# Patient Record
Sex: Male | Born: 1940 | Race: White | Hispanic: No | Smoking: Never smoker
Health system: Southern US, Community
[De-identification: ages and names within clinical notes are randomized; demographics above are authoritative.]

## PROBLEM LIST (undated history)

## (undated) DIAGNOSIS — C61 Malignant neoplasm of prostate: Secondary | ICD-10-CM

## (undated) DIAGNOSIS — J302 Other seasonal allergic rhinitis: Secondary | ICD-10-CM

## (undated) DIAGNOSIS — T7840XA Allergy, unspecified, initial encounter: Secondary | ICD-10-CM

## (undated) DIAGNOSIS — K219 Gastro-esophageal reflux disease without esophagitis: Secondary | ICD-10-CM

## (undated) DIAGNOSIS — I10 Essential (primary) hypertension: Secondary | ICD-10-CM

## (undated) DIAGNOSIS — F419 Anxiety disorder, unspecified: Secondary | ICD-10-CM

## (undated) DIAGNOSIS — K649 Unspecified hemorrhoids: Secondary | ICD-10-CM

## (undated) DIAGNOSIS — J329 Chronic sinusitis, unspecified: Secondary | ICD-10-CM

## (undated) DIAGNOSIS — R339 Retention of urine, unspecified: Secondary | ICD-10-CM

## (undated) DIAGNOSIS — E785 Hyperlipidemia, unspecified: Secondary | ICD-10-CM

## (undated) DIAGNOSIS — K5790 Diverticulosis of intestine, part unspecified, without perforation or abscess without bleeding: Secondary | ICD-10-CM

## (undated) HISTORY — DX: Gastro-esophageal reflux disease without esophagitis: K21.9

## (undated) HISTORY — DX: Unspecified hemorrhoids: K64.9

## (undated) HISTORY — DX: Essential (primary) hypertension: I10

## (undated) HISTORY — DX: Anxiety disorder, unspecified: F41.9

## (undated) HISTORY — DX: Hyperlipidemia, unspecified: E78.5

## (undated) HISTORY — DX: Malignant neoplasm of prostate: C61

## (undated) HISTORY — PX: PROSTATECTOMY: SHX69

## (undated) HISTORY — DX: Allergy, unspecified, initial encounter: T78.40XA

## (undated) HISTORY — DX: Chronic sinusitis, unspecified: J32.9

## (undated) HISTORY — DX: Retention of urine, unspecified: R33.9

## (undated) HISTORY — DX: Diverticulosis of intestine, part unspecified, without perforation or abscess without bleeding: K57.90

---

## 1997-11-06 ENCOUNTER — Ambulatory Visit: Admission: RE | Admit: 1997-11-06 | Discharge: 1997-11-06 | Payer: Self-pay | Admitting: *Deleted

## 1999-01-03 ENCOUNTER — Encounter: Payer: Self-pay | Admitting: *Deleted

## 1999-01-03 ENCOUNTER — Ambulatory Visit (HOSPITAL_COMMUNITY): Admission: RE | Admit: 1999-01-03 | Discharge: 1999-01-03 | Payer: Self-pay | Admitting: *Deleted

## 1999-05-13 ENCOUNTER — Ambulatory Visit: Admission: RE | Admit: 1999-05-13 | Discharge: 1999-05-13 | Payer: Self-pay | Admitting: *Deleted

## 2000-05-11 ENCOUNTER — Ambulatory Visit (HOSPITAL_COMMUNITY): Admission: RE | Admit: 2000-05-11 | Discharge: 2000-05-11 | Payer: Self-pay | Admitting: *Deleted

## 2001-05-10 ENCOUNTER — Ambulatory Visit: Admission: RE | Admit: 2001-05-10 | Discharge: 2001-08-08 | Payer: Self-pay | Admitting: *Deleted

## 2002-07-21 ENCOUNTER — Encounter (INDEPENDENT_AMBULATORY_CARE_PROVIDER_SITE_OTHER): Payer: Self-pay | Admitting: Specialist

## 2002-07-21 ENCOUNTER — Ambulatory Visit (HOSPITAL_COMMUNITY): Admission: RE | Admit: 2002-07-21 | Discharge: 2002-07-21 | Payer: Self-pay | Admitting: Gastroenterology

## 2003-04-16 ENCOUNTER — Ambulatory Visit (HOSPITAL_COMMUNITY): Admission: RE | Admit: 2003-04-16 | Discharge: 2003-04-16 | Payer: Self-pay | Admitting: Gastroenterology

## 2003-08-10 ENCOUNTER — Emergency Department (HOSPITAL_COMMUNITY): Admission: EM | Admit: 2003-08-10 | Discharge: 2003-08-10 | Payer: Self-pay | Admitting: Emergency Medicine

## 2004-02-08 ENCOUNTER — Emergency Department (HOSPITAL_COMMUNITY): Admission: EM | Admit: 2004-02-08 | Discharge: 2004-02-08 | Payer: Self-pay | Admitting: Emergency Medicine

## 2004-02-09 ENCOUNTER — Emergency Department (HOSPITAL_COMMUNITY): Admission: EM | Admit: 2004-02-09 | Discharge: 2004-02-09 | Payer: Self-pay | Admitting: *Deleted

## 2004-02-29 ENCOUNTER — Emergency Department (HOSPITAL_COMMUNITY): Admission: EM | Admit: 2004-02-29 | Discharge: 2004-02-29 | Payer: Self-pay | Admitting: Emergency Medicine

## 2004-03-28 ENCOUNTER — Ambulatory Visit (HOSPITAL_COMMUNITY): Admission: RE | Admit: 2004-03-28 | Discharge: 2004-03-28 | Payer: Self-pay | Admitting: Urology

## 2004-03-28 ENCOUNTER — Ambulatory Visit (HOSPITAL_BASED_OUTPATIENT_CLINIC_OR_DEPARTMENT_OTHER): Admission: RE | Admit: 2004-03-28 | Discharge: 2004-03-28 | Payer: Self-pay | Admitting: Urology

## 2004-04-15 ENCOUNTER — Emergency Department (HOSPITAL_COMMUNITY): Admission: EM | Admit: 2004-04-15 | Discharge: 2004-04-15 | Payer: Self-pay | Admitting: Emergency Medicine

## 2005-10-06 ENCOUNTER — Ambulatory Visit: Payer: Self-pay | Admitting: Family Medicine

## 2006-02-12 ENCOUNTER — Ambulatory Visit: Payer: Self-pay | Admitting: Family Medicine

## 2006-05-04 ENCOUNTER — Ambulatory Visit: Payer: Self-pay | Admitting: Family Medicine

## 2007-02-04 ENCOUNTER — Ambulatory Visit: Payer: Self-pay | Admitting: Family Medicine

## 2007-04-22 ENCOUNTER — Ambulatory Visit: Payer: Self-pay | Admitting: Family Medicine

## 2007-06-23 ENCOUNTER — Ambulatory Visit: Payer: Self-pay | Admitting: Family Medicine

## 2007-08-23 ENCOUNTER — Ambulatory Visit: Payer: Self-pay | Admitting: Family Medicine

## 2007-10-07 ENCOUNTER — Ambulatory Visit: Payer: Self-pay | Admitting: Family Medicine

## 2007-11-22 ENCOUNTER — Ambulatory Visit: Payer: Self-pay | Admitting: Family Medicine

## 2007-12-05 ENCOUNTER — Ambulatory Visit: Payer: Self-pay | Admitting: Family Medicine

## 2007-12-20 ENCOUNTER — Ambulatory Visit: Payer: Self-pay | Admitting: Family Medicine

## 2007-12-22 ENCOUNTER — Ambulatory Visit: Payer: Self-pay | Admitting: Family Medicine

## 2007-12-23 ENCOUNTER — Emergency Department (HOSPITAL_COMMUNITY): Admission: EM | Admit: 2007-12-23 | Discharge: 2007-12-23 | Payer: Self-pay | Admitting: Emergency Medicine

## 2008-01-02 ENCOUNTER — Ambulatory Visit: Payer: Self-pay | Admitting: Family Medicine

## 2008-01-17 ENCOUNTER — Ambulatory Visit: Payer: Self-pay | Admitting: Family Medicine

## 2008-01-31 ENCOUNTER — Ambulatory Visit: Payer: Self-pay | Admitting: Family Medicine

## 2008-02-05 ENCOUNTER — Emergency Department (HOSPITAL_COMMUNITY): Admission: EM | Admit: 2008-02-05 | Discharge: 2008-02-05 | Payer: Self-pay | Admitting: Emergency Medicine

## 2008-02-27 ENCOUNTER — Ambulatory Visit: Payer: Self-pay | Admitting: Family Medicine

## 2008-03-28 ENCOUNTER — Ambulatory Visit: Payer: Self-pay | Admitting: Family Medicine

## 2008-12-03 ENCOUNTER — Ambulatory Visit: Payer: Self-pay | Admitting: Family Medicine

## 2008-12-15 ENCOUNTER — Emergency Department (HOSPITAL_COMMUNITY): Admission: EM | Admit: 2008-12-15 | Discharge: 2008-12-15 | Payer: Self-pay | Admitting: Emergency Medicine

## 2008-12-19 ENCOUNTER — Ambulatory Visit: Payer: Self-pay | Admitting: Family Medicine

## 2009-02-07 ENCOUNTER — Ambulatory Visit: Payer: Self-pay | Admitting: Family Medicine

## 2010-01-21 ENCOUNTER — Ambulatory Visit: Payer: Self-pay | Admitting: Family Medicine

## 2010-05-13 ENCOUNTER — Ambulatory Visit
Admission: RE | Admit: 2010-05-13 | Discharge: 2010-05-13 | Payer: Self-pay | Source: Home / Self Care | Attending: Family Medicine | Admitting: Family Medicine

## 2010-07-26 LAB — CBC
Platelets: 233 10*3/uL (ref 150–400)
RBC: 4.71 MIL/uL (ref 4.22–5.81)
WBC: 10.7 10*3/uL — ABNORMAL HIGH (ref 4.0–10.5)

## 2010-07-26 LAB — BASIC METABOLIC PANEL
BUN: 17 mg/dL (ref 6–23)
CO2: 24 mEq/L (ref 19–32)
Calcium: 8.9 mg/dL (ref 8.4–10.5)
Chloride: 101 mEq/L (ref 96–112)
Creatinine, Ser: 0.78 mg/dL (ref 0.4–1.5)
GFR calc Af Amer: >60 mL/min (ref >60)
GFR calc non Af Amer: >60 mL/min (ref >60)
Glucose, Bld: 145 mg/dL — ABNORMAL HIGH (ref 70–99)
Potassium: 3.3 mEq/L — ABNORMAL LOW (ref 3.5–5.1)
Sodium: 135 mEq/L (ref 135–145)

## 2010-07-26 LAB — DIFFERENTIAL
Eosinophils Absolute: 0.1 10*3/uL (ref 0.0–0.7)
Lymphs Abs: 2.8 10*3/uL (ref 0.7–4.0)
Monocytes Relative: 7 % (ref 3–12)
Neutro Abs: 7 10*3/uL (ref 1.7–7.7)
Neutrophils Relative %: 66 % (ref 43–77)

## 2010-07-26 LAB — URINALYSIS, ROUTINE W REFLEX MICROSCOPIC
Bilirubin Urine: NEGATIVE
Ketones, ur: NEGATIVE mg/dL
Nitrite: NEGATIVE
Specific Gravity, Urine: 1.019 (ref 1.005–1.030)
Urobilinogen, UA: 0.2 mg/dL (ref 0.0–1.0)
pH: 5 (ref 5.0–8.0)

## 2010-07-26 LAB — PROTIME-INR
INR: 1 (ref 0.00–1.49)
Prothrombin Time: 13.1 seconds (ref 11.6–15.2)

## 2010-07-26 LAB — HEMOCCULT GUIAC POC 1CARD (OFFICE): Fecal Occult Bld: NEGATIVE

## 2010-07-30 IMAGING — CR DG CHEST 2V
2 series · 2 of 2 positions shown · non-contrast
Comparison: None

CLINICAL DATA: Weakness, chills, difficulty breathing

CHEST - 2 VIEW

[w chest pa]
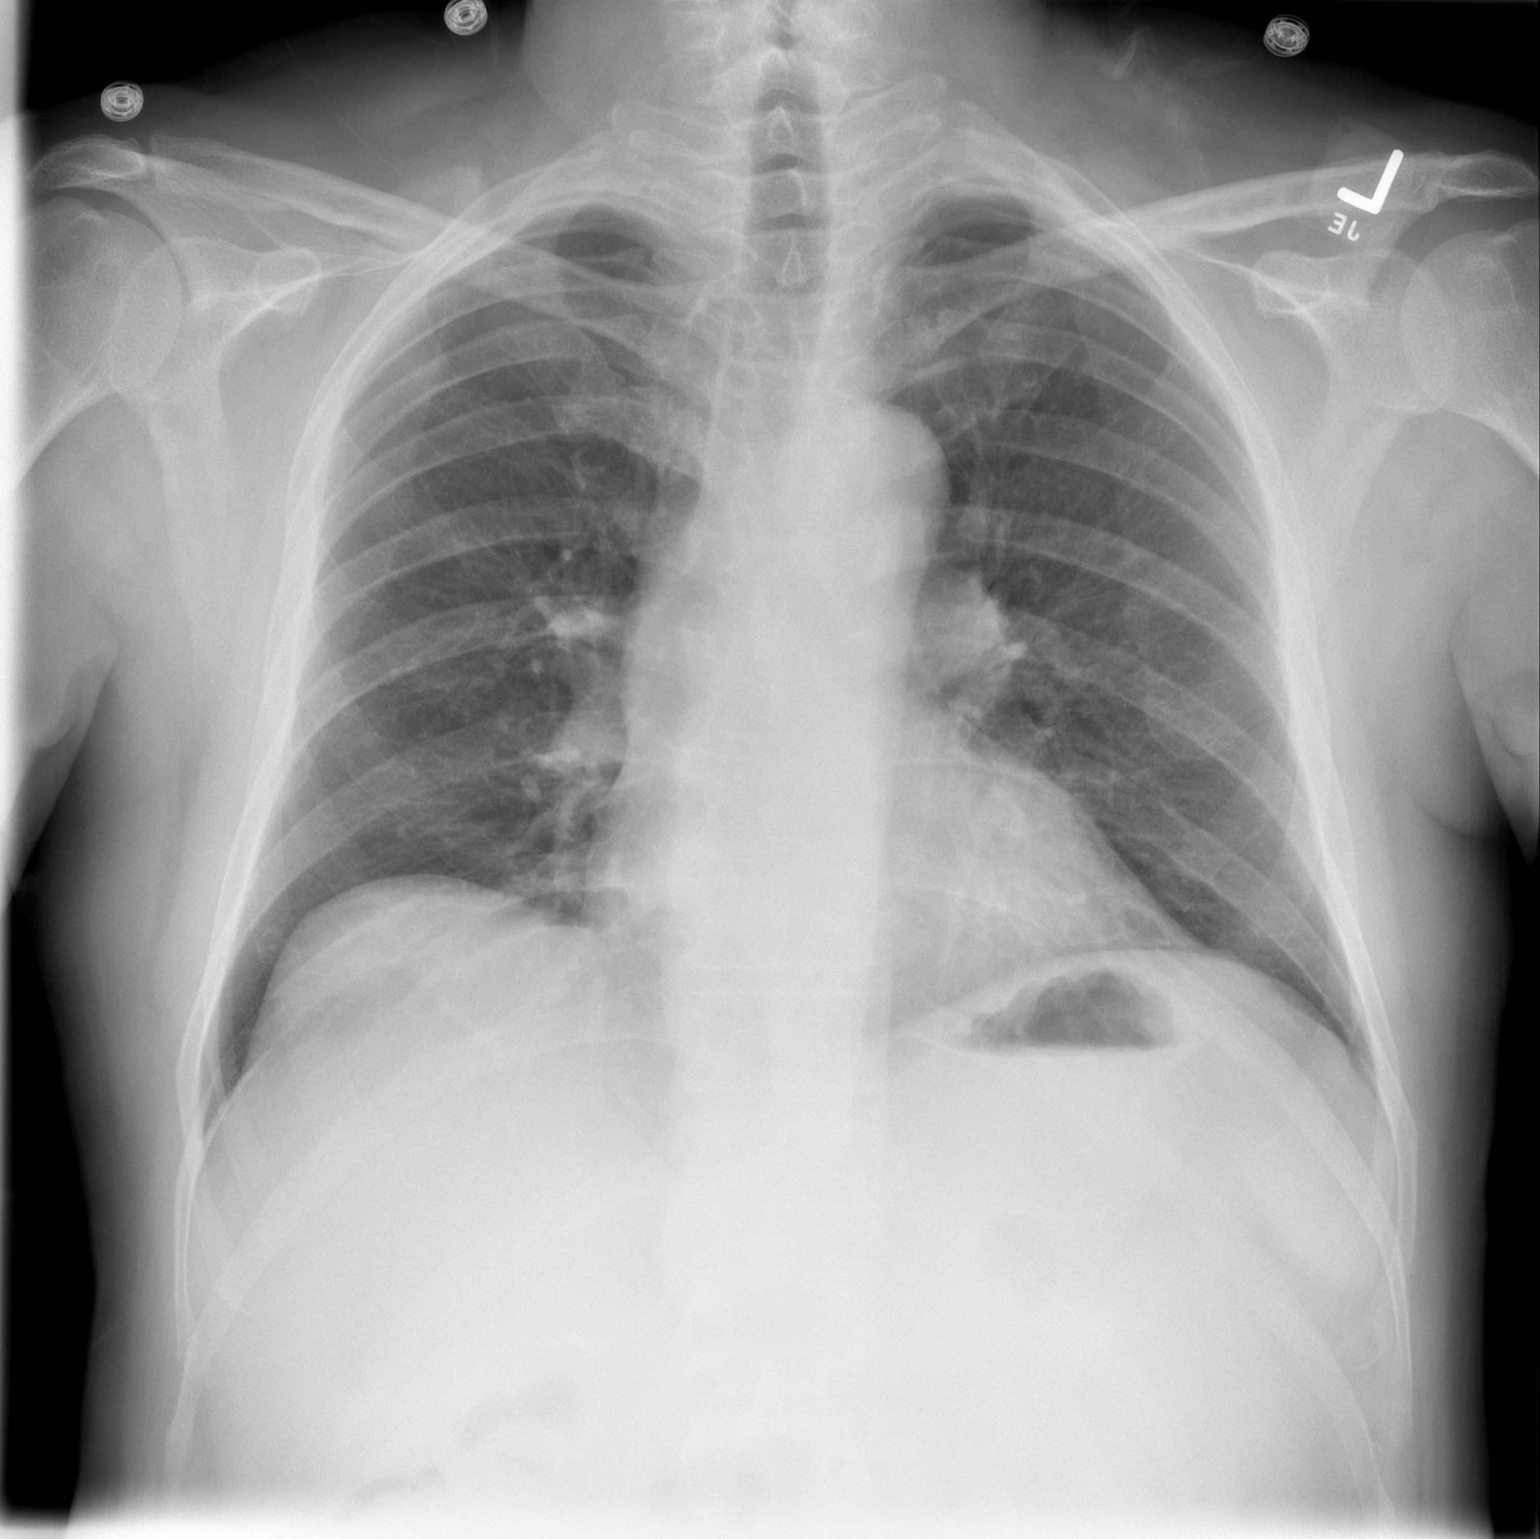

[w chest lat]
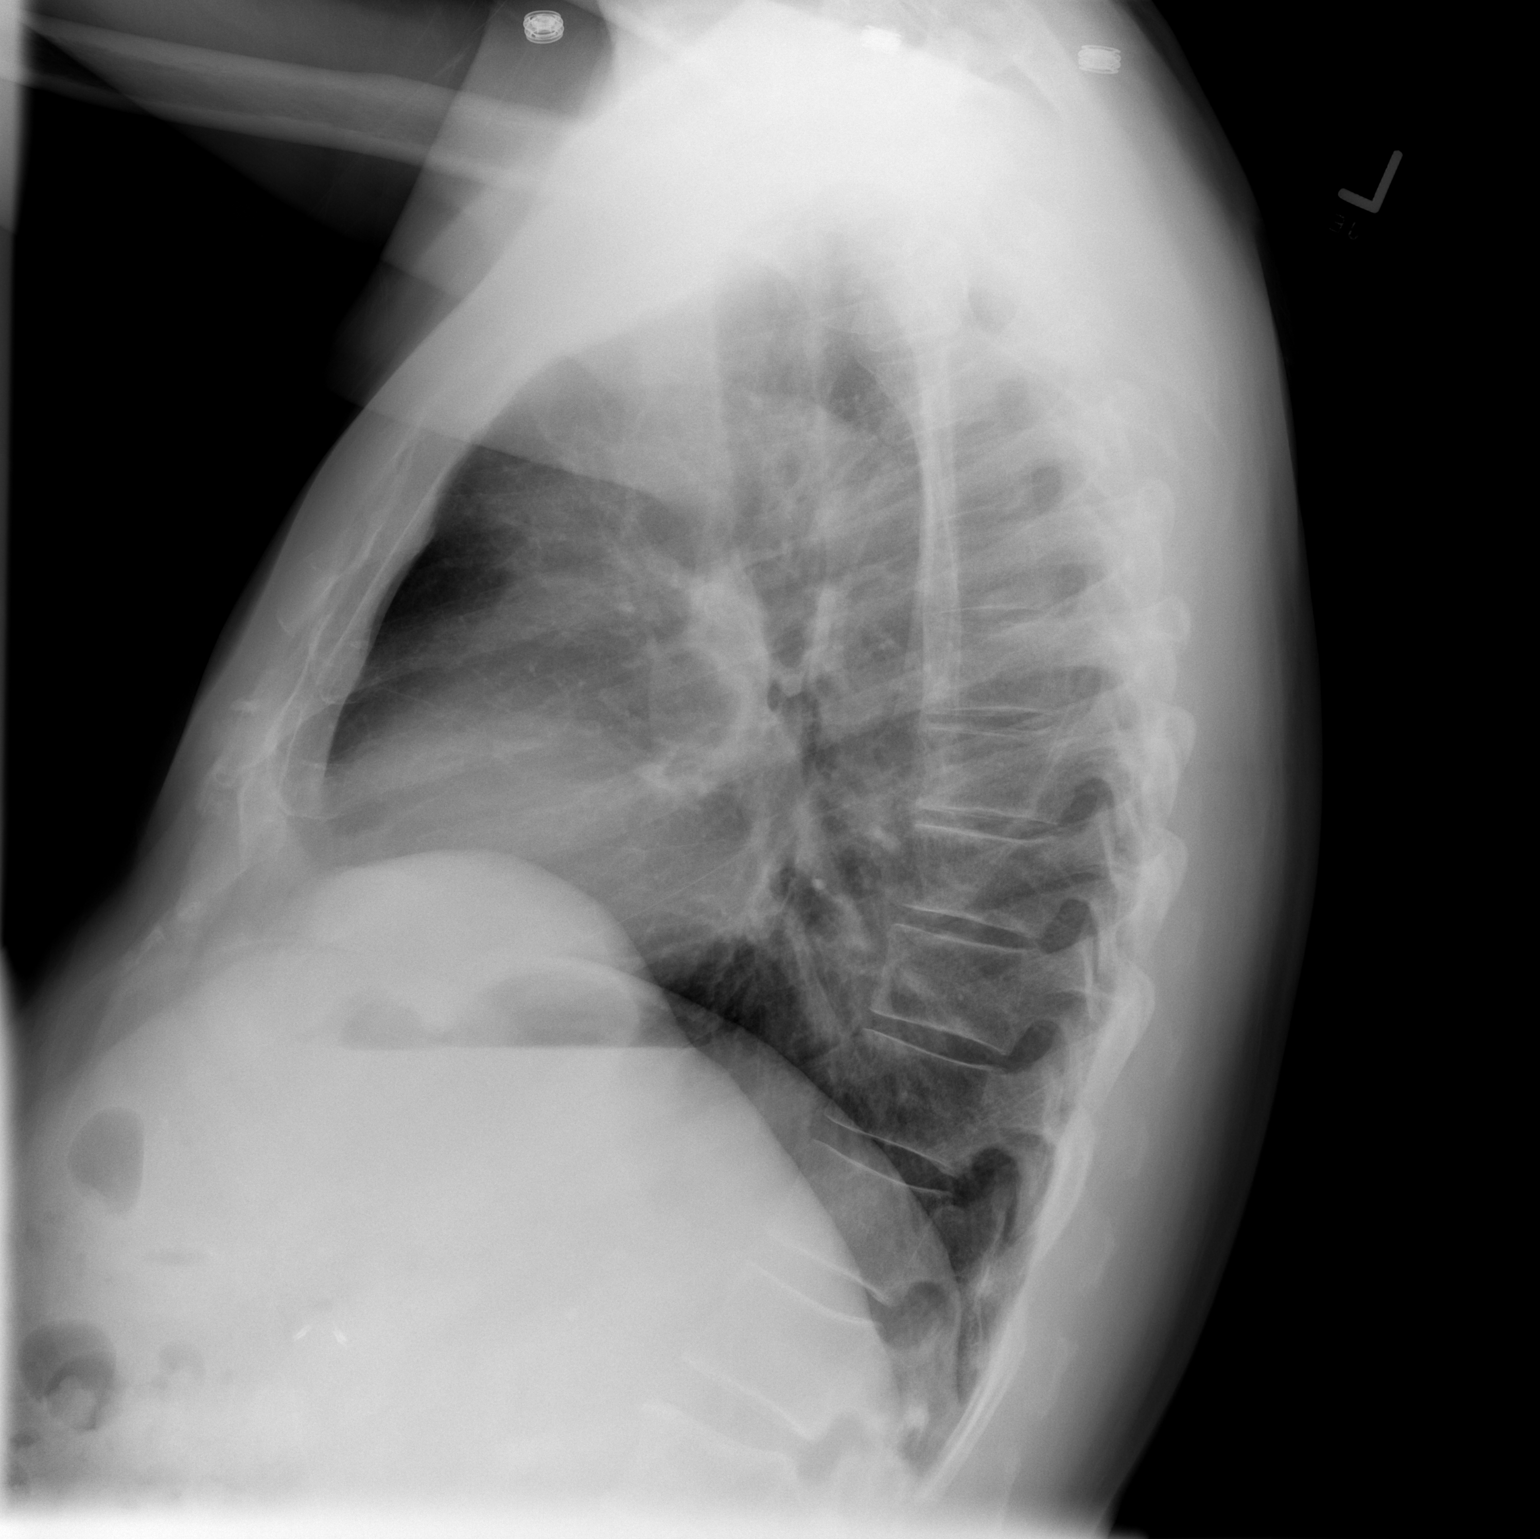

[2 of 2 positions shown; findings below may reference images not displayed]

FINDINGS: Two views of the chest show no active infiltrate or
effusion.  The heart is within normal limits in size.  No bony
abnormality is seen.
IMPRESSION: No active lung disease.

## 2010-09-05 NOTE — Op Note (Signed)
NAME:  Matthew Mckay, Matthew Mckay NO.:  [id number]   MEDICAL RECORD NO.:  [id number]          PATIENT TYPE:  AMB   LOCATION:  [facility]                         FACILITY:  [facility]   PHYSICIAN:  Lindaann Slough, M.D.  DATE OF BIRTH:  06-20-1940   DATE OF PROCEDURE:  03/28/2004  DATE OF DISCHARGE:                                 OPERATIVE REPORT   PREOPERATIVE. DIAGNOSES:  Urethral stricture and carcinoma of prostate.   POSTOPERATIVE DIAGNOSES:  Urethral stricture and carcinoma of prostate.   PROCEDURE:  Cystoscopy, urethral dilation.   SURGEON:  Lindaann Slough, M.D.   ANESTHESIA:  General.   INDICATIONS FOR PROCEDURE:  The patient is a 70 year old male who was  treated with radiation for adenocarcinoma of prostate in 78 in [place].  He was seen in the emergency room on February 09, 2004 in urinary retention.  He was then found to have a urethral stricture.  The stricture was then  dilated and the Foley catheter was left in place.  After removing the Foley  catheter, he started having difficulty voiding and catheter had to be  reinserted.  He is scheduled today for cystoscopy and visual urethrotomy.   Under general anesthesia, the patient was prepped and draped and placed in  the dorsal lithotomy position.  A #22 Wappler cystoscope was inserted in the  bladder.  The patient had a stricture in the bulbous urethra but the  cystoscope could be passed in the bladder. The patient had a previous TURP  and the bladder neck is open. The bladder is moderately trabeculated.  There  is no stone or tumor in the bladder.  The ureteral orifices are in normal  position and shape with clear efflux.  The cystoscope was then removed. The  urethra was dilated with Sissy Hoff sounds up to #28 [place] and then a #22  [place] Foley catheter was inserted without difficulty in the bladder.   The patient tolerated the procedure well and left the OR in satisfactory  condition to post anesthesia care  unit.      MN/MEDQ  D:  03/28/2004  T:  03/28/2004  Job:  [id number]   cc:   Sharlot Gowda, M.D.  [address]  [place], [place] [address]  Fax: [phone]

## 2010-09-05 NOTE — Consult Note (Signed)
NAME:  Matthew Mckay, Matthew Mckay NO.:  [id number]   MEDICAL RECORD NO.:  [id number]          PATIENT TYPE:  EMS   LOCATION:  [facility]                         FACILITY:  [facility]   PHYSICIAN:  Lindaann Slough, M.D.  DATE OF BIRTH:  1940/05/09   DATE OF CONSULTATION:  DATE OF DISCHARGE:  02/09/2004                                   CONSULTATION   DATE OF CONSULTATION:  February 09, 2004.   REASON FOR CONSULTATION:  Inability to insert a Foley catheter.   HISTORY OF PRESENT ILLNESS:  The patient is a 70 year old male, who had been  having difficulty voiding for the past few days.  He was seen in the ER last  night and sent home with an SMX/TMP and Pyridium for a urinary tract  infection.  However, he continued to have dribbling and the sensation of  incomplete emptying of the bladder, and he returned to the emergency room.  He was found on physical examination to have a distended bladder.  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  However, they were able to pass a #14 Foley toward the  meatus, but could not pass it in the bladder.  I was then asked to come in  and evaluate the patient.   PAST MEDICAL HISTORY:  Radical prostatectomy about 10 years ago.   I dilated the meatus with Sissy Hoff sounds up to #18 [place] and then I  passed a #16 Coude catheter in the urethra, but there was obstruction at the  bladder neck.  Then, I passed a flexible cystoscope through the meatus.  There was a stricture at the bladder neck, and I passed a guidewire toward  the cystoscope and toward the stricture into the bladder.  The cystoscope  was then removed.  I dilated the bladder neck with Hayman's dilators up to  #18 [place].  Then, a #16 Ainsworth catheter was passed over the guidewire.  700 mL of urine were then drained out of the bladder.  The catheter was left  indwelling.   IMPRESSION:  1.  Urinary retention.  2.  Bladder neck  contracture.  3.  Status post radicle prostatectomy for adenocarcinoma of prostate      followed by external beam radiotherapy.   PLAN:  Leave Foley catheter indwelling.  Continue SMX/TMP and follow up next  week for voiding trial.       MN/MEDQ  D:  02/09/2004  T:  02/09/2004  Job:  [id number]

## 2010-09-05 NOTE — Op Note (Signed)
NAME:  Matthew Mckay, Matthew Mckay NO.:  [id number]   MEDICAL RECORD NO.:  [id number]                   PATIENT TYPE:  AMB   LOCATION:  ENDO                                 FACILITY:  [facility]   PHYSICIAN:  Petra Kuba, M.D.                 DATE OF BIRTH:  1940/12/01   DATE OF PROCEDURE:  07/21/2002  DATE OF DISCHARGE:                                 OPERATIVE REPORT   PROCEDURE:  Colonoscopy with biopsy.   INDICATIONS FOR PROCEDURE:  Bright red blood per rectum. Patient due for  colonic screening.   Consent was signed after risks, benefits, methods, and options were  thoroughly discussed in the office.   MEDICINES USED:  Demerol 100, Versed 12.   DESCRIPTION OF PROCEDURE:  Rectal inspection was pertinent for small  external hemorrhoids. Digital exam was negative. The video colonoscope was  inserted, easily advanced around the colon to the cecum. This did require  some abdominal pressure but  no position changes. On insertion, some left  sided diverticula were seen but no other abnormalities. The cecum was  identified by the appendiceal orifice and the ileocecal valve. The prep was  fairly adequate, did require some washing and suctioning for adequate  visualization.  The scope was slowly withdrawn. The cecum, ascending and  transverse were all normal. The scope was withdrawn around the left side of  the colon, some left sided diverticula were seen. In the mid sigmoid, an  edematous fold was seen. We did not think it was a polyp but went ahead and  took two cold biopsies of it just to make sure. In the distal sigmoid, a  tiny polyp was seen and was cold biopsied x2. The specimens were put in a  separate container. Once back in the rectum, the scope was retroflexed  pertinent for some small internal hemorrhoids, some scarring from previous  hemorrhoidal surgery as well as some minimal radiation proctitis. Anal  rectal pull through straight visualization of the  rectum confirmed the  above. The scope was then straightened and readvanced a short ways up the  left side of the colon, air was suctioned, scope removed. The patient  tolerated the procedure well. There was no obvious or immediate  complications.   ENDOSCOPIC DIAGNOSIS:  1. Internal and external hemorrhoids.  2. Minimal radiation proctitis.  3. Left sided diverticula.  4. Tiny distal sigmoid polyp cold biopsied.  5. Sigmoid edema doubt polyp status post cold biopsied.  6. Otherwise within normal limits to the cecum.    PLAN:  Await pathology and would probably recheck colon screening in five  years. Happy to see back p.r.n. or in two months to recheck symptoms and  make sure no further workup plans are needed. If bleeding picks up and  suppositories are unhelpful might even consider a trial of using the ERBE  argon tissue plasma coagulator, but his radiation damage  does not seem bad  enough to warrant that at the current time.                                               Petra Kuba, M.D.    MEM/MEDQ  D:  07/21/2002  T:  07/22/2002  Job:  [id number]   cc:   Sharlot Gowda, M.D.  [address] [place], [place] [address]  Fax: [phone]

## 2010-09-17 ENCOUNTER — Other Ambulatory Visit: Payer: Self-pay | Admitting: Family Medicine

## 2011-01-20 LAB — DIFFERENTIAL
Basophils Absolute: 0.1
Basophils Relative: 1
Lymphocytes Relative: 24
Neutro Abs: 9.1 — ABNORMAL HIGH
Neutrophils Relative %: 71

## 2011-01-20 LAB — POCT I-STAT, CHEM 8
BUN: 12
Chloride: 103
HCT: 47
Potassium: 3.6

## 2011-01-20 LAB — CBC
Hemoglobin: 15.6
MCHC: 33.1
RBC: 5.32
RDW: 13

## 2011-01-21 LAB — URINALYSIS, ROUTINE W REFLEX MICROSCOPIC
Glucose, UA: NEGATIVE
Nitrite: NEGATIVE
Specific Gravity, Urine: 1.018
pH: 5.5

## 2011-01-21 LAB — CBC
HCT: 44.7
Hemoglobin: 15.3
MCHC: 34.2
MCV: 87.5
Platelets: 239
RBC: 5.11
RDW: 12.8
WBC: 13.2 — ABNORMAL HIGH

## 2011-01-21 LAB — POCT I-STAT, CHEM 8
BUN: 16
Calcium, Ion: 1.17
Chloride: 102
Creatinine, Ser: 0.9
Glucose, Bld: 96
HCT: 46
Hemoglobin: 15.6
Potassium: 3.7
Sodium: 136
TCO2: 27

## 2011-01-21 LAB — DIFFERENTIAL
Basophils Absolute: 0
Lymphocytes Relative: 29
Lymphs Abs: 3.8
Monocytes Absolute: 1
Neutro Abs: 8.4 — ABNORMAL HIGH

## 2011-01-21 LAB — OCCULT BLOOD X 1 CARD TO LAB, STOOL: Fecal Occult Bld: NEGATIVE

## 2011-03-27 ENCOUNTER — Encounter: Payer: Self-pay | Admitting: Medical

## 2011-03-27 ENCOUNTER — Other Ambulatory Visit: Payer: Self-pay

## 2011-03-27 ENCOUNTER — Ambulatory Visit (INDEPENDENT_AMBULATORY_CARE_PROVIDER_SITE_OTHER): Payer: Medicare Other | Admitting: Medical

## 2011-03-27 VITALS — BP 132/80 | HR 62 | Temp 98.2°F | Resp 16 | Wt 190.0 lb

## 2011-03-27 DIAGNOSIS — J329 Chronic sinusitis, unspecified: Secondary | ICD-10-CM | POA: Insufficient documentation

## 2011-03-27 DIAGNOSIS — Z23 Encounter for immunization: Secondary | ICD-10-CM | POA: Insufficient documentation

## 2011-03-27 DIAGNOSIS — M7730 Calcaneal spur, unspecified foot: Secondary | ICD-10-CM

## 2011-03-27 DIAGNOSIS — J309 Allergic rhinitis, unspecified: Secondary | ICD-10-CM | POA: Insufficient documentation

## 2011-03-27 DIAGNOSIS — H1010 Acute atopic conjunctivitis, unspecified eye: Secondary | ICD-10-CM

## 2011-03-27 DIAGNOSIS — H1045 Other chronic allergic conjunctivitis: Secondary | ICD-10-CM

## 2011-03-27 MED ORDER — AMOXICILLIN 875 MG PO TABS: 875.0000 mg | ORAL_TABLET | Freq: Two times a day (BID) | ORAL | Status: AC

## 2011-03-27 MED ORDER — OLOPATADINE HCL 0.2 % OP SOLN: 1.0000 [drp] | Freq: Every day | OPHTHALMIC | Status: DC

## 2011-03-27 NOTE — Progress Notes (Signed)
Subjective:   HPI  Matthew Mckay is a 70 y.o. male who presents with sinus symptoms.   He reports sick contacts with flu and bronchitis.  He notes left eye with redness, itching for weeks, runny nose for weeks, gets some vertigo due to allergy symptoms and ear fullness.  He feels post nasal drip.  Thinks he may have sinus infection, has had worsening mucous, sinus pressure and congestion compared to normal for over a week.  He notes hx/o sinus and allergy problems.  Currently using eye drops for itching.   Using nothing else for symptoms.   He notes that the nasal drainage and mucous in his throat is aggravating his reflux.   Nose is very runny.  No other aggravating or relieving factors.  He would also like repeat pneumococcal and flu vaccine.   Last pneumococcal vaccine 10 years ago.   He is not UTD on flu shot.  He reports pain in left heel with standing or pressure.  Has had heel spur in the past and this feels similar . He bought some heel cups recently a month ago which hasn't helped.  He notes that the pain worsening when he switched to waterproof shoe.  He works outside all day at the [employer].  No other c/o.  The following portions of the patient's history were reviewed and updated as appropriate: allergies, current medications, past family history, past medical history, past social history, past surgical history and problem list.  Encounter Diagnoses  Name Primary?  . Sinusitis Yes  . Allergic rhinitis   . Allergic conjunctivitis   . Need for influenza vaccination   . Need for pneumococcal vaccination   . Heel spur    Review of Systems Constitutional: +fever, -chills, -sweats, -unexpected -weight change,-fatigue ENT: +runny nose, -ear pain, +sore throat Cardiology:  -chest pain, -palpitations, -edema Respiratory: +cough, -shortness of breath, -wheezing Gastroenterology: -abdominal pain, -nausea, -vomiting, -diarrhea, -constipation Hematology: -bleeding or bruising  problems Musculoskeletal: -arthralgias, -myalgias, -joint swelling, -back pain Ophthalmology: -vision changes Urology: -dysuria, -difficulty urinating, -hematuria, -urinary frequency, -urgency Neurology: -headache, -weakness, -tingling, -numbness   Objective:   Filed Vitals:   03/27/11 0935  BP: 132/80  Pulse: 62  Temp: 98.2 F (36.8 C)  Resp: 16    General appearance: Alert, WD/WN, no distress                             Skin: warm, no rash                           Head: + mild frontal sinus tenderness,                            Eyes: left conjunctiva injected, but no purulent drainage, no eyelid swelling, no crusting, otherwise conjunctiva normal, corneas clear, PERRLA                            Ears: pearly TMs, external ear canals normal                          Nose: septum midline, turbinates swollen, with erythema and clear discharge             Mouth/throat: MMM, tongue normal, mild pharyngeal erythema  Neck: supple, no adenopathy, no thyromegaly, nontender                          Heart: RRR, normal S1, S2, no murmurs                         Lungs: CTA bilaterally, no wheezes, rales, or rhonchi MSK: mild inferior and posterior heel tenderness on the left      Assessment and Plan:   Encounter Diagnoses  Name Primary?  . Sinusitis Yes  . Allergic rhinitis   . Allergic conjunctivitis   . Need for influenza vaccination   . Need for pneumococcal vaccination   . Heel spur     Sinusitis - prescription given for Amoxicillin..  Can use OTC Mucinex DM for congestion.  Tylenol or Ibuprofen OTC for fever and malaise.  Discussed symptomatic relief, nasal saline, and call or return if worse or not improving in 4-5 days.     Allergic rhinitis - begin Zyrtec  Allergic conjunctivitis - begin Pataday drops  Heel spur - if worsening despite trying a different heel cup, we can consider referral to podiatry.  Vaccine counseling, VIS given, and flu and  pneumococcal vaccines given.

## 2011-03-27 NOTE — Patient Instructions (Signed)
Begin Zyrtec 10mg  daily at bedtime for allergies.  Consider OTC Mucinex DM for cough and congestion.    Begin Amoxicillin 875mg  twice daily for sinus infection.    Begin Pataday eye drops daily for allergies in the left eye.  Call if not improving or worse.  Sinusitis Sinuses are air pockets within the bones of your face. The growth of bacteria within a sinus leads to infection. Infection keeps the sinuses from draining. This infection is called sinusitis. SYMPTOMS There will be different areas of pain depending on which sinuses have become infected.  The maxillary sinuses often produce pain beneath the eyes.   Frontal sinusitis may cause pain in the middle of the forehead and above the eyes.  Other problems (symptoms) include:  Toothaches.   Colored, pus-like (purulent) drainage from the nose.   Any swelling, warmth, or tenderness over the sinus areas may be signs of infection.  TREATMENT Sinusitis is most often determined by an exam and you may have x-rays taken. If x-rays have been taken, make sure you obtain your results. Or find out how you are to obtain them. Your caregiver may give you medications (antibiotics). These are medications that will help kill the infection. You may also be given a medication (decongestant) that helps to reduce sinus swelling.  HOME CARE INSTRUCTIONS  Only take over-the-counter or prescription medicines for pain, discomfort, or fever as directed by your caregiver.   Drink extra fluids. Fluids help thin the mucus so your sinuses can drain more easily.   Applying either moist heat or ice packs to the sinus areas may help relieve discomfort.   Use saline nasal sprays to help moisten your sinuses. The sprays can be found at your local drugstore.  SEEK IMMEDIATE MEDICAL CARE IF YOU DEVELOP:  High fever that is still present after two days of antibiotic treatment.   Increasing pain, severe headaches, or toothache.   Nausea, vomiting, or drowsiness.     Unusual swelling around the face or trouble seeing.  MAKE SURE YOU:   Understand these instructions.   Will watch your condition.   Will get help right away if you are not doing well or get worse.  Document Released: 04/06/2005 Document Re-Released: 03/19/2008 [facility] Patient Information 2011 [place], [place].

## 2011-04-15 ENCOUNTER — Other Ambulatory Visit: Payer: Self-pay | Admitting: Family Medicine

## 2011-04-23 ENCOUNTER — Telehealth: Payer: Self-pay | Admitting: Internal Medicine

## 2011-04-23 NOTE — Telephone Encounter (Signed)
Faxed note back stating this

## 2011-04-23 NOTE — Telephone Encounter (Signed)
He probably got this from his ophthalmologist and needs to check with that doctor

## 2011-05-14 ENCOUNTER — Other Ambulatory Visit: Payer: Self-pay | Admitting: Family Medicine

## 2011-10-16 ENCOUNTER — Ambulatory Visit (INDEPENDENT_AMBULATORY_CARE_PROVIDER_SITE_OTHER): Payer: Medicare Other | Admitting: Medical

## 2011-10-16 ENCOUNTER — Encounter: Payer: Self-pay | Admitting: Medical

## 2011-10-16 VITALS — BP 130/80 | HR 80 | Temp 98.4°F | Resp 16 | Wt 186.0 lb

## 2011-10-16 DIAGNOSIS — J329 Chronic sinusitis, unspecified: Secondary | ICD-10-CM

## 2011-10-16 DIAGNOSIS — M542 Cervicalgia: Secondary | ICD-10-CM

## 2011-10-16 DIAGNOSIS — J029 Acute pharyngitis, unspecified: Secondary | ICD-10-CM

## 2011-10-16 MED ORDER — AMOXICILLIN 875 MG PO TABS: 875.0000 mg | ORAL_TABLET | Freq: Two times a day (BID) | ORAL | Status: AC

## 2011-10-16 MED ORDER — MOMETASONE FUROATE 50 MCG/ACT NA SUSP: 2.0000 | Freq: Every day | NASAL | Status: DC

## 2011-10-16 NOTE — Progress Notes (Signed)
Subjective:  Matthew Mckay is a 71 y.o. male who presents for 1 wk hx/o sinus pressure, right ear pressure, post nasal drip, low grade fever, and now has bad sore throat.  In the past when the sore throat starts he usually comes down with sinus infection.  Using Zyrtec generic from [facility].   He sees allergist, Dr. Otelia Sergeant.  Gets allergy shots.  Needs Nasonex refilled today.  He does c/o right side neck discomfort like a crick in the neck.   Sleeps on that side.  Denies sick contacts.  No other aggravating or relieving factors.  No other c/o.   Objective:   Filed Vitals:   10/16/11 1121  BP: 130/80  Pulse: 80  Temp: 98.4 F (36.9 C)  Resp: 16    General appearance: Alert, WD/WN, no distress                             Skin: warm, no rash                           Head: + mild right sinus tenderness,                            Eyes: conjunctiva normal, corneas clear, PERRLA                            Ears: pearly TMs, external ear canals normal                          Nose: septum midline, turbinates swollen, with erythema and clear discharge             Mouth/throat: MMM, tongue normal, mild pharyngeal erythema                           Neck: supple, no adenopathy, no thyromegaly, nontender, normal ROM                          Heart: RRR, normal S1, S2, no murmurs                         Lungs: CTA bilaterally, no wheezes, rales, or rhonchi      Assessment and Plan:   Encounter Diagnoses  Name Primary?  . Sinusitis Yes  . Pharyngitis   . Cervicalgia    Sinusitis and pharyngitis - Prescription given for Amoxicillin.  Refilled his Nasonex.  Can use OTC Mucinex for congestion.  Tylenol or Ibuprofen OTC for fever and malaise.  Discussed symptomatic relief, nasal saline, and call or return if worse or not improving in 2-3 days.     Cervicalgia - heat, massage, OTC Aleve and if not improving by next week, we can consider muscle relaxer prn use.

## 2011-11-11 ENCOUNTER — Other Ambulatory Visit: Payer: Self-pay | Admitting: Family Medicine

## 2012-01-20 ENCOUNTER — Encounter: Payer: Self-pay | Admitting: Medical

## 2012-01-20 ENCOUNTER — Ambulatory Visit (INDEPENDENT_AMBULATORY_CARE_PROVIDER_SITE_OTHER): Payer: Medicare Other | Admitting: Medical

## 2012-01-20 ENCOUNTER — Telehealth: Payer: Self-pay | Admitting: Medical

## 2012-01-20 VITALS — BP 120/70 | HR 92 | Temp 98.0°F | Resp 16 | Wt 183.0 lb

## 2012-01-20 DIAGNOSIS — M549 Dorsalgia, unspecified: Secondary | ICD-10-CM

## 2012-01-20 DIAGNOSIS — R109 Unspecified abdominal pain: Secondary | ICD-10-CM

## 2012-01-20 DIAGNOSIS — R21 Rash and other nonspecific skin eruption: Secondary | ICD-10-CM

## 2012-01-20 LAB — POCT URINALYSIS DIPSTICK
Glucose, UA: NEGATIVE
Nitrite, UA: NEGATIVE
Spec Grav, UA: 1.02
Urobilinogen, UA: NEGATIVE

## 2012-01-20 MED ORDER — CYCLOBENZAPRINE HCL 5 MG PO TABS: ORAL_TABLET | ORAL | Status: DC

## 2012-01-20 MED ORDER — DICLOFENAC SODIUM 75 MG PO TBEC: 75.0000 mg | DELAYED_RELEASE_TABLET | Freq: Two times a day (BID) | ORAL | Status: DC

## 2012-01-20 NOTE — Progress Notes (Signed)
Subjective: Here for c/o back pain.  Was pulling weeds the other day, bent over a lot, going up and down.  Then later that day has low back pain and spasm.  Started Vicks vapor rub, felt like he over done it physically.  Has seen chiropractor years ago for upper back spasms and pain.  This morning started getting some pain in right lower abdomen when he bent over to put shoes on.  Thinks this is a muscle spasm too.   Couldn't sleep last night due to the pain.  Used some 1 tylenol pain killer.  He does report on and off tenderness in right testicle at times.  Has remote hx/o prostate cancer and surgery to remove the prostate, was followed by urology for years.  No bowel or bladder changes.  No urine stream changes, no penile discharge, no blood in stool or urine.  He also reports bug bite on left thigh.  Just noticed this 2-3 days ago, but denies trauma.  Thinks a mosquito may have bit him through his pants.  Using nothing for the rash.  Past Medical History  Diagnosis Date  . Hypertension   . Prostate cancer   . Allergy   . GERD (gastroesophageal reflux disease)   . Anxiety   . Hemorrhoid    Review of Systems Constitutional: -fever, -chills, -sweats, -unexpected -weight change,-fatigue ENT: -runny nose, -ear pain, -sore throat Cardiology:  -chest pain, -palpitations, -edema Respiratory: -cough, -shortness of breath, -wheezing Gastroenterology: -abdominal pain, -nausea, -vomiting, -diarrhea, -constipation Hematology: -bleeding or bruising problems Musculoskeletal: -arthralgias, -myalgias, -joint swelling, -back pain Ophthalmology: -vision changes Urology: -dysuria, -difficulty urinating, -hematuria, -urinary frequency, -urgency Neurology: -headache, -weakness, -tingling, -numbness      Objective:   Physical Exam  Filed Vitals:   01/20/12 1153  BP: 120/70  Pulse: 92  Temp: 98 F (36.7 C)  Resp: 16    General appearance: alert, no distress, WD/WN  Skin: left lateral thigh with  amorphous patch of flat purplish/pink coloration approx 3cm x 2 cm, suggestive of ecchymosis Abdomen: +bs, soft, mild tenderness lower abdomen throughout, non distended, no masses, no hepatomegaly, no splenomegaly Back: mild lumbar midline tenderness, no paraspinal tenderness, normal ROM, but pain in right lower abdomen and low back with flexion of hips Pulses: 2+ symmetric MSK: legs nontender, normal ROM Neuro: normal LE strength and sensation and DTRs, -SLR GU: testicles nontender, no mass, no penile lesions, no hernia  Assessment and Plan :    Encounter Diagnoses  Name Primary?  . Back pain Yes  . Abdominal pain   . Rash    Reviewed urinalysis.     Will treat for muscle spasm/mild strain.  Scripts for Flexeril and Diclofenac, caution with sedation on Flexeril.  Advised no heavy lifting, gentle ROM and stretching.  If not better in 1wk, recheck.  Rash- advised if it didn't resolve in 1wk to recheck.  Advised routine labs and general f/u soon.  Last labs/physical > 1year.

## 2012-01-20 NOTE — Telephone Encounter (Signed)
LM

## 2012-01-29 ENCOUNTER — Encounter: Payer: Self-pay | Admitting: Medical

## 2012-01-29 ENCOUNTER — Ambulatory Visit (INDEPENDENT_AMBULATORY_CARE_PROVIDER_SITE_OTHER): Payer: Medicare Other | Admitting: Medical

## 2012-01-29 VITALS — BP 150/80 | HR 68 | Temp 97.5°F | Resp 16 | Wt 183.0 lb

## 2012-01-29 DIAGNOSIS — I10 Essential (primary) hypertension: Secondary | ICD-10-CM

## 2012-01-29 DIAGNOSIS — M549 Dorsalgia, unspecified: Secondary | ICD-10-CM

## 2012-01-29 DIAGNOSIS — R21 Rash and other nonspecific skin eruption: Secondary | ICD-10-CM

## 2012-01-29 DIAGNOSIS — Z23 Encounter for immunization: Secondary | ICD-10-CM

## 2012-01-29 DIAGNOSIS — Z8546 Personal history of malignant neoplasm of prostate: Secondary | ICD-10-CM

## 2012-01-29 DIAGNOSIS — R350 Frequency of micturition: Secondary | ICD-10-CM

## 2012-01-29 LAB — CBC WITH DIFFERENTIAL/PLATELET
Basophils Absolute: 0 K/uL (ref 0.0–0.1)
Basophils Relative: 0 % (ref 0–1)
Eosinophils Absolute: 0.3 K/uL (ref 0.0–0.7)
Eosinophils Relative: 3 % (ref 0–5)
HCT: 43 % (ref 39.0–52.0)
Hemoglobin: 14.8 g/dL (ref 13.0–17.0)
Lymphocytes Relative: 42 % (ref 12–46)
Lymphs Abs: 3.8 K/uL (ref 0.7–4.0)
MCH: 29.1 pg (ref 26.0–34.0)
MCHC: 34.4 g/dL (ref 30.0–36.0)
MCV: 84.6 fL (ref 78.0–100.0)
Monocytes Absolute: 0.7 K/uL (ref 0.1–1.0)
Monocytes Relative: 8 % (ref 3–12)
Neutro Abs: 4.2 K/uL (ref 1.7–7.7)
Neutrophils Relative %: 47 % (ref 43–77)
Platelets: 280 K/uL (ref 150–400)
RBC: 5.08 MIL/uL (ref 4.22–5.81)
RDW: 13 % (ref 11.5–15.5)
WBC: 8.9 K/uL (ref 4.0–10.5)

## 2012-01-29 MED ORDER — INFLUENZA VIRUS VACC SPLIT PF IM SUSP: 0.5000 mL | Freq: Once | INTRAMUSCULAR | Status: DC

## 2012-01-29 NOTE — Progress Notes (Signed)
Subjective:   HPI  Matthew Mckay is a 71 y.o. male who presents for 1 wk recheck.  He was also suppose to be here for physical, but was listed as recheck.  He is here for labs and "checkup."  He is compliant with medication for blood pressure.  He notes that the rash and back pain have both resolved since last visit.  No new problems.  He is non fasting today.  He has hx/o prostate cancer, use to see urology at least yearly, but no recent Urology f/u.   No other c/o.  The following portions of the patient's history were reviewed and updated as appropriate: allergies, current medications, past family history, past medical history, past social history, past surgical history and problem list.  Past Medical History  Diagnosis Date  . Hypertension   . Allergy   . GERD (gastroesophageal reflux disease)   . Anxiety   . Hemorrhoid   . Prostate cancer     history of radiation therapy    No Known Allergies   Review of Systems ROS reviewed and was negative other than noted in HPI or above.    Objective:   Physical Exam  General appearance: alert, no distress, WD/WN HEENT: normocephalic, sclerae anicteric, TMs pearly, nares patent, no discharge or erythema, pharynx normal Oral cavity: MMM, no lesions Neck: supple, no lymphadenopathy, no thyromegaly, no masses Heart: RRR, normal S1, S2, no murmurs Lungs: CTA bilaterally, no wheezes, rhonchi, or rales Abdomen: +bs, soft, non tender, non distended, no masses, no hepatomegaly, no splenomegaly Pulses: 2+ symmetric, upper and lower extremities, normal cap refill Rectal: anus with small external hemorrhoids, tone normal, prostate is palpable, but no obvious enlargement or nodule, smaller prostate tissue than expected   Assessment and Plan :     Encounter Diagnoses  Name Primary?  . Essential hypertension, benign Yes  . Urinary frequency   . Need for influenza vaccination   . Back pain   . Rash   . History of prostate cancer     HTN - c/t current medications.   Labs today.  Urinary frequency - urinalysis and labs today  Flu vaccine, VIS and counseling given  Back pain - resolved  Rash - resolved  Hx/o prostate cancer - prior treatment not clear, but likely combination of surgery such as TURP and radiation per pt.  PSA today.  F/u pending labs.

## 2012-01-30 LAB — LIPID PANEL
Cholesterol: 166 mg/dL (ref 0–200)
VLDL: 56 mg/dL — ABNORMAL HIGH (ref 0–40)

## 2012-01-30 LAB — HEMOGLOBIN A1C
Hgb A1c MFr Bld: 5.9 % — ABNORMAL HIGH (ref <5.7)
Mean Plasma Glucose: 123 mg/dL — ABNORMAL HIGH (ref <117)

## 2012-01-30 LAB — COMPREHENSIVE METABOLIC PANEL
AST: 18 U/L (ref 0–37)
Albumin: 4 g/dL (ref 3.5–5.2)
Alkaline Phosphatase: 38 U/L — ABNORMAL LOW (ref 39–117)
Potassium: 3.9 mEq/L (ref 3.5–5.3)
Sodium: 138 mEq/L (ref 135–145)
Total Protein: 6.6 g/dL (ref 6.0–8.3)

## 2012-01-30 LAB — PSA: PSA: 0.41 ng/mL (ref <=4.00)

## 2012-02-19 ENCOUNTER — Other Ambulatory Visit: Payer: Self-pay | Admitting: Gastroenterology

## 2012-02-19 HISTORY — PX: COLONOSCOPY: SHX174

## 2012-02-19 LAB — HM COLONOSCOPY

## 2012-02-25 ENCOUNTER — Encounter: Payer: Self-pay | Admitting: Internal Medicine

## 2012-06-07 ENCOUNTER — Other Ambulatory Visit: Payer: Self-pay | Admitting: Family Medicine

## 2012-08-06 ENCOUNTER — Other Ambulatory Visit: Payer: Self-pay | Admitting: Family Medicine

## 2012-11-05 ENCOUNTER — Other Ambulatory Visit: Payer: Self-pay | Admitting: Family Medicine

## 2013-01-31 ENCOUNTER — Other Ambulatory Visit (INDEPENDENT_AMBULATORY_CARE_PROVIDER_SITE_OTHER): Payer: Medicare Other

## 2013-01-31 DIAGNOSIS — Z23 Encounter for immunization: Secondary | ICD-10-CM

## 2013-03-10 ENCOUNTER — Encounter: Payer: Self-pay | Admitting: Medical

## 2013-03-10 ENCOUNTER — Ambulatory Visit (INDEPENDENT_AMBULATORY_CARE_PROVIDER_SITE_OTHER): Payer: Medicare Other | Admitting: Medical

## 2013-03-10 VITALS — BP 150/88 | HR 63 | Temp 97.9°F | Resp 16 | Wt 180.0 lb

## 2013-03-10 DIAGNOSIS — J309 Allergic rhinitis, unspecified: Secondary | ICD-10-CM

## 2013-03-10 DIAGNOSIS — H109 Unspecified conjunctivitis: Secondary | ICD-10-CM

## 2013-03-10 MED ORDER — POLYMYXIN B-TRIMETHOPRIM 10000-0.1 UNIT/ML-% OP SOLN: 2.0000 [drp] | OPHTHALMIC | Status: DC

## 2013-03-10 MED ORDER — FLUTICASONE PROPIONATE 50 MCG/ACT NA SUSP: 2.0000 | Freq: Every day | NASAL | Status: DC

## 2013-03-10 NOTE — Patient Instructions (Signed)
For the next week, use Polytrim eye drop antibiotic, 2 drops every 4 hours.  Wash your hands very often!!!    This is contagious, so wash hands frequently.  For allergies, begin back on Zyrtec 10mg  at bedtime.  Begin back on Flonase nasal spray for allergies, 1 spray per nostril once to twice daily.  After the eye infection clears up, begin OTC allergy eye drops.  Ask the pharmacist to show you a good OTC eye drop for allergies.

## 2013-03-10 NOTE — Progress Notes (Signed)
  Subjective:  Matthew Mckay is a 72 y.o. male who presents for eye complaint.  In general he has allergy problems with sneezing, watery nose, clear his throat often, postnasal drip that seems to be year round.  He has been out of his medication recently. He does work out in the elements every day at the [employer]. What is different about his current symptoms is that for the last several days he has had crusting of both eyes, redness of the right eye particularly, watery drainage from the right eye sometimes goupy stuff coming out of the right eye.   This is also the case with the left eye but worse in the right.    He has seen an allergist before, Dr. Sharyn Lull, has been on nasal spray and Zyrtec prior, he has had allergy testing in the past which shows he is allergic to tree pollen, dust, mold but no foods.  No other aggravating or relieving factors.    No other c/o.  The following portions of the patient's history were reviewed and updated as appropriate: allergies, current medications, past family history, past medical history, past social history, past surgical history and problem list.  ROS Otherwise as in subjective above  Objective: Physical Exam  BP 150/88  Pulse 63  Temp(Src) 97.9 F (36.6 C) (Oral)  Resp 16  Wt 180 lb (81.647 kg)   General appearance: alert, no distress, WD/WN Eyes: Both eyes with injected conjunctiva, right worse than left, both eyes are watery, the eyelids bilateral are somewhat inflamed and slightly swollen, with some dried crust on the eyelids;  he did not have his glasses today to do a vision exam, so results were not particularly valid HEENT: normocephalic, TMs pearly, nares patent, no discharge or erythema, pharynx normal Oral cavity: MMM, no lesions Neck: supple, no lymphadenopathy, no thyromegaly, no masses Lungs: CTA bilaterally, no wheezes, rhonchi, or  rales    Assessment: Encounter Diagnoses  Name Primary?  . Conjunctivitis Yes  . Allergic rhinitis      Plan: Patient Instructions  For the next week, use Polytrim eye drop antibiotic, 2 drops every 4 hours.  Wash your hands very often!!!    This is contagious, so wash hands frequently.  For allergies, begin back on Zyrtec 10mg  at bedtime.  Begin back on Flonase nasal spray for allergies, 1 spray per nostril once to twice daily.  After the eye infection clears up, begin OTC allergy eye drops.  Ask the pharmacist to show you a good OTC eye drop for allergies.    Follow up: prn

## 2013-05-03 ENCOUNTER — Other Ambulatory Visit: Payer: Self-pay | Admitting: Medical

## 2013-05-05 ENCOUNTER — Other Ambulatory Visit: Payer: Self-pay | Admitting: Medical

## 2013-05-08 NOTE — Telephone Encounter (Signed)
Left message asking patient to call back and schedule med check before I can fill rx.

## 2013-06-01 ENCOUNTER — Other Ambulatory Visit: Payer: Self-pay | Admitting: Medical

## 2013-06-02 ENCOUNTER — Other Ambulatory Visit: Payer: Self-pay | Admitting: Medical

## 2013-06-09 ENCOUNTER — Ambulatory Visit (INDEPENDENT_AMBULATORY_CARE_PROVIDER_SITE_OTHER): Payer: Medicare Other | Admitting: Medical

## 2013-06-09 ENCOUNTER — Encounter: Payer: Self-pay | Admitting: Medical

## 2013-06-09 ENCOUNTER — Other Ambulatory Visit: Payer: Self-pay | Admitting: Medical

## 2013-06-09 VITALS — BP 170/80 | HR 64 | Temp 98.0°F | Resp 14 | Wt 184.0 lb

## 2013-06-09 DIAGNOSIS — R7301 Impaired fasting glucose: Secondary | ICD-10-CM

## 2013-06-09 DIAGNOSIS — I10 Essential (primary) hypertension: Secondary | ICD-10-CM

## 2013-06-09 LAB — LIPID PANEL
CHOL/HDL RATIO: 3.8 ratio
CHOLESTEROL: 177 mg/dL (ref 0–200)
HDL: 46 mg/dL (ref >39)
LDL Cholesterol: 98 mg/dL (ref 0–99)
Triglycerides: 164 mg/dL — ABNORMAL HIGH (ref <150)
VLDL: 33 mg/dL (ref 0–40)

## 2013-06-09 LAB — CBC
HEMATOCRIT: 44 % (ref 39.0–52.0)
HEMOGLOBIN: 15.5 g/dL (ref 13.0–17.0)
MCH: 30 pg (ref 26.0–34.0)
MCHC: 35.2 g/dL (ref 30.0–36.0)
MCV: 85.1 fL (ref 78.0–100.0)
Platelets: 241 10*3/uL (ref 150–400)
RBC: 5.17 MIL/uL (ref 4.22–5.81)
RDW: 13.7 % (ref 11.5–15.5)
WBC: 10.8 10*3/uL — ABNORMAL HIGH (ref 4.0–10.5)

## 2013-06-09 LAB — COMPREHENSIVE METABOLIC PANEL
ALBUMIN: 4.2 g/dL (ref 3.5–5.2)
ALT: 16 U/L (ref 0–53)
AST: 17 U/L (ref 0–37)
Alkaline Phosphatase: 39 U/L (ref 39–117)
BUN: 20 mg/dL (ref 6–23)
CALCIUM: 9.4 mg/dL (ref 8.4–10.5)
CO2: 29 mEq/L (ref 19–32)
Chloride: 100 mEq/L (ref 96–112)
Creat: 0.75 mg/dL (ref 0.50–1.35)
Glucose, Bld: 88 mg/dL (ref 70–99)
Potassium: 3.9 mEq/L (ref 3.5–5.3)
Sodium: 137 mEq/L (ref 135–145)
Total Bilirubin: 0.4 mg/dL (ref 0.2–1.2)
Total Protein: 7.1 g/dL (ref 6.0–8.3)

## 2013-06-09 LAB — HEMOGLOBIN A1C
Hgb A1c MFr Bld: 5.7 % — ABNORMAL HIGH (ref <5.7)
Mean Plasma Glucose: 117 mg/dL — ABNORMAL HIGH (ref <117)

## 2013-06-09 LAB — TSH: TSH: 0.86 u[IU]/mL (ref 0.350–4.500)

## 2013-06-09 MED ORDER — LISINOPRIL-HYDROCHLOROTHIAZIDE 20-12.5 MG PO TABS: 1.0000 | ORAL_TABLET | Freq: Every day | ORAL | Status: DC

## 2013-06-09 MED ORDER — VERAPAMIL HCL ER 180 MG PO TBCR: 180.0000 mg | EXTENDED_RELEASE_TABLET | Freq: Every day | ORAL | Status: DC

## 2013-06-09 NOTE — Progress Notes (Signed)
   Subjective:   Matthew Mckay is a 73 y.o. male presenting on 06/09/2013 with medication check on blood pressure  He is in normal state of health.  Here for routine f/u on HTN.  He is from [place] originally, 50% irish, 50% scottish.  Essential hypertension, benign Compliant with both of his medications.  He is active on the go all the time. 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 Tries to eat a healthy diet.  No side effects of medication, he does say his pressure goes up when he comes in to the doctor's office.  Doesn't check his blood pressure at home.   He went to the eye doctor recently, was put on doxycycline for eye infection which cleared up his eyes.  Eye doctor said he had no glaucoma, no cataracts.  No other aggravating or relieving factors.  No other complaint.  Review of Systems ROS as in subjective      Objective:     Filed Vitals:   06/09/13 0937  BP: 170/80  Pulse: 64  Temp: 98 F (36.7 C)  Resp: 14    General appearance: alert, no distress, WD/WN HEENT: normocephalic, sclerae anicteric, TMs pearly, nares patent, no discharge or erythema, pharynx normal Oral cavity: MMM, no lesions Neck: supple, no lymphadenopathy, no thyromegaly, no masses, no bruits Heart: RRR, normal S1, S2, no murmurs Lungs: CTA bilaterally, no wheezes, rhonchi, or rales Abdomen: +bs, soft, non tender, non distended, no masses, no hepatomegaly, no splenomegaly Pulses: 2+ symmetric, upper and lower extremities, normal cap refill Ext: no edema    Adult ECG Report  Indication: elevated BP  Rate: 69 bpm  Rhythm: normal sinus rhythm  QRS Axis: 9 degrees  PR Interval: 135ms  QRS Duration: 32ms  QTc: 46ms  Conduction Disturbances: U waves present in several leads, but similar to prior EKG, III with strain but isolated and similar to prior EKG, some baseline interference due to patient moving around and nervous  Other Abnormalities: RSR in  V1  Patient's cardiac risk factors are: advanced age (older than 16 for men, 63 for women), hypertension and male gender.  EKG comparison: 2009, unchanged   Narrative Interpretation: U waves, early R wave progression, but no acute changes      Assessment: Encounter Diagnoses  Name Primary?  . Essential hypertension, benign Yes  . Impaired fasting blood sugar      Plan: Continue verapamil 180 daily, and we increased his Zestoretic to 20/12.5 mg daily  He continues to exercise regularly and eats healthy. Reviewed his EKG, discussed high blood pressure and medications, causes, treatment  Matthew Mckay was seen today for medication check on blood pressure.  Diagnoses and associated orders for this visit:  Essential hypertension, benign - Hemoglobin A1c - Comprehensive metabolic panel - Lipid panel - TSH - CBC - EKG 12-Lead - PR ELECTROCARDIOGRAM, COMPLETE  Impaired fasting blood sugar - Hemoglobin A1c - Comprehensive metabolic panel - Lipid panel - TSH - CBC - EKG 12-Lead - PR ELECTROCARDIOGRAM, COMPLETE  Other Orders - verapamil (CALAN-SR) 180 MG CR tablet; Take 1 tablet (180 mg total) by mouth daily. - lisinopril-hydrochlorothiazide (ZESTORETIC) 20-12.5 MG per tablet; Take 1 tablet by mouth daily.     Return pending labs.

## 2013-06-09 NOTE — Assessment & Plan Note (Signed)
Compliant with both of his medications.  He is active on the go all the time. 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 Tries to eat a healthy diet.  No side effects of medication, he does say his pressure goes up when he comes in to the doctor's office.  Doesn't check his blood pressure at home.

## 2013-06-12 NOTE — Progress Notes (Signed)
NO ANSWERS, NO VOICEMAIL

## 2014-01-26 ENCOUNTER — Other Ambulatory Visit (INDEPENDENT_AMBULATORY_CARE_PROVIDER_SITE_OTHER): Payer: Medicare Other

## 2014-01-26 DIAGNOSIS — Z23 Encounter for immunization: Secondary | ICD-10-CM

## 2014-02-26 ENCOUNTER — Other Ambulatory Visit: Payer: Self-pay | Admitting: Medical

## 2014-06-01 ENCOUNTER — Other Ambulatory Visit: Payer: Self-pay | Admitting: Medical

## 2014-07-17 DIAGNOSIS — L821 Other seborrheic keratosis: Secondary | ICD-10-CM | POA: Diagnosis not present

## 2014-07-17 DIAGNOSIS — X32XXXD Exposure to sunlight, subsequent encounter: Secondary | ICD-10-CM | POA: Diagnosis not present

## 2014-07-17 DIAGNOSIS — L570 Actinic keratosis: Secondary | ICD-10-CM | POA: Diagnosis not present

## 2014-08-28 ENCOUNTER — Encounter: Payer: Self-pay | Admitting: Medical

## 2014-08-28 ENCOUNTER — Ambulatory Visit (INDEPENDENT_AMBULATORY_CARE_PROVIDER_SITE_OTHER): Payer: Medicare Other | Admitting: Medical

## 2014-08-28 ENCOUNTER — Other Ambulatory Visit: Payer: Self-pay | Admitting: Medical

## 2014-08-28 VITALS — BP 140/80 | HR 72 | Temp 97.7°F | Resp 15 | Wt 183.0 lb

## 2014-08-28 DIAGNOSIS — H109 Unspecified conjunctivitis: Secondary | ICD-10-CM

## 2014-08-28 DIAGNOSIS — J309 Allergic rhinitis, unspecified: Principal | ICD-10-CM

## 2014-08-28 DIAGNOSIS — H6123 Impacted cerumen, bilateral: Secondary | ICD-10-CM | POA: Diagnosis not present

## 2014-08-28 DIAGNOSIS — H1013 Acute atopic conjunctivitis, bilateral: Secondary | ICD-10-CM | POA: Diagnosis not present

## 2014-08-28 MED ORDER — BECLOMETHASONE DIPROPIONATE 80 MCG/ACT NA AERS: 1.0000 | INHALATION_SPRAY | Freq: Two times a day (BID) | NASAL | Status: DC

## 2014-08-28 MED ORDER — AZELASTINE-FLUTICASONE 137-50 MCG/ACT NA SUSP: 1.0000 | Freq: Two times a day (BID) | NASAL | Status: DC

## 2014-08-28 MED ORDER — FEXOFENADINE HCL 180 MG PO TABS: 180.0000 mg | ORAL_TABLET | Freq: Every day | ORAL | Status: DC

## 2014-08-28 MED ORDER — POLYMYXIN B-TRIMETHOPRIM 10000-0.1 UNIT/ML-% OP SOLN: 2.0000 [drp] | OPHTHALMIC | Status: DC

## 2014-08-28 NOTE — Addendum Note (Signed)
Addended by: Carlena Hurl on: 08/28/2014 08:49 AM   Modules accepted: Orders, Medications

## 2014-08-28 NOTE — Progress Notes (Addendum)
Subjective: Here for bad sinus and allergy issues.   Been dealing with this for months.   Has some sinus pain, itchy nose, itchy eyes, ear pressure, cracking and popping.  Has sore throat, Takes spoons of honey, orange juice, water.  Gargles sometimes with salt water.  Has used zyrtec in the past.  Has seen allergy specialist in the past.   Cough to clear throat at times.  Has tickle in throat, runny nose all the time.  Sneezing all the time.  Has been monitoring blood pressures over the last year and has these readings today.  Most 120-130s SBP, 70-80s DBP.  No other aggravating or relieving factors. No other complaint.  ROS as in subjective  Objective: BP 140/80 mmHg  Pulse 72  Temp(Src) 97.7 F (36.5 C) (Oral)  Resp 15  Wt 183 lb (83.008 kg)  General appearence: alert, no distress, WD/WN Eyes: purulent discharge and erythema of bilat conjunctiva and blepharitis bilat otherwise normal eye exam HEENT: normocephalic,bilat ear canals with impacted cerumen, nares with turbinated edema, clear discharge , pharynx normal, tonsils unremarkable Oral cavity: MMM, no lesions Neck: supple, no lymphadenopathy, no thyromegaly, no masses Lungs: CTA bilaterally, no wheezes, rhonchi, or rales    Assessment: Encounter Diagnoses  Name Primary?  . Allergic conjunctivitis and rhinitis, bilateral Yes  . Bilateral conjunctivitis   . Impacted cerumen of both ears      Plan: Discussed symptoms, findingds, treatment, and time frame to see improvements.  Patient Instructions  Recommendations:  For 1 week use the Polytrim antibiotic eye drops, then after one week...  Begin OTC allergy eye drops.  Ask pharmacist to find a good eye drop OTC  Begin Fexofenadine/allegra tablet daily at bedtime for allergies  Begin Dymista nasal spray, 1 spray per nostril 1-2 times daily  Rinse nose out with salt water in the evenings, shower daily  Use a mask when mowing  If not seeing big improvements in 1-2  weeks,let me know  Discussed findings.  Discussed risk/benefits of procedure and patient agrees to procedure. Successfully used warm water lavage to remove impacted cerumen from bilat ear canals. Patient tolerated procedure well. Advised they avoid using any cotton swabs or other devices to clean the ear canals.  Use basic hygiene as discussed.  Follow up prn.

## 2014-08-28 NOTE — Patient Instructions (Signed)
Recommendations:  For 1 week use the Polytrim antibiotic eye drops, then after one week...  Begin OTC allergy eye drops.  Ask pharmacist to find a good eye drop OTC  Begin Fexofenadine/allegra tablet daily at bedtime for allergies  Begin Dymista nasal spray, 1 spray per nostril 1-2 times daily  Rinse nose out with salt water in the evenings, shower daily  Use a mask when mowing  If not seeing big improvements in 1-2 weeks,let me know

## 2014-08-28 NOTE — Addendum Note (Signed)
Addended by: Carlena Hurl on: 08/28/2014 08:53 AM   Modules accepted: Level of Service

## 2014-08-30 ENCOUNTER — Other Ambulatory Visit: Payer: Self-pay | Admitting: Medical

## 2014-08-31 ENCOUNTER — Other Ambulatory Visit: Payer: Self-pay | Admitting: Medical

## 2014-09-06 ENCOUNTER — Other Ambulatory Visit: Payer: Self-pay | Admitting: Medical

## 2014-09-06 ENCOUNTER — Telehealth: Payer: Self-pay | Admitting: Medical

## 2014-09-06 DIAGNOSIS — M9903 Segmental and somatic dysfunction of lumbar region: Secondary | ICD-10-CM | POA: Diagnosis not present

## 2014-09-06 DIAGNOSIS — M9905 Segmental and somatic dysfunction of pelvic region: Secondary | ICD-10-CM | POA: Diagnosis not present

## 2014-09-06 DIAGNOSIS — M9904 Segmental and somatic dysfunction of sacral region: Secondary | ICD-10-CM | POA: Diagnosis not present

## 2014-09-06 DIAGNOSIS — M5136 Other intervertebral disc degeneration, lumbar region: Secondary | ICD-10-CM | POA: Diagnosis not present

## 2014-09-06 MED ORDER — MOMETASONE FUROATE 50 MCG/ACT NA SUSP: 2.0000 | Freq: Every day | NASAL | Status: DC

## 2014-09-06 NOTE — Telephone Encounter (Signed)
Nasonex sent

## 2014-09-06 NOTE — Telephone Encounter (Signed)
Recv'd fax from [facility], ins will not cover QNASL, can you switch to covered alternative Flunisolide, Fluticasone, or Nasonex?

## 2014-09-07 DIAGNOSIS — M9905 Segmental and somatic dysfunction of pelvic region: Secondary | ICD-10-CM | POA: Diagnosis not present

## 2014-09-07 DIAGNOSIS — M9903 Segmental and somatic dysfunction of lumbar region: Secondary | ICD-10-CM | POA: Diagnosis not present

## 2014-09-07 DIAGNOSIS — M5136 Other intervertebral disc degeneration, lumbar region: Secondary | ICD-10-CM | POA: Diagnosis not present

## 2014-09-07 DIAGNOSIS — M9904 Segmental and somatic dysfunction of sacral region: Secondary | ICD-10-CM | POA: Diagnosis not present

## 2014-09-07 NOTE — Telephone Encounter (Signed)
flonase OTC is fine

## 2014-09-07 NOTE — Telephone Encounter (Signed)
Called pharmacy & Nasonex is $95 with insurance but pt can get OTC Flonase for $16, is this ok?

## 2014-09-10 DIAGNOSIS — M5136 Other intervertebral disc degeneration, lumbar region: Secondary | ICD-10-CM | POA: Diagnosis not present

## 2014-09-10 DIAGNOSIS — M9903 Segmental and somatic dysfunction of lumbar region: Secondary | ICD-10-CM | POA: Diagnosis not present

## 2014-09-10 DIAGNOSIS — M9905 Segmental and somatic dysfunction of pelvic region: Secondary | ICD-10-CM | POA: Diagnosis not present

## 2014-09-10 DIAGNOSIS — M9904 Segmental and somatic dysfunction of sacral region: Secondary | ICD-10-CM | POA: Diagnosis not present

## 2014-09-11 DIAGNOSIS — M5136 Other intervertebral disc degeneration, lumbar region: Secondary | ICD-10-CM | POA: Diagnosis not present

## 2014-09-11 DIAGNOSIS — M9903 Segmental and somatic dysfunction of lumbar region: Secondary | ICD-10-CM | POA: Diagnosis not present

## 2014-09-11 DIAGNOSIS — M9904 Segmental and somatic dysfunction of sacral region: Secondary | ICD-10-CM | POA: Diagnosis not present

## 2014-09-11 DIAGNOSIS — M9905 Segmental and somatic dysfunction of pelvic region: Secondary | ICD-10-CM | POA: Diagnosis not present

## 2014-09-11 MED ORDER — FLUTICASONE PROPIONATE 50 MCG/ACT NA SUSP: 2.0000 | Freq: Every day | NASAL | Status: DC

## 2014-09-11 NOTE — Telephone Encounter (Signed)
Pt informed

## 2014-11-08 DIAGNOSIS — M9903 Segmental and somatic dysfunction of lumbar region: Secondary | ICD-10-CM | POA: Diagnosis not present

## 2014-11-08 DIAGNOSIS — M5136 Other intervertebral disc degeneration, lumbar region: Secondary | ICD-10-CM | POA: Diagnosis not present

## 2014-11-08 DIAGNOSIS — M9905 Segmental and somatic dysfunction of pelvic region: Secondary | ICD-10-CM | POA: Diagnosis not present

## 2014-11-08 DIAGNOSIS — M9904 Segmental and somatic dysfunction of sacral region: Secondary | ICD-10-CM | POA: Diagnosis not present

## 2014-11-09 DIAGNOSIS — M9905 Segmental and somatic dysfunction of pelvic region: Secondary | ICD-10-CM | POA: Diagnosis not present

## 2014-11-09 DIAGNOSIS — M9903 Segmental and somatic dysfunction of lumbar region: Secondary | ICD-10-CM | POA: Diagnosis not present

## 2014-11-09 DIAGNOSIS — M9904 Segmental and somatic dysfunction of sacral region: Secondary | ICD-10-CM | POA: Diagnosis not present

## 2014-11-09 DIAGNOSIS — M5136 Other intervertebral disc degeneration, lumbar region: Secondary | ICD-10-CM | POA: Diagnosis not present

## 2014-11-19 DIAGNOSIS — H01019 Ulcerative blepharitis unspecified eye, unspecified eyelid: Secondary | ICD-10-CM | POA: Diagnosis not present

## 2014-11-19 DIAGNOSIS — H02132 Senile ectropion of right lower eyelid: Secondary | ICD-10-CM | POA: Diagnosis not present

## 2014-11-19 DIAGNOSIS — H1131 Conjunctival hemorrhage, right eye: Secondary | ICD-10-CM | POA: Diagnosis not present

## 2014-11-24 ENCOUNTER — Other Ambulatory Visit: Payer: Self-pay | Admitting: Medical

## 2014-11-27 DIAGNOSIS — M9903 Segmental and somatic dysfunction of lumbar region: Secondary | ICD-10-CM | POA: Diagnosis not present

## 2014-11-27 DIAGNOSIS — M5136 Other intervertebral disc degeneration, lumbar region: Secondary | ICD-10-CM | POA: Diagnosis not present

## 2014-11-27 DIAGNOSIS — M9905 Segmental and somatic dysfunction of pelvic region: Secondary | ICD-10-CM | POA: Diagnosis not present

## 2014-11-27 DIAGNOSIS — M9904 Segmental and somatic dysfunction of sacral region: Secondary | ICD-10-CM | POA: Diagnosis not present

## 2014-11-29 DIAGNOSIS — M9903 Segmental and somatic dysfunction of lumbar region: Secondary | ICD-10-CM | POA: Diagnosis not present

## 2014-11-29 DIAGNOSIS — M9905 Segmental and somatic dysfunction of pelvic region: Secondary | ICD-10-CM | POA: Diagnosis not present

## 2014-11-29 DIAGNOSIS — M5136 Other intervertebral disc degeneration, lumbar region: Secondary | ICD-10-CM | POA: Diagnosis not present

## 2014-11-29 DIAGNOSIS — M9904 Segmental and somatic dysfunction of sacral region: Secondary | ICD-10-CM | POA: Diagnosis not present

## 2014-11-30 DIAGNOSIS — M9904 Segmental and somatic dysfunction of sacral region: Secondary | ICD-10-CM | POA: Diagnosis not present

## 2014-11-30 DIAGNOSIS — M9903 Segmental and somatic dysfunction of lumbar region: Secondary | ICD-10-CM | POA: Diagnosis not present

## 2014-11-30 DIAGNOSIS — M5136 Other intervertebral disc degeneration, lumbar region: Secondary | ICD-10-CM | POA: Diagnosis not present

## 2014-11-30 DIAGNOSIS — M9905 Segmental and somatic dysfunction of pelvic region: Secondary | ICD-10-CM | POA: Diagnosis not present

## 2014-12-31 ENCOUNTER — Encounter: Payer: Self-pay | Admitting: Medical

## 2014-12-31 ENCOUNTER — Telehealth: Payer: Self-pay

## 2014-12-31 ENCOUNTER — Ambulatory Visit (INDEPENDENT_AMBULATORY_CARE_PROVIDER_SITE_OTHER): Payer: Medicare Other | Admitting: Medical

## 2014-12-31 VITALS — BP 168/90 | HR 60 | Temp 98.5°F | Wt 181.4 lb

## 2014-12-31 DIAGNOSIS — J0110 Acute frontal sinusitis, unspecified: Secondary | ICD-10-CM | POA: Diagnosis not present

## 2014-12-31 DIAGNOSIS — J309 Allergic rhinitis, unspecified: Secondary | ICD-10-CM | POA: Diagnosis not present

## 2014-12-31 DIAGNOSIS — I10 Essential (primary) hypertension: Secondary | ICD-10-CM | POA: Diagnosis not present

## 2014-12-31 MED ORDER — CETIRIZINE HCL 10 MG PO TABS: 10.0000 mg | ORAL_TABLET | Freq: Every day | ORAL | Status: DC

## 2014-12-31 MED ORDER — CEFUROXIME AXETIL 500 MG PO TABS: 500.0000 mg | ORAL_TABLET | Freq: Two times a day (BID) | ORAL | Status: DC

## 2014-12-31 MED ORDER — AZELASTINE-FLUTICASONE 137-50 MCG/ACT NA SUSP: 1.0000 | Freq: Two times a day (BID) | NASAL | Status: DC

## 2014-12-31 MED ORDER — LISINOPRIL-HYDROCHLOROTHIAZIDE 20-12.5 MG PO TABS: 1.0000 | ORAL_TABLET | Freq: Every day | ORAL | Status: DC

## 2014-12-31 NOTE — Telephone Encounter (Signed)
Pharmacy called saying his nasal spray isn't covered by his insurance. The pt wants to know if he can get something else.

## 2014-12-31 NOTE — Telephone Encounter (Signed)
pls do prior auth.  He has failed Flonase, Nasacort, nasal saline, Nasonex.   I tried before to Qnasal covered and it failed.  Lets try Dymista.

## 2014-12-31 NOTE — Progress Notes (Signed)
Subjective:  Matthew Mckay is a 74 y.o. male who presents for possible sinus infection.   Symptoms include ear congestion, eye irritation, facial pain, headache, nasal congestion, sneezing and sore throat. Denies fever, productive cough, tooth pain and wheezing. Using Flonase, zyrtec for symptoms. reports several sick contacts.  Past history is significant for chronic allergies and sinus issues.   Patient is not a smoker. Compliant with BP medication verapamil but not lisinopril HCT added last year, but numbers continue to be elevated on recent visits. No other aggravating or relieving factors.  No other c/o.  Past Medical History  Diagnosis Date  . Hypertension   . Allergy   . GERD (gastroesophageal reflux disease)   . Anxiety   . Hemorrhoid   . Prostate cancer     history of radiation therapy    ROS as in subjective   Objective: BP 168/90 mmHg  Pulse 60  Temp(Src) 98.5 F (36.9 C) (Oral)  Wt 181 lb 6.4 oz (82.283 kg)  General appearance: Alert, WD/WN, no distress                             Skin: warm, no rash                           Head: + frontal sinus tenderness,                            Eyes: conjunctiva normal, corneas clear, PERRLA                            Ears: pearly TMs, external ear canals normal                          Nose: septum midline, turbinates swollen, with erythema and clear discharge             Mouth/throat: MMM, tongue normal, mild pharyngeal erythema                           Neck: supple, no adenopathy, no thyromegaly, non tender                          Heart: RRR, normal S1, S2, no murmurs                         Lungs: CTA bilaterally, no wheezes, rales, or rhonchi      Assessment  Encounter Diagnoses  Name Primary?  . Acute frontal sinusitis, recurrence not specified Yes  . Allergic rhinitis, unspecified allergic rhinitis type   . Essential hypertension       Plan: Sinusitis - begin Ceftin Allergic rhinitis and  conjunctivitis - c/t OTC eye drops, change to Dymista nasal, c/t zyrtec HTN - c/t verapamil but add back Lisinopril HCT  Specific home care recommendations today include:  Only take over-the-counter (OTC) or prescription medicines for pain, discomfort, or fever as directed by your caregiver.    Decongestant: You may use OTC Guaifenesin (Mucinex plain) for congestion.  You may use Pseudoephedrine (Sudafed) only if you don't have blood pressure problems or a diagnosis of hypertension.  Cough suppression: If you have cough from drainage, you may use  over-the-counter Dextromethorphan (Delsym) as directed on the label  Pain/fever relief: You may use over-the-counter Tylenol for pain or fever  Drink extra fluids. Fluids help thin the mucus so your sinuses can drain more easily.   Applying either moist heat or ice packs to the sinus areas may help relieve discomfort.  Use saline nasal sprays to help moisten your sinuses. The sprays can be found at your local drugstore.   Prescription (s) given: Matthew Mckay was seen today for sore throat, drainage, congestion.  Diagnoses and all orders for this visit:  Acute frontal sinusitis, recurrence not specified  Allergic rhinitis, unspecified allergic rhinitis type  Essential hypertension  Other orders -     cefUROXime (CEFTIN) 500 MG tablet; Take 1 tablet (500 mg total) by mouth 2 (two) times daily with a meal. -     cetirizine (ZYRTEC) 10 MG tablet; Take 1 tablet (10 mg total) by mouth at bedtime. -     Azelastine-Fluticasone (DYMISTA) 137-50 MCG/ACT SUSP; Place 1 spray into the nose 2 (two) times daily. -     lisinopril-hydrochlorothiazide (PRINZIDE,ZESTORETIC) 20-12.5 MG per tablet; Take 1 tablet by mouth daily.    Patient was advised to call or return if worse or not improving in the next few days.    Patient voiced understanding of diagnosis, recommendations, and treatment plan.  After visit summary given.

## 2014-12-31 NOTE — Patient Instructions (Signed)
Recommendations For the acute sinus infection, begin Ceftin antibiotic twice daily for 10 days  For allergies Continue Zyrtec daily at bedtime, but begin trial of Dymista nasal spray, 1 spray each nostril twice daily  For blood pressure Continue Verapamil daily but lets add back Lisinopril HCT once daily as well  return in in 4 weeks for med check and fasting labs

## 2015-01-01 NOTE — Telephone Encounter (Signed)
[place] is covered but cost is $178.  [place] is Tier 4 and cost of Tier 4 drugs are $95, but pt has deductible also so total is $178.  Called [facility] And initiated Tier Exception t# [phone] ID# [id number]  Should have response within 72 hours

## 2015-01-02 NOTE — Telephone Encounter (Signed)
Recv'd response from [facility] and pt needs trial of Azelastine nasal spray before Tiering exception can be considered Do you want to switch?

## 2015-01-03 ENCOUNTER — Other Ambulatory Visit: Payer: Self-pay | Admitting: Medical

## 2015-01-03 MED ORDER — AZELASTINE & FLUTICASONE 137 & 50 MCG/ACT NA THPK: 1.0000 | PACK | Freq: Two times a day (BID) | NASAL | Status: DC

## 2015-01-03 NOTE — Telephone Encounter (Signed)
See electronic order.  [facility].

## 2015-01-08 DIAGNOSIS — M9904 Segmental and somatic dysfunction of sacral region: Secondary | ICD-10-CM | POA: Diagnosis not present

## 2015-01-08 DIAGNOSIS — M9903 Segmental and somatic dysfunction of lumbar region: Secondary | ICD-10-CM | POA: Diagnosis not present

## 2015-01-08 DIAGNOSIS — M5136 Other intervertebral disc degeneration, lumbar region: Secondary | ICD-10-CM | POA: Diagnosis not present

## 2015-01-08 DIAGNOSIS — M9905 Segmental and somatic dysfunction of pelvic region: Secondary | ICD-10-CM | POA: Diagnosis not present

## 2015-01-08 NOTE — Telephone Encounter (Signed)
Recv'd Tiering Exception approval for Dymista.  Called pharmacy & cost still [phone], so will just try the Azelastine cost $46, pt informed

## 2015-01-09 DIAGNOSIS — M5136 Other intervertebral disc degeneration, lumbar region: Secondary | ICD-10-CM | POA: Diagnosis not present

## 2015-01-09 DIAGNOSIS — M9903 Segmental and somatic dysfunction of lumbar region: Secondary | ICD-10-CM | POA: Diagnosis not present

## 2015-01-09 DIAGNOSIS — M9905 Segmental and somatic dysfunction of pelvic region: Secondary | ICD-10-CM | POA: Diagnosis not present

## 2015-01-09 DIAGNOSIS — M9904 Segmental and somatic dysfunction of sacral region: Secondary | ICD-10-CM | POA: Diagnosis not present

## 2015-01-10 DIAGNOSIS — M9903 Segmental and somatic dysfunction of lumbar region: Secondary | ICD-10-CM | POA: Diagnosis not present

## 2015-01-10 DIAGNOSIS — M9904 Segmental and somatic dysfunction of sacral region: Secondary | ICD-10-CM | POA: Diagnosis not present

## 2015-01-10 DIAGNOSIS — M5136 Other intervertebral disc degeneration, lumbar region: Secondary | ICD-10-CM | POA: Diagnosis not present

## 2015-01-10 DIAGNOSIS — M9905 Segmental and somatic dysfunction of pelvic region: Secondary | ICD-10-CM | POA: Diagnosis not present

## 2015-01-11 DIAGNOSIS — M9904 Segmental and somatic dysfunction of sacral region: Secondary | ICD-10-CM | POA: Diagnosis not present

## 2015-01-11 DIAGNOSIS — M9903 Segmental and somatic dysfunction of lumbar region: Secondary | ICD-10-CM | POA: Diagnosis not present

## 2015-01-11 DIAGNOSIS — M9905 Segmental and somatic dysfunction of pelvic region: Secondary | ICD-10-CM | POA: Diagnosis not present

## 2015-01-11 DIAGNOSIS — M5136 Other intervertebral disc degeneration, lumbar region: Secondary | ICD-10-CM | POA: Diagnosis not present

## 2015-01-14 ENCOUNTER — Telehealth: Payer: Self-pay | Admitting: Family Medicine

## 2015-01-14 DIAGNOSIS — M5136 Other intervertebral disc degeneration, lumbar region: Secondary | ICD-10-CM | POA: Diagnosis not present

## 2015-01-14 DIAGNOSIS — M9905 Segmental and somatic dysfunction of pelvic region: Secondary | ICD-10-CM | POA: Diagnosis not present

## 2015-01-14 DIAGNOSIS — M9903 Segmental and somatic dysfunction of lumbar region: Secondary | ICD-10-CM | POA: Diagnosis not present

## 2015-01-14 DIAGNOSIS — M9904 Segmental and somatic dysfunction of sacral region: Secondary | ICD-10-CM | POA: Diagnosis not present

## 2015-01-14 NOTE — Telephone Encounter (Signed)
Left message for pt to call. Needs a cpe.

## 2015-01-17 DIAGNOSIS — M9903 Segmental and somatic dysfunction of lumbar region: Secondary | ICD-10-CM | POA: Diagnosis not present

## 2015-01-17 DIAGNOSIS — M9904 Segmental and somatic dysfunction of sacral region: Secondary | ICD-10-CM | POA: Diagnosis not present

## 2015-01-17 DIAGNOSIS — M9905 Segmental and somatic dysfunction of pelvic region: Secondary | ICD-10-CM | POA: Diagnosis not present

## 2015-01-17 DIAGNOSIS — M5136 Other intervertebral disc degeneration, lumbar region: Secondary | ICD-10-CM | POA: Diagnosis not present

## 2015-01-21 ENCOUNTER — Other Ambulatory Visit (INDEPENDENT_AMBULATORY_CARE_PROVIDER_SITE_OTHER): Payer: Medicare Other

## 2015-01-21 DIAGNOSIS — Z23 Encounter for immunization: Secondary | ICD-10-CM

## 2015-01-21 DIAGNOSIS — M9903 Segmental and somatic dysfunction of lumbar region: Secondary | ICD-10-CM | POA: Diagnosis not present

## 2015-01-21 DIAGNOSIS — M9905 Segmental and somatic dysfunction of pelvic region: Secondary | ICD-10-CM | POA: Diagnosis not present

## 2015-01-21 DIAGNOSIS — M5136 Other intervertebral disc degeneration, lumbar region: Secondary | ICD-10-CM | POA: Diagnosis not present

## 2015-01-21 DIAGNOSIS — M9904 Segmental and somatic dysfunction of sacral region: Secondary | ICD-10-CM | POA: Diagnosis not present

## 2015-02-12 ENCOUNTER — Ambulatory Visit (INDEPENDENT_AMBULATORY_CARE_PROVIDER_SITE_OTHER): Payer: Medicare Other | Admitting: Medical

## 2015-02-12 ENCOUNTER — Encounter: Payer: Self-pay | Admitting: Medical

## 2015-02-12 VITALS — BP 140/60 | HR 67 | Temp 98.5°F | Ht 68.0 in | Wt 178.0 lb

## 2015-02-12 DIAGNOSIS — Z7185 Encounter for immunization safety counseling: Secondary | ICD-10-CM | POA: Insufficient documentation

## 2015-02-12 DIAGNOSIS — R319 Hematuria, unspecified: Secondary | ICD-10-CM | POA: Diagnosis not present

## 2015-02-12 DIAGNOSIS — Z23 Encounter for immunization: Secondary | ICD-10-CM | POA: Insufficient documentation

## 2015-02-12 DIAGNOSIS — Z0000 Encounter for general adult medical examination without abnormal findings: Secondary | ICD-10-CM | POA: Insufficient documentation

## 2015-02-12 DIAGNOSIS — Z7189 Other specified counseling: Secondary | ICD-10-CM | POA: Diagnosis not present

## 2015-02-12 DIAGNOSIS — J309 Allergic rhinitis, unspecified: Secondary | ICD-10-CM | POA: Diagnosis not present

## 2015-02-12 DIAGNOSIS — M25561 Pain in right knee: Secondary | ICD-10-CM | POA: Insufficient documentation

## 2015-02-12 DIAGNOSIS — R7301 Impaired fasting glucose: Secondary | ICD-10-CM | POA: Diagnosis not present

## 2015-02-12 DIAGNOSIS — J3089 Other allergic rhinitis: Secondary | ICD-10-CM | POA: Insufficient documentation

## 2015-02-12 DIAGNOSIS — J329 Chronic sinusitis, unspecified: Secondary | ICD-10-CM | POA: Diagnosis not present

## 2015-02-12 DIAGNOSIS — Z8546 Personal history of malignant neoplasm of prostate: Secondary | ICD-10-CM

## 2015-02-12 DIAGNOSIS — I10 Essential (primary) hypertension: Secondary | ICD-10-CM | POA: Insufficient documentation

## 2015-02-12 LAB — CBC
HEMATOCRIT: 44.9 % (ref 39.0–52.0)
Hemoglobin: 15.3 g/dL (ref 13.0–17.0)
MCH: 29.5 pg (ref 26.0–34.0)
MCHC: 34.1 g/dL (ref 30.0–36.0)
MCV: 86.7 fL (ref 78.0–100.0)
MPV: 9.4 fL (ref 8.6–12.4)
Platelets: 223 10*3/uL (ref 150–400)
RBC: 5.18 MIL/uL (ref 4.22–5.81)
RDW: 12.7 % (ref 11.5–15.5)
WBC: 11.3 10*3/uL — AB (ref 4.0–10.5)

## 2015-02-12 LAB — LIPID PANEL
Cholesterol: 134 mg/dL (ref 125–200)
HDL: 36 mg/dL — ABNORMAL LOW (ref >=40)
LDL CALC: 75 mg/dL (ref <130)
TRIGLYCERIDES: 117 mg/dL (ref <150)
Total CHOL/HDL Ratio: 3.7 Ratio (ref <=5.0)
VLDL: 23 mg/dL (ref <30)

## 2015-02-12 LAB — POCT URINALYSIS DIPSTICK
Bilirubin, UA: NEGATIVE
Glucose, UA: NEGATIVE
Ketones, UA: NEGATIVE
LEUKOCYTES UA: NEGATIVE
NITRITE UA: NEGATIVE
PH UA: 5.5
PROTEIN UA: NEGATIVE
RBC UA: 1
Spec Grav, UA: 1.025
Urobilinogen, UA: NEGATIVE

## 2015-02-12 LAB — POCT UA - MICROSCOPIC ONLY
Bacteria, U Microscopic: NEGATIVE
Casts, Ur, LPF, POC: NEGATIVE
Crystals, Ur, HPF, POC: NEGATIVE
Epithelial cells, urine per micros: NEGATIVE
Mucus, UA: NEGATIVE
RBC, urine, microscopic: POSITIVE
WBC, Ur, HPF, POC: NEGATIVE
Yeast, UA: NEGATIVE

## 2015-02-12 LAB — COMPREHENSIVE METABOLIC PANEL
ALT: 12 U/L (ref 9–46)
AST: 17 U/L (ref 10–35)
Albumin: 3.8 g/dL (ref 3.6–5.1)
Alkaline Phosphatase: 38 U/L — ABNORMAL LOW (ref 40–115)
BUN: 19 mg/dL (ref 7–25)
CHLORIDE: 99 mmol/L (ref 98–110)
CO2: 28 mmol/L (ref 20–31)
Calcium: 9.2 mg/dL (ref 8.6–10.3)
Creat: 0.71 mg/dL (ref 0.70–1.18)
Glucose, Bld: 96 mg/dL (ref 65–99)
POTASSIUM: 3.9 mmol/L (ref 3.5–5.3)
Sodium: 137 mmol/L (ref 135–146)
TOTAL PROTEIN: 7 g/dL (ref 6.1–8.1)
Total Bilirubin: 0.6 mg/dL (ref 0.2–1.2)

## 2015-02-12 MED ORDER — FAMOTIDINE 40 MG PO TABS: 40.0000 mg | ORAL_TABLET | Freq: Every day | ORAL | Status: DC

## 2015-02-12 MED ORDER — VERAPAMIL HCL ER 240 MG PO TBCR: 240.0000 mg | EXTENDED_RELEASE_TABLET | Freq: Every day | ORAL | Status: DC

## 2015-02-12 MED ORDER — LISINOPRIL-HYDROCHLOROTHIAZIDE 20-12.5 MG PO TABS: 1.0000 | ORAL_TABLET | Freq: Every day | ORAL | Status: DC

## 2015-02-12 NOTE — Progress Notes (Signed)
Subjective: Chief Complaint  Patient presents with  . Annual Exam    fasting labs. will try to give urine at the end of visit. 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   Medical Team: Sees chiropractor on [facility] Dr. Clarene Essex, GI Dr. Carmelina Peal and Johnanna Schneiders, allergist Dr. Allyn Kenner, dermatology Not seeing Urology currently, had seen Urologist in [place] in the past, treatment for prostate cancer 1995. Crisoforo Oxford, PA-C here with Dr. Redmond School for primary care  He reports some right knee pain, shoots up to the right hip.  Been going on for months.  Always on his feet.  Has some pain on the left knee.    Has chronic allergy problems, the zyrtec and nasal spray works well.  Hypertension - compliant with medication.  No reported chest pain, SOB, edema, palpitations.  No prior stress test.    Past Medical History  Diagnosis Date  . Hypertension   . Allergy   . GERD (gastroesophageal reflux disease)   . Anxiety   . Hemorrhoid   . Prostate cancer [facility])     history of radiation therapy  . Chronic sinusitis   . Diverticulosis     sigmoid colon and increased vascularization due to prior radiation, per 2013 colonoscopy    Past Surgical History  Procedure Laterality Date  . Colonoscopy  02/2012    sigmoid diverticulosis, polyps, Dr. Clarene Essex  . Prostatectomy      1990s    Social History   Social History  . Marital Status: Married    Spouse Name: N/A  . Number of Children: N/A  . Years of Education: N/A   Occupational History  . Not on file.   Social History Main Topics  . Smoking status: Never Smoker   . Smokeless tobacco: Not on file  . Alcohol Use: No  . Drug Use: No  . Sexual Activity: Not on file   Other Topics Concern  . Not on file   Social History Narrative   Lives alone. Widowed in 2009.   Handles  own ADLs.   [employer] engineer, contracting, always busy, yard work, garden work, works at [employer].    Goes to [place].   6 brothers and 3 sisters.  Originally from [place], brought up in [place].  Father is [place], mother from [place]. Has family all over the place, [place], [place], [place], [place]    Family History  Problem Relation Age of Onset  . Hypertension Mother   . Cancer Father     lung, smoked 2 ppd  . Cancer Paternal Uncle     cancer  . Cancer Paternal Uncle     stomach  . Cancer Brother     stomach  . Arthritis Brother   . Arthritis Brother   . Hypertension Brother   . Hypertension Brother      Current outpatient prescriptions:  .  Azelastine-Fluticasone (DYMISTA) 137-50 MCG/ACT SUSP, Place 1 spray into the nose 2 (two) times daily., Disp: 1 Bottle, Rfl: 2 .  cetirizine (ZYRTEC) 10 MG tablet, Take 1 tablet (10 mg total) by mouth at bedtime., Disp: 90 tablet, Rfl: 3 .  lisinopril-hydrochlorothiazide (PRINZIDE,ZESTORETIC) 20-12.5 MG tablet, Take 1 tablet by mouth daily., Disp: 90 tablet, Rfl: 3 .  trimethoprim-polymyxin b (POLYTRIM) ophthalmic solution, Place 2 drops  into both eyes every 4 (four) hours., Disp: 10 mL, Rfl: 0 .  famotidine (PEPCID) 40 MG tablet, Take 1 tablet (40 mg total) by mouth daily., Disp: 90 tablet, Rfl: 3 .  fluticasone (FLONASE) 50 MCG/ACT nasal spray, Place 2 sprays into both nostrils daily. (Patient not taking: Reported on 02/12/2015), Disp: 16 g, Rfl: 1 .  verapamil (CALAN-SR) 240 MG CR tablet, Take 1 tablet (240 mg total) by mouth at bedtime., Disp: 90 tablet, Rfl: 3  No Known Allergies   Objective: BP 140/60 mmHg  Pulse 67  Temp(Src) 98.5 F (36.9 C) (Oral)  Ht 5\' 8"  (1.727 m)  Wt 178 lb (80.74 kg)  BMI 27.07 kg/m2  SpO2 98%  BP Readings from Last 3 Encounters:  02/12/15 140/60  12/31/14 168/90  08/28/14 140/80   General appearance: alert, no distress, WD/WN, white male Skin: several rough yellow brown crusted lesions of bilat  forearms and forehead, no other specific worrisome lesions HEENT: normocephalic, conjunctiva/corneas normal, sclerae anicteric, PERRLA, EOMi, nares patent, no discharge or erythema, pharynx normal Oral cavity: MMM, tongue normal, teeth in good repair, missing lower rihgt incisor, missing a few upper molars Neck: supple, no lymphadenopathy, no thyromegaly, no masses, normal ROM, no bruits Chest: non tender, normal shape and expansion Heart: RRR, normal S1, S2, no murmurs Lungs: CTA bilaterally, no wheezes, rhonchi, or rales Abdomen: +bs, soft, non tender, non distended, no masses, no hepatomegaly, no splenomegaly, no bruits Back: non tender, normal ROM, no scoliosis Musculoskeletal: no specific tenderness or swelling of right knee, no laxity, otherwise upper extremities non tender, no obvious deformity, normal ROM throughout, lower extremities non tender, no obvious deformity, normal ROM throughout Extremities: mild LE varicosities, spider veins present of bilat knees, otherwise no edema, no cyanosis, no clubbing Pulses: 2+ symmetric, upper and lower extremities, normal cap refill Neurological: alert, oriented x 3, CN2-12 intact, strength normal upper extremities and lower extremities, sensation normal throughout, DTRs 2+ throughout, no cerebellar signs, gait normal Psychiatric: normal affect, behavior normal, pleasant  GU and DRE - declined   Assessment: Encounter Diagnoses  Name Primary?  . Essential hypertension Yes  . Encounter for health maintenance examination in adult   . Chronic sinusitis, unspecified location   . Allergic rhinitis, unspecified allergic rhinitis type   . History of prostate cancer   . Need for prophylactic vaccination against Streptococcus pneumoniae (pneumococcus)   . Vaccine counseling   . Right knee pain     Plan: Physical exam - discussed healthy lifestyle, diet, exercise, preventative care, vaccinations, and addressed their concerns.   Advised they see a  dentist yearly for routine dental care including hygiene visits twice yearly. Advised they see an eye doctor yearly for routine vision care. See dermatology at this time Of note, he declines EKG, GU or DRE exam today  Labs today: Routine screening labs today  Vaccinations: Discussed appropriate vaccines including Tdap, Influenza, pneumococcal Counseled on the pneumococcal vaccine.  Vaccine information sheet given.  Pneumococcal Prevnar 13 vaccine given after consent obtained. He will check insurance coverage for Tdap.  Other concerns today: Right knee pain - will send for xray Hypertension - c/t Lisinopril HCT 20/12.5mg  daily.  Increase Verapamil to 240mg  SR daily. GERD - begin trial of Famotidine daily, avoid GERD triggers. Discussed advanced directives, gave our generic form that he can use to complete.    I reviewed the recent home health eval he had done through [place].    Follow up pending labs

## 2015-02-12 NOTE — Addendum Note (Signed)
Addended by: Billie Lade on: 02/12/2015 11:20 AM   Modules accepted: Orders, SmartSet

## 2015-02-12 NOTE — Patient Instructions (Signed)
  Thank you for giving me the opportunity to serve you today.    Your diagnosis today includes: Encounter Diagnoses  Name Primary?  . Essential hypertension Yes  . Encounter for health maintenance examination in adult   . Chronic sinusitis, unspecified location   . Allergic rhinitis, unspecified allergic rhinitis type   . History of prostate cancer   . Need for prophylactic vaccination against Streptococcus pneumoniae (pneumococcus)   . Vaccine counseling   . Right knee pain     Recommendations: Right knee pain - go for xray  Hypertension   Continue Lisinopril HCT daily  Increase to Verapamil 240mg  SR daily.  This is increased from the 180mg  tablet you take now  Exercise daily  Eat a healthy low fat diet  Allergies and chronic sinus issues  Continue Zyrtec and nasal spray daily  GERD  Begin trial of Famotidine once daily for acid reflux  Please take the time with your daughter to review and complete the Advanced Directives (living will and healthy care power of attorney).  Notarize these and get [place] a copy  Check your insurance coverage for the Tdap (tetanus, diptheria, pertussis vaccine)  We updated your Prevnar 13 pneumococcal vaccine today  See your eye doctor yearly for routine vision care. See your dentist yearly for routine dental care including hygiene visits twice yearly. See [place] yearly for physical See Dr. [place] Crane your dermatologist soon

## 2015-02-13 ENCOUNTER — Other Ambulatory Visit: Payer: Self-pay | Admitting: Medical

## 2015-02-13 LAB — MICROALBUMIN / CREATININE URINE RATIO
Creatinine, Urine: 81 mg/dL (ref 20–370)
MICROALB/CREAT RATIO: 41 ug/mg{creat} — AB (ref <30)
Microalb, Ur: 3.3 mg/dL

## 2015-02-13 LAB — PSA: PSA: 0.37 ng/mL (ref <=4.00)

## 2015-02-13 LAB — HEMOGLOBIN A1C
Hgb A1c MFr Bld: 5.9 % — ABNORMAL HIGH (ref <5.7)
MEAN PLASMA GLUCOSE: 123 mg/dL — AB (ref <117)

## 2015-02-13 MED ORDER — ATORVASTATIN CALCIUM 10 MG PO TABS: 10.0000 mg | ORAL_TABLET | Freq: Every day | ORAL | Status: DC

## 2015-04-30 DIAGNOSIS — D0461 Carcinoma in situ of skin of right upper limb, including shoulder: Secondary | ICD-10-CM | POA: Diagnosis not present

## 2015-04-30 DIAGNOSIS — X32XXXD Exposure to sunlight, subsequent encounter: Secondary | ICD-10-CM | POA: Diagnosis not present

## 2015-04-30 DIAGNOSIS — L570 Actinic keratosis: Secondary | ICD-10-CM | POA: Diagnosis not present

## 2015-04-30 DIAGNOSIS — L718 Other rosacea: Secondary | ICD-10-CM | POA: Diagnosis not present

## 2015-06-14 DIAGNOSIS — H02112 Cicatricial ectropion of right lower eyelid: Secondary | ICD-10-CM | POA: Diagnosis not present

## 2015-06-14 DIAGNOSIS — H524 Presbyopia: Secondary | ICD-10-CM | POA: Diagnosis not present

## 2015-06-14 DIAGNOSIS — H01019 Ulcerative blepharitis unspecified eye, unspecified eyelid: Secondary | ICD-10-CM | POA: Diagnosis not present

## 2015-06-14 DIAGNOSIS — H2513 Age-related nuclear cataract, bilateral: Secondary | ICD-10-CM | POA: Diagnosis not present

## 2015-08-08 ENCOUNTER — Encounter: Payer: Self-pay | Admitting: Allergy and Immunology

## 2015-08-08 ENCOUNTER — Ambulatory Visit (INDEPENDENT_AMBULATORY_CARE_PROVIDER_SITE_OTHER): Payer: Medicare Other | Admitting: Allergy and Immunology

## 2015-08-08 VITALS — BP 130/76 | HR 88 | Resp 16

## 2015-08-08 DIAGNOSIS — K219 Gastro-esophageal reflux disease without esophagitis: Secondary | ICD-10-CM

## 2015-08-08 DIAGNOSIS — H1013 Acute atopic conjunctivitis, bilateral: Secondary | ICD-10-CM | POA: Diagnosis not present

## 2015-08-08 DIAGNOSIS — J3089 Other allergic rhinitis: Secondary | ICD-10-CM | POA: Diagnosis not present

## 2015-08-08 MED ORDER — AZELASTINE HCL 0.15 % NA SOLN
NASAL | Status: DC
Start: 2015-08-08 — End: 2016-07-30

## 2015-08-08 MED ORDER — LEVOCETIRIZINE DIHYDROCHLORIDE 5 MG PO TABS: 5.0000 mg | ORAL_TABLET | Freq: Every evening | ORAL | Status: DC

## 2015-08-08 MED ORDER — FLUTICASONE PROPIONATE 50 MCG/ACT NA SUSP: 1.0000 | Freq: Two times a day (BID) | NASAL | Status: DC

## 2015-08-08 MED ORDER — OLOPATADINE HCL 0.7 % OP SOLN: 1.0000 [drp] | OPHTHALMIC | Status: DC

## 2015-08-08 NOTE — Patient Instructions (Addendum)
Rhinitis, allergic  Aeroallergen avoidance measures have been discussed and provided in written form.  A prescription has been provided for levocetirizine, 5 mg daily as needed.  A prescription has been provided for Pazeo, one drop per eye daily as needed.  As the patient is unable to for Dymista nasal spray, a prescription has been provided for azelastine nasal spray, 1-2 sprays per nostril twice daily, and fluticasone nasal spray, one spray per nostril twice daily.  Nasal saline lavage (NeilMed) as needed has been recommended along with instructions for proper administration.  Allergic conjunctivitis  Treatment plan as outlined above.  A prescription has been provided for Pazeo, one drop per eye daily as needed.  GERD (gastroesophageal reflux disease)  For now, continue famotidine 40 mg daily.  Appropriate reflux lifestyle modifications have been provided in written form.    Return in about 6 months (around 02/07/2016), or if symptoms worsen or fail to improve.  Reducing Pollen Exposure  The American Academy of Allergy, Asthma and Immunology suggests the following steps to reduce your exposure to pollen during allergy seasons.    1. Do not hang sheets or clothing out to dry; pollen may collect on these items. 2. Do not mow lawns or spend time around freshly cut grass; mowing stirs up pollen. 3. Keep windows closed at night.  Keep car windows closed while driving. 4. Minimize morning activities outdoors, a time when pollen counts are usually at their highest. 5. Stay indoors as much as possible when pollen counts or humidity is high and on windy days when pollen tends to remain in the air longer. 6. Use air conditioning when possible.  Many air conditioners have filters that trap the pollen spores. 7. Use a HEPA room air filter to remove pollen form the indoor air you breathe.  Lifestyle Changes for Controlling GERD  When you have GERD, stomach acid feels as if it's backing  up toward your mouth. Whether or not you take medication to control your GERD, your symptoms can often be improved with lifestyle changes.   Raise Your Head  Reflux is more likely to strike when you're lying down flat, because stomach fluid can  flow backward more easily. Raising the head of your bed 4-6 inches can help. To do this:  Slide blocks or books under the legs at the head of your bed. Or, place a wedge under  the mattress. Many foam stores can make a suitable wedge for you. The wedge  should run from your waist to the top of your head.  Don't just prop your head on several pillows. This increases pressure on your  stomach. It can make GERD worse.  Watch Your Eating Habits Certain foods may increase the acid in your stomach or relax the lower esophageal sphincter, making GERD more likely. It's best to avoid the following:  Coffee, tea, and carbonated drinks (with and without caffeine)  Fatty, fried, or spicy food  Mint, chocolate, onions, and tomatoes  Any other foods that seem to irritate your stomach or cause you pain  Relieve the Pressure  Eat smaller meals, even if you have to eat more often.  Don't lie down right after you eat. Wait a few hours for your stomach to empty.  Avoid tight belts and tight-fitting clothes.  Lose excess weight.  Tobacco and Alcohol  Avoid smoking tobacco and drinking alcohol. They can make GERD symptoms worse.

## 2015-08-08 NOTE — Assessment & Plan Note (Signed)
   Treatment plan as outlined above.  A prescription has been provided for Pazeo, one drop per eye daily as needed.

## 2015-08-08 NOTE — Progress Notes (Signed)
Follow-up Note  RE: Matthew Mckay MRN: [id number] DOB: [birth year] Date of Office Visit: 08/08/2015  Primary care provider: Crisoforo Oxford, PA-C Referring provider: Carlena Hurl, PA-C  History of present illness: HPI Comments: Matthew Mckay is a 75 y.o. male with allergic rhinitis, perceptual reflux disease, and history of asthma presenting today for sick visit.  He was last seen in this clinic on 12/15/2013.  He reports that since the onset of spring pollen season he has experienced severe ocular pruritus, nasal congestion, rhinorrhea, sneezing, postnasal drainage, and throat clearing.  Dymista nasal spray had been prescribed for him but it is too expensive.  He is currently taking famotidine 40 mg daily to control reflux symptoms.   Assessment and plan: Rhinitis, allergic  Aeroallergen avoidance measures have been discussed and provided in written form.  A prescription has been provided for levocetirizine, 5 mg daily as needed.  A prescription has been provided for Pazeo, one drop per eye daily as needed.  As the patient is unable to for Dymista nasal spray, a prescription has been provided for azelastine nasal spray, 1-2 sprays per nostril twice daily, and fluticasone nasal spray, one spray per nostril twice daily.  Nasal saline lavage (NeilMed) as needed has been recommended along with instructions for proper administration.  Allergic conjunctivitis  Treatment plan as outlined above.  A prescription has been provided for Pazeo, one drop per eye daily as needed.    Meds ordered this encounter  Medications  . levocetirizine (XYZAL) 5 MG tablet    Sig: Take 1 tablet (5 mg total) by mouth every evening.    Dispense:  30 tablet    Refill:  5  . Olopatadine HCl (PAZEO) 0.7 % SOLN    Sig: Place 1 drop into both eyes 1 day or 1 dose.    Dispense:  1 Bottle    Refill:  5  . Azelastine HCl 0.15 % SOLN    Sig: 1-2 SPRAYS IN EACH NOSTRIL DAILY    Dispense:  30  mL    Refill:  5  . fluticasone (FLONASE) 50 MCG/ACT nasal spray    Sig: Place 1 spray into both nostrils 2 (two) times daily.    Dispense:  16 g    Refill:  5    Physical examination: Blood pressure 130/76, pulse 88, resp. rate 16.  General: Alert, interactive, in no acute distress. HEENT: TMs pearly gray, turbinates edematous with clear discharge, post-pharynx erythematous.  Conjunctival injection bilaterally. Neck: Supple without lymphadenopathy. Lungs: Clear to auscultation without wheezing, rhonchi or rales. CV: Normal S1, S2 without murmurs. Skin: Warm and dry, without lesions or rashes.  The following portions of the patient's history were reviewed and updated as appropriate: allergies, current medications, past family history, past medical history, past social history, past surgical history and problem list.    Medication List       This list is accurate as of: 08/08/15 11:06 AM.  Always use your most recent med list.               atorvastatin 10 MG tablet  Commonly known as:  LIPITOR  Take 1 tablet (10 mg total) by mouth daily.     Azelastine HCl 0.15 % Soln  1-2 SPRAYS IN EACH NOSTRIL DAILY     Azelastine-Fluticasone 137-50 MCG/ACT Susp  Commonly known as:  DYMISTA  Place 1 spray into the nose 2 (two) times daily.     cetirizine 10 MG tablet  Commonly known as:  ZYRTEC  Take 1 tablet (10 mg total) by mouth at bedtime.     famotidine 40 MG tablet  Commonly known as:  PEPCID  Take 1 tablet (40 mg total) by mouth daily.     fluticasone 50 MCG/ACT nasal spray  Commonly known as:  FLONASE  Place 2 sprays into both nostrils daily.     fluticasone 50 MCG/ACT nasal spray  Commonly known as:  FLONASE  Place 1 spray into both nostrils 2 (two) times daily.     levocetirizine 5 MG tablet  Commonly known as:  XYZAL  Take 1 tablet (5 mg total) by mouth every evening.     lisinopril-hydrochlorothiazide 20-12.5 MG tablet  Commonly known as:  PRINZIDE,ZESTORETIC   Take 1 tablet by mouth daily.     Olopatadine HCl 0.7 % Soln  Commonly known as:  PAZEO  Place 1 drop into both eyes 1 day or 1 dose.     trimethoprim-polymyxin b ophthalmic solution  Commonly known as:  POLYTRIM  Place 2 drops into both eyes every 4 (four) hours.     verapamil 240 MG CR tablet  Commonly known as:  CALAN-SR  Take 1 tablet (240 mg total) by mouth at bedtime.        No Known Allergies  Review of systems: Constitutional: Negative for fever, chills and weight loss.  HENT: Negative for nosebleeds.   Positive for nasal congestion, rhinorrhea, postnasal drainage. Eyes: Negative for blurred vision.  Positive for ocular pruritus. Respiratory: Negative for hemoptysis.   Negative for dyspnea, wheezing. Cardiovascular: Negative for chest pain.  Gastrointestinal: Negative for diarrhea and constipation.  Positive for acid reflux. Genitourinary: Negative for dysuria.  Musculoskeletal: Negative for myalgias and joint pain.  Neurological: Negative for dizziness.  Endo/Heme/Allergies: Does not bruise/bleed easily.   Past Medical History  Diagnosis Date  . Hypertension   . Allergy   . GERD (gastroesophageal reflux disease)   . Anxiety   . Hemorrhoid   . Prostate cancer [facility])     history of radiation therapy  . Chronic sinusitis   . Diverticulosis     sigmoid colon and increased vascularization due to prior radiation, per 2013 colonoscopy    Family History  Problem Relation Age of Onset  . Hypertension Mother   . Cancer Father     lung, smoked 2 ppd  . Cancer Paternal Uncle     cancer  . Cancer Paternal Uncle     stomach  . Cancer Brother     stomach  . Arthritis Brother   . Arthritis Brother   . Hypertension Brother   . Hypertension Brother     Social History   Social History  . Marital Status: Married    Spouse Name: N/A  . Number of Children: N/A  . Years of Education: N/A   Occupational History  . Not on file.   Social History Main Topics    . Smoking status: Never Smoker   . Smokeless tobacco: Not on file  . Alcohol Use: No  . Drug Use: No  . Sexual Activity: Not on file   Other Topics Concern  . Not on file   Social History Narrative   Lives alone. Widowed in 2009.   Handles own ADLs.   [employer] engineer, contracting, always busy, yard work, garden work, works at [employer].    Goes to [place].   6 brothers and 3 sisters.  Originally from [place], brought up in [place].  Father is [place], mother from [place]. Has family all over the place, [place], [place], [place], [place]    I appreciate the opportunity to take part in this Dorr's care. Please do not hesitate to contact me with questions.  Sincerely,   R. Edgar Frisk, MD

## 2015-08-08 NOTE — Assessment & Plan Note (Signed)
   Aeroallergen avoidance measures have been discussed and provided in written form.  A prescription has been provided for levocetirizine, 5 mg daily as needed.  A prescription has been provided for Pazeo, one drop per eye daily as needed.  As the patient is unable to for Dymista nasal spray, a prescription has been provided for azelastine nasal spray, 1-2 sprays per nostril twice daily, and fluticasone nasal spray, one spray per nostril twice daily.  Nasal saline lavage (NeilMed) as needed has been recommended along with instructions for proper administration.

## 2015-08-08 NOTE — Assessment & Plan Note (Signed)
   For now, continue famotidine 40 mg daily.  Appropriate reflux lifestyle modifications have been provided in written form.

## 2015-08-09 ENCOUNTER — Other Ambulatory Visit: Payer: Self-pay | Admitting: Medical

## 2015-08-13 DIAGNOSIS — B029 Zoster without complications: Secondary | ICD-10-CM | POA: Diagnosis not present

## 2015-09-02 ENCOUNTER — Encounter: Payer: Self-pay | Admitting: Medical

## 2015-09-02 ENCOUNTER — Ambulatory Visit (INDEPENDENT_AMBULATORY_CARE_PROVIDER_SITE_OTHER): Payer: Medicare Other | Admitting: Medical

## 2015-09-02 VITALS — BP 140/80 | HR 73 | Wt 180.0 lb

## 2015-09-02 DIAGNOSIS — B029 Zoster without complications: Secondary | ICD-10-CM

## 2015-09-02 DIAGNOSIS — B0229 Other postherpetic nervous system involvement: Secondary | ICD-10-CM | POA: Diagnosis not present

## 2015-09-02 MED ORDER — TRIAMCINOLONE ACETONIDE 0.1 % EX CREA: 1.0000 "application " | TOPICAL_CREAM | Freq: Two times a day (BID) | CUTANEOUS | Status: DC

## 2015-09-02 MED ORDER — GABAPENTIN 100 MG PO CAPS: ORAL_CAPSULE | ORAL | Status: DC

## 2015-09-02 NOTE — Patient Instructions (Signed)
Postherpetic Neuralgia   Postherpetic neuralgia (PHN) is nerve pain that occurs after a shingles infection. Shingles is a painful rash that appears on one side of the body, usually on your trunk or face. Shingles is caused by the varicella-zoster virus. This is the same virus that causes chickenpox. In people who have had chickenpox, the virus can resurface years later and cause shingles.   You may have PHN if you continue to have pain for 3 months after your shingles rash has gone away. PHN appears in the same area where you had the shingles rash. For most people, PHN goes away within 1 year.   Getting a vaccination for shingles can prevent PHN. This vaccine is recommended for people older than 50. It may prevent shingles and may also lower your risk of PHN if you do get shingles.   CAUSES   PHN is caused by damage to your nerves from the varicella-zoster virus. This damage makes your nerves overly sensitive.   RISK FACTORS   Aging is the biggest risk factor for developing PHN. Most people who get PHN are older than 60. Other risk factors include:   Having very bad pain before your shingles rash starts.   Having a very bad rash.   Having shingles in the nerve that supplies your face and eye (trigeminal nerve).  SIGNS AND SYMPTOMS   Pain is the main symptom of PHN. The pain is often very bad and may be described as stabbing, burning, or feeling like an electric shock. The pain may come and go or may be there all the time. Pain may be triggered by light touches on the skin or changes in temperature. You may have itching along with the pain.   DIAGNOSIS   Your health care provider may diagnose PHN based on your symptoms and your history of shingles. Lab studies and other diagnostic tests are usually not needed.   TREATMENT   There is no cure for PHN. Treatment for PHN will focus on pain relief. Over-the-counter pain relievers do not usually relieve PHN pain. You may need to work with a pain specialist. Treatment may  include:   Antidepressant medicines to help with pain and improve sleep.   Antiseizure medicines to relieve nerve pain.   Strong pain relievers (opioids).   A numbing patch worn on the skin (lidocaine patch).  HOME CARE INSTRUCTIONS   It may take a long time to recover from PHN. Work closely with your health care provider, and have a good support system at home.   Take all medicines as directed by your health care provider.   Wear loose, comfortable clothing.   Cover sensitive areas with a dressing to reduce friction from clothing rubbing on the area.   If cold does not make your pain worse, try applying a cool compress or cooling gel pack to the area.   Talk to your health care provider if you feel depressed or desperate. Living with long-term pain can be depressing.  SEEK MEDICAL CARE IF:   Your medicine is not helping.   You are struggling to manage your pain at home.  This information is not intended to replace advice given to you by your health care provider. Make sure you discuss any questions you have with your health care provider.   Document Released: 06/27/2002 Document Revised: 04/27/2014 Document Reviewed: 03/28/2013   Elsevier Interactive Patient Education 2016 Elsevier Inc[employer]

## 2015-09-02 NOTE — Progress Notes (Signed)
Subjective: Chief Complaint  Patient presents with  . possible shingles    seen dr hall, derm. was taking valcyclovir and said the rash is gone but the pain is still there.    Here for shingles f/u.   Saw dermatology recently for rash, was diagnosed with shingles along right lower abdomen, flank.   Was put on Valacyclovir, is on the second batch.    Was diagnosed 3 weeks ago.  Currently feels numb along right flank, but clothes touching the area and sleeping on the area is still quite painful.  Didn't use any cream other than Vicks initially.  No other aggravating or relieving factors. No other complaint.   Past Medical History  Diagnosis Date  . Hypertension   . Allergy   . GERD (gastroesophageal reflux disease)   . Anxiety   . Hemorrhoid   . Prostate cancer [facility])     history of radiation therapy  . Chronic sinusitis   . Diverticulosis     sigmoid colon and increased vascularization due to prior radiation, per 2013 colonoscopy   ROS as in subjective   Objective: BP 140/80 mmHg  Pulse 73  Wt 180 lb (81.647 kg)  BP Readings from Last 3 Encounters:  09/02/15 140/80  08/08/15 130/76  02/12/15 140/60   General appearance: alert, no distress, WD/WN,  Skin: right flank in the T12 dermatome, 2 patches of healing round erythematous base with healing vesicular lesions appearance  Assessment: Encounter Diagnoses  Name Primary?  . Post herpetic neuralgia Yes  . Shingles      Plan: discussed diagnosis, precautions, limiting spread to others, he has finished valacyclovir, begin Neurontin QHS and if not improving in 2 weeks, then go to BID . Can use topical cream x 1-2 weeks. F/u 37mo.     Matthew Mckay was seen today for possible shingles.  Diagnoses and all orders for this visit:  Post herpetic neuralgia  Shingles  Other orders -     gabapentin (NEURONTIN) 100 MG capsule; Begin QHS, but may go up to BID -     triamcinolone cream (KENALOG) 0.1 %; Apply 1 application topically 2  (two) times daily.

## 2015-10-03 ENCOUNTER — Encounter: Payer: Self-pay | Admitting: Medical

## 2015-10-03 ENCOUNTER — Ambulatory Visit (INDEPENDENT_AMBULATORY_CARE_PROVIDER_SITE_OTHER): Payer: Medicare Other | Admitting: Medical

## 2015-10-03 VITALS — BP 130/70 | HR 70 | Wt 182.0 lb

## 2015-10-03 DIAGNOSIS — L989 Disorder of the skin and subcutaneous tissue, unspecified: Secondary | ICD-10-CM

## 2015-10-03 DIAGNOSIS — B0229 Other postherpetic nervous system involvement: Secondary | ICD-10-CM

## 2015-10-03 MED ORDER — GABAPENTIN 100 MG PO CAPS: ORAL_CAPSULE | ORAL | Status: DC

## 2015-10-03 NOTE — Progress Notes (Signed)
Subjective: Chief Complaint  Patient presents with  . Follow-up    on shingles. states they are 95% better. no more pain from shingles but still having irritation from it.    Here for shingles f/u.  I saw him on 09/02/15 for post herpetic neuralgia after he had been seen by dermatology for shingles.    He had used a round of Valacyclovir x 2, and at that time given the abdominal flank pain, and numbness, we started topical steroid cream, and Neurontin 100mg  once daily.   Didn't want to take it BID.   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  No other aggravating or relieving factors. No other complaint.  In March had a new grandson Valarie Merino that was born in [place].  His grand daughter Larene Beach just graduated recently from [employer].  Has 8 grandchildren.  Has son Randall An in [place].   Past Medical History  Diagnosis Date  . Hypertension   . Allergy   . GERD (gastroesophageal reflux disease)   . Anxiety   . Hemorrhoid   . Prostate cancer [facility])     history of radiation therapy  . Chronic sinusitis   . Diverticulosis     sigmoid colon and increased vascularization due to prior radiation, per 2013 colonoscopy   ROS as in subjective   Objective: BP 130/70 mmHg  Pulse 70  Wt 182 lb (82.555 kg)  General appearance: alert, no distress, WD/WN Skin: right flank in the T12 dermatome, 2 patches of healing round erythematous base with healing vesicular lesions appearance   Assessment: Encounter Diagnoses  Name Primary?  . Post herpetic neuralgia Yes  . Skin lesion      Plan: Can change to shea butter for the shingles residual rash or OTC moisturizing lotion, c/t Gabapentin 1-2 more months depending upon symptoms.  Improving.     Skin lesion - advised SPF 50 Sunblock on arms and hands, stop Triamcinolone cream on hands and arms  Patient Instructions   Encounter Diagnoses  Name  Primary?  . Post herpetic neuralgia Yes  . Skin lesion     Recommendations:  I recommend you use sun block SPF 50 on your hands and arms daily  STOP the triamcinolone steroid cream for now.   This can be used for rash flares ups such as bug bites, but STOP using it now on the shingles rash and hands/arms  You can use Shea butter cream or other lotion OTC on the shingles rash until it resolves  I recommend you continue the Neurontin/Gabapentin 100mg  daily for another month then stop this.  If the pain comes back, then we may need to continue the Neurontin, but lets go 1 more month since you still have some irritation at the rash

## 2015-10-03 NOTE — Patient Instructions (Signed)
Encounter Diagnoses  Name Primary?  . Post herpetic neuralgia Yes  . Skin lesion     Recommendations:  I recommend you use sun block SPF 50 on your hands and arms daily  STOP the triamcinolone steroid cream for now.   This can be used for rash flares ups such as bug bites, but STOP using it now on the shingles rash and hands/arms  You can use Shea butter cream or other lotion OTC on the shingles rash until it resolves  I recommend you continue the Neurontin/Gabapentin 100mg  daily for another month then stop this.  If the pain comes back, then we may need to continue the Neurontin, but lets go 1 more month since you still have some irritation at the rash

## 2015-12-09 ENCOUNTER — Other Ambulatory Visit: Payer: Self-pay | Admitting: Medical

## 2016-01-21 ENCOUNTER — Encounter: Payer: Self-pay | Admitting: Medical

## 2016-01-21 ENCOUNTER — Ambulatory Visit (INDEPENDENT_AMBULATORY_CARE_PROVIDER_SITE_OTHER): Payer: Medicare Other | Admitting: Medical

## 2016-01-21 VITALS — BP 148/80 | HR 57 | Ht 68.0 in | Wt 180.0 lb

## 2016-01-21 DIAGNOSIS — L84 Corns and callosities: Secondary | ICD-10-CM | POA: Diagnosis not present

## 2016-01-21 DIAGNOSIS — Z23 Encounter for immunization: Secondary | ICD-10-CM | POA: Diagnosis not present

## 2016-01-21 DIAGNOSIS — M79673 Pain in unspecified foot: Secondary | ICD-10-CM

## 2016-01-21 NOTE — Patient Instructions (Signed)
Encounter Diagnoses  Name Primary?  . Corn of foot Yes  . Encounter for immunization   . Need for prophylactic vaccination and inoculation against influenza    Recommendations:  We used cryotherapy today for the corn of the foot  Use neosporin ointment OTC with a bandaid the next few days as long as you have a blister or raw area where we treated the corn  Use OTC donut pads such as Dr. Matthias Hughs corn pads for comfort  Keep the area clean with soap and water  Follow with Dr. Nevada Crane as planned and let him look at the foot  Call your insurance to see what your copay for tetanus vaccine will cost.

## 2016-01-21 NOTE — Progress Notes (Signed)
Subjective: Chief Complaint  Patient presents with  . Foot Pain    pt states that he has "corn" on his left foot, has tried medication and not working, pt states its very painful    Here for corn of foot, left lateral foot, painful, been using OTC corn remover, but this isn't helping.     Also here for flu shot.  Past Medical History:  Diagnosis Date  . Allergy   . Anxiety   . Chronic sinusitis   . Diverticulosis    sigmoid colon and increased vascularization due to prior radiation, per 2013 colonoscopy  . GERD (gastroesophageal reflux disease)   . Hemorrhoid   . Hypertension   . Prostate cancer [facility])    history of radiation therapy   Current Outpatient Prescriptions on File Prior to Visit  Medication Sig Dispense Refill  . atorvastatin (LIPITOR) 10 MG tablet TAKE 1 TABLET EVERY DAY 90 tablet 0  . Azelastine HCl 0.15 % SOLN 1-2 SPRAYS IN EACH NOSTRIL DAILY 30 mL 5  . cetirizine (ZYRTEC) 10 MG tablet Take 1 tablet (10 mg total) by mouth at bedtime. 90 tablet 3  . famotidine (PEPCID) 40 MG tablet Take 1 tablet (40 mg total) by mouth daily. 90 tablet 3  . gabapentin (NEURONTIN) 100 MG capsule 1 tablet po daily 30 capsule 1  . levocetirizine (XYZAL) 5 MG tablet Take 1 tablet (5 mg total) by mouth every evening. 30 tablet 5  . lisinopril-hydrochlorothiazide (PRINZIDE,ZESTORETIC) 20-12.5 MG tablet Take 1 tablet by mouth daily. 90 tablet 3  . Olopatadine HCl (PAZEO) 0.7 % SOLN Place 1 drop into both eyes 1 day or 1 dose. 1 Bottle 5  . triamcinolone cream (KENALOG) 0.1 % Apply 1 application topically 2 (two) times daily. 30 g 0  . trimethoprim-polymyxin b (POLYTRIM) ophthalmic solution Place 2 drops into both eyes every 4 (four) hours. 10 mL 0  . verapamil (CALAN-SR) 240 MG CR tablet Take 1 tablet (240 mg total) by mouth at bedtime. 90 tablet 3   No current facility-administered medications on file prior to visit.      ROS as in subjective   Objective: BP (!) 148/80   Pulse (!)  57   Ht 5\' 8"  (1.727 m)   Wt 180 lb (81.6 kg)   SpO2 98%   BMI 27.37 kg/m   Gen: wd, wn, nad Left lateral foot along 5th MTP with 7mm round thick tender corn lesion   Assessment: Encounter Diagnosis  Name Primary?  . Encounter for immunization      Plan: Discussed corn findings. Gave options for therapy.   He consents for cryotherapy.   Trimmed surface of the left foot corn with sterile razor, then used cryotherapy.  discussed wound care, use of donut pad for comfort.  Covered wound with bacitracin ointment and bandaid.  He sees dermatology in a few weeks for routine f/u and will have Dr. Nevada Crane review the corn at that time.    Counseled on the influenza virus vaccine.  Vaccine information sheet given.   High dose Influenza vaccine given after consent obtained.

## 2016-02-01 ENCOUNTER — Other Ambulatory Visit: Payer: Self-pay | Admitting: Medical

## 2016-02-05 ENCOUNTER — Other Ambulatory Visit: Payer: Self-pay | Admitting: Allergy and Immunology

## 2016-02-05 DIAGNOSIS — J3089 Other allergic rhinitis: Secondary | ICD-10-CM

## 2016-02-05 DIAGNOSIS — H1013 Acute atopic conjunctivitis, bilateral: Secondary | ICD-10-CM

## 2016-02-06 ENCOUNTER — Other Ambulatory Visit: Payer: Self-pay | Admitting: Medical

## 2016-03-10 ENCOUNTER — Other Ambulatory Visit: Payer: Self-pay | Admitting: Medical

## 2016-03-12 ENCOUNTER — Other Ambulatory Visit: Payer: Self-pay | Admitting: Medical

## 2016-03-19 ENCOUNTER — Other Ambulatory Visit: Payer: Self-pay | Admitting: Medical

## 2016-05-18 ENCOUNTER — Telehealth: Payer: Self-pay

## 2016-05-18 DIAGNOSIS — M9905 Segmental and somatic dysfunction of pelvic region: Secondary | ICD-10-CM | POA: Diagnosis not present

## 2016-05-18 DIAGNOSIS — M9903 Segmental and somatic dysfunction of lumbar region: Secondary | ICD-10-CM | POA: Diagnosis not present

## 2016-05-18 DIAGNOSIS — M9904 Segmental and somatic dysfunction of sacral region: Secondary | ICD-10-CM | POA: Diagnosis not present

## 2016-05-18 DIAGNOSIS — M5136 Other intervertebral disc degeneration, lumbar region: Secondary | ICD-10-CM | POA: Diagnosis not present

## 2016-05-18 NOTE — Telephone Encounter (Signed)
Pt fell this am and went to chiropractor was told everything is ok. No broken bones. He is a little bruised. Chiropractor told him to take medication for pain and daughter questions if he can have ibuprofen? They wanted to make sure no interactions with current meds. Daughter [name] [phone]- pt provided verbal permission for [place] to talk to his daughter [name].

## 2016-05-18 NOTE — Telephone Encounter (Signed)
Ok to take

## 2016-05-19 NOTE — Telephone Encounter (Signed)
Called number provided to inform Matthew Mckay daughter okay for him to take

## 2016-05-22 DIAGNOSIS — L570 Actinic keratosis: Secondary | ICD-10-CM | POA: Diagnosis not present

## 2016-05-22 DIAGNOSIS — X32XXXD Exposure to sunlight, subsequent encounter: Secondary | ICD-10-CM | POA: Diagnosis not present

## 2016-05-22 DIAGNOSIS — L84 Corns and callosities: Secondary | ICD-10-CM | POA: Diagnosis not present

## 2016-05-22 DIAGNOSIS — C44719 Basal cell carcinoma of skin of left lower limb, including hip: Secondary | ICD-10-CM | POA: Diagnosis not present

## 2016-06-08 ENCOUNTER — Other Ambulatory Visit: Payer: Self-pay | Admitting: Medical

## 2016-06-08 NOTE — Telephone Encounter (Signed)
Patient was seen multiple times last year for different things but no lab work done for lipids. Is it okay to refill this

## 2016-06-19 ENCOUNTER — Other Ambulatory Visit: Payer: Self-pay | Admitting: Medical

## 2016-07-27 ENCOUNTER — Other Ambulatory Visit: Payer: Self-pay | Admitting: Medical

## 2016-07-30 ENCOUNTER — Encounter: Payer: Self-pay | Admitting: Medical

## 2016-07-30 ENCOUNTER — Ambulatory Visit (INDEPENDENT_AMBULATORY_CARE_PROVIDER_SITE_OTHER): Payer: Medicare Other | Admitting: Medical

## 2016-07-30 VITALS — BP 132/80 | HR 62 | Wt 187.4 lb

## 2016-07-30 DIAGNOSIS — J3089 Other allergic rhinitis: Secondary | ICD-10-CM

## 2016-07-30 DIAGNOSIS — Z8546 Personal history of malignant neoplasm of prostate: Secondary | ICD-10-CM

## 2016-07-30 DIAGNOSIS — I10 Essential (primary) hypertension: Secondary | ICD-10-CM | POA: Diagnosis not present

## 2016-07-30 DIAGNOSIS — Z7185 Encounter for immunization safety counseling: Secondary | ICD-10-CM

## 2016-07-30 DIAGNOSIS — M170 Bilateral primary osteoarthritis of knee: Secondary | ICD-10-CM | POA: Insufficient documentation

## 2016-07-30 DIAGNOSIS — K219 Gastro-esophageal reflux disease without esophagitis: Secondary | ICD-10-CM | POA: Diagnosis not present

## 2016-07-30 DIAGNOSIS — Z7189 Other specified counseling: Secondary | ICD-10-CM | POA: Insufficient documentation

## 2016-07-30 DIAGNOSIS — H1013 Acute atopic conjunctivitis, bilateral: Secondary | ICD-10-CM | POA: Diagnosis not present

## 2016-07-30 DIAGNOSIS — L237 Allergic contact dermatitis due to plants, except food: Secondary | ICD-10-CM

## 2016-07-30 DIAGNOSIS — Z131 Encounter for screening for diabetes mellitus: Secondary | ICD-10-CM | POA: Insufficient documentation

## 2016-07-30 DIAGNOSIS — Z0000 Encounter for general adult medical examination without abnormal findings: Secondary | ICD-10-CM | POA: Diagnosis not present

## 2016-07-30 DIAGNOSIS — G250 Essential tremor: Secondary | ICD-10-CM

## 2016-07-30 DIAGNOSIS — Z136 Encounter for screening for cardiovascular disorders: Secondary | ICD-10-CM | POA: Insufficient documentation

## 2016-07-30 MED ORDER — AZELASTINE HCL 0.15 % NA SOLN: NASAL | 11 refills | Status: DC

## 2016-07-30 MED ORDER — CLOBETASOL PROPIONATE 0.05 % EX CREA: 1.0000 "application " | TOPICAL_CREAM | Freq: Two times a day (BID) | CUTANEOUS | 0 refills | Status: DC

## 2016-07-30 MED ORDER — LEVOCETIRIZINE DIHYDROCHLORIDE 5 MG PO TABS: 5.0000 mg | ORAL_TABLET | Freq: Every evening | ORAL | 11 refills | Status: DC

## 2016-07-30 MED ORDER — ACETAMINOPHEN-CODEINE 300-30 MG PO TABS: 1.0000 | ORAL_TABLET | Freq: Four times a day (QID) | ORAL | 0 refills | Status: DC | PRN

## 2016-07-30 MED ORDER — PREDNISONE 20 MG PO TABS: ORAL_TABLET | ORAL | 0 refills | Status: DC

## 2016-07-30 NOTE — Progress Notes (Signed)
Subjective:    Matthew Mckay is a 76 y.o. male who presents for Preventative Services visit and chronic medical problems/med check visit.    Primary Care Provider Crisoforo Oxford, PA-C here for primary care  Current Health Care Team: Medical Team: Sees chiropractor on [facility] Dr. Clarene Essex, GI Dr. Carmelina Peal and Johnanna Schneiders, allergist Dr. Allyn Kenner, dermatology Not seeing Urology currently, had seen Urologist in [place] in the past, treatment for prostate cancer 1995. Crisoforo Oxford, PA-C here with Dr. Redmond School for primary care  Medical Services you may have received from other than [facility] providers in the past year (date may be approximate) none  Exercise Current exercise habits: walking daily on the job   Nutrition/Diet Current diet: in general, a "healthy" diet    Depression Screen Depression screen [facility] 2/9 02/12/2015  Decreased Interest 0  Down, Depressed, Hopeless 0  PHQ - 2 Score 0    Activities of Daily Living Screen/Functional Status Survey    Can patient draw a clock face showing 3:15 oclock, yes  Fall Risk Screen Fall Risk  02/12/2015 12/31/2014  Falls in the past year? No No    Gait Assessment: Normal gait observed yes  Advanced directives Does patient have a [place]? No Does patient have a Living Will? No  Past Medical History:  Diagnosis Date  . Allergy   . Anxiety   . Chronic sinusitis   . Diverticulosis    sigmoid colon and increased vascularization due to prior radiation, per 2013 colonoscopy  . GERD (gastroesophageal reflux disease)   . Hemorrhoid   . Hypertension   . Prostate cancer [facility])    history of radiation therapy    Past Surgical History:  Procedure Laterality Date  . COLONOSCOPY  02/2012   sigmoid diverticulosis, polyps, Dr. Clarene Essex  . PROSTATECTOMY     1990s    Social History   Social History  . Marital status: Married    Spouse name: N/A  . Number of children: N/A  . Years of  education: N/A   Occupational History  . Not on file.   Social History Main Topics  . Smoking status: Never Smoker  . Smokeless tobacco: Never Used  . Alcohol use No  . Drug use: No  . Sexual activity: Not on file   Other Topics Concern  . Not on file   Social History Narrative   Lives alone. Widowed in 2009.   Handles own ADLs.   [employer] engineer, contracting, always busy, yard work, garden work, works at [employer].    Goes to [employer]   6 brothers and 3 sisters.  Originally from [place], brought up in [place].  Father is [place], mother from [place]. Has family all over the place, [place], [place], [place], [place]    Family History  Problem Relation Age of Onset  . Hypertension Mother   . Cancer Father     lung, smoked 2 ppd  . Cancer Paternal Uncle     cancer  . Cancer Paternal Uncle     stomach  . Cancer Brother     stomach  . Arthritis Brother   . Arthritis Brother   . Hypertension Brother   . Hypertension Brother      Current Outpatient Prescriptions:  .  atorvastatin (LIPITOR) 10 MG tablet, TAKE 1 TABLET EVERY DAY, Disp: 90 tablet, Rfl: 0 .  Azelastine HCl 0.15 % SOLN, 1-2 SPRAYS IN EACH NOSTRIL DAILY, Disp: 30 mL, Rfl:  11 .  famotidine (PEPCID) 40 MG tablet, TAKE 1 TABLET BY MOUTH DAILY, Disp: 90 tablet, Rfl: 3 .  levocetirizine (XYZAL) 5 MG tablet, Take 1 tablet (5 mg total) by mouth every evening., Disp: 30 tablet, Rfl: 11 .  lisinopril-hydrochlorothiazide (PRINZIDE,ZESTORETIC) 20-12.5 MG tablet, TAKE 1 TABLET BY MOUTH DAILY., Disp: 90 tablet, Rfl: 0 .  Olopatadine HCl (PAZEO) 0.7 % SOLN, Place 1 drop into both eyes 1 day or 1 dose., Disp: 1 Bottle, Rfl: 5 .  triamcinolone cream (KENALOG) 0.1 %, Apply 1 application topically 2 (two) times daily., Disp: 30 g, Rfl: 0 .  trimethoprim-polymyxin b (POLYTRIM) ophthalmic solution, Place 2 drops into both eyes every 4 (four) hours., Disp: 10 mL, Rfl: 0 .  verapamil (CALAN-SR) 240 MG CR tablet, TAKE 1 TABLET BY MOUTH AT  BEDTIME, Disp: 90 tablet, Rfl: 0 .  Acetaminophen-Codeine (TYLENOL/CODEINE #3) 300-30 MG tablet, Take 1 tablet by mouth every 6 (six) hours as needed for pain., Disp: 15 tablet, Rfl: 0 .  clobetasol cream (TEMOVATE) 0.07 %, Apply 1 application topically 2 (two) times daily., Disp: 30 g, Rfl: 0 .  predniSONE (DELTASONE) 20 MG tablet, 2 tablets daily x 3 days, Disp: 6 tablet, Rfl: 0  No Known Allergies  History reviewed: allergies, current medications, past family history, past medical history, past social history, past surgical history and problem list  Chronic issues discussed: Compliant with medications, needs refills  Having ongoing problems with his knee arthritis  Uses OTC lotion.  Sometimes pain is worse.    Had recent insurance eval at his house to review health history.  Tremor right arm for years, says he inherited this from family who had the same tremor. Its unchanged.  Acute issues discussed: Poison ivy right arm from cutting hedges.  Tried calamine lotion but rash wont resolve  Objective:      Biometrics BP 132/80   Pulse 62   Wt 187 lb 6.4 oz (85 kg)   SpO2 98%   BMI 28.49 kg/m   Cognitive Testing  Alert? Yes  Normal Appearance?Yes  Oriented to person? Yes  Place? Yes   Time? Yes  Recall of three objects?  Yes  Can perform simple calculations? Yes  Displays appropriate judgment?Yes  Can read the correct time from a watch face?Yes  General appearance: alert, no distress, WD/WN, white male  Nutritional Status: Inadequate calore intake? no Loss of muscle mass? no Loss of fat beneath skin? no Localized or general edema? no Diminished functional status? no  Other pertinent exam: HEENT: normocephalic, sclerae anicteric, conjunctiva injected bilat, TMs pearly, nares patent, no discharge or erythema, pharynx normal Skin: left forearm anteriorly with generalized erythema and puffy appearance with scattered linear whealed lesions, patchy crust lesions of scalp  and crusted lesion left tragus Oral cavity: MMM, no lesions, teeth in good repair Neck: supple, no lymphadenopathy, no thyromegaly, no masses,no bruits Heart: RRR, normal S1, S2, no murmurs Lungs: CTA bilaterally, no wheezes, rhonchi, or rales Abdomen: +bs, soft, non tender, non distended, no masses, no hepatomegaly, no splenomegaly Musculoskeletal: mild tenderness of bilat knees, no obvious deformity or swelling, otherwise MSK nontender, no swelling, no obvious deformity Extremities: no edema, no cyanosis, no clubbing Pulses: 2+ symmetric, upper and lower extremities, normal cap refill Neurological: alert, oriented x 3, CN2-12 intact, strength normal upper extremities and lower extremities, sensation normal throughout, DTRs 2+ throughout, no cerebellar signs, gait normal Psychiatric: normal affect, behavior normal, pleasant  GU/rectal declined/deferred   Assessment:   Encounter Diagnoses  Name Primary?  . Medicare annual wellness visit, subsequent Yes  . Essential hypertension   . Other allergic rhinitis   . Allergic conjunctivitis, bilateral   . Gastroesophageal reflux disease, esophagitis presence not specified   . Encounter for health maintenance examination in adult   . History of prostate cancer   . Vaccine counseling   . Osteoarthritis of both knees, unspecified osteoarthritis type   . Poison ivy dermatitis   . Tremor, hereditary, benign   . Screening for diabetes mellitus      Plan:   A preventative services visit was completed today.  During the course of the visit today, we discussed and counseled about appropriate screening and preventive services.  A health risk assessment was established today that included a review of current medications, allergies, social history, family history, medical and preventative health history, biometrics, and preventative screenings to identify potential safety concerns or impairments.  A personalized plan was printed today for your  records and use.   Personalized health advice and education was given today to reduce health risks and promote self management and wellness.  Information regarding end of life planning was discussed today.  Conditions/risks identified: No new conditions  Chronic problems discussed today: C/t same medications He will return in a few days fasting for labs  Acute problems discussed today: poison ivy dermatitis - begin short term prednisone and clobetasol cream  Recommendations:  I recommend a yearly ophthalmology/optometry visit for glaucoma screening and eye checkup  I recommended a yearly dental visit for hygiene and checkup  Advanced directives - discussed nature and purpose of Advanced Directives, encouraged them to complete them if they have not done so and/or encouraged them to get [place] a copy if they have done this already.  Referrals today: none  Immunizations: I recommended a yearly influenza vaccine, typically in September when the vaccine is usually available Is the Pneumococcal vaccine up to date: yes. Is the Shingles vaccine up to date: no.   Is the Td/Tdap vaccine up to date: unsure.  Advised he check insurance coverage for shingles and Tdap vaccines  Gwin was seen today for med check.  Diagnoses and all orders for this visit:  Medicare annual wellness visit, subsequent  Essential hypertension -     Comprehensive metabolic panel; Future -     CBC; Future -     Lipid panel; Future -     TSH; Future -     Hemoglobin A1c; Future -     Microalbumin / creatinine urine ratio; Future  Other allergic rhinitis -     levocetirizine (XYZAL) 5 MG tablet; Take 1 tablet (5 mg total) by mouth every evening. -     Azelastine HCl 0.15 % SOLN; 1-2 SPRAYS IN EACH NOSTRIL DAILY  Allergic conjunctivitis, bilateral -     levocetirizine (XYZAL) 5 MG tablet; Take 1 tablet (5 mg total) by mouth every evening.  Gastroesophageal reflux disease, esophagitis presence not  specified  Encounter for health maintenance examination in adult  History of prostate cancer  Vaccine counseling  Osteoarthritis of both knees, unspecified osteoarthritis type  Poison ivy dermatitis  Tremor, hereditary, benign  Screening for diabetes mellitus -     Hemoglobin A1c; Future  Other orders -     clobetasol cream (TEMOVATE) 0.05 %; Apply 1 application topically 2 (two) times daily. -     Acetaminophen-Codeine (TYLENOL/CODEINE #3) 300-30 MG tablet; Take 1 tablet by mouth every 6 (six) hours as needed for pain. -  predniSONE (DELTASONE) 20 MG tablet; 2 tablets daily x 3 days    Medicare Attestation A preventative services visit was completed today.  During the course of the visit the patient was educated and counseled about appropriate screening and preventive services.  A health risk assessment was established with the patient that included a review of current medications, allergies, social history, family history, medical and preventative health history, biometrics, and preventative screenings to identify potential safety concerns or impairments.  A personalized plan was printed today for the patient's records and use.   Personalized health advice and education was given today to reduce health risks and promote self management and wellness.  Information regarding end of life planning was discussed today.  Crisoforo Oxford, PA-C   07/30/2016

## 2016-07-30 NOTE — Patient Instructions (Addendum)
Recommendations:  Poison ivy  begin Clobetasol cream to the right arm twice daily for NO MORE than a week  Use Prednisone 20mg , 2 tablets daily for 3 days  If not much improved within a week let me know  Allergies and sinuses  Continue Xyzal allergic tablet daily  continue or restart Azelastine nasal spray daily  Continue your other medications regulalry  Return within 7 days for fasting labs

## 2016-07-31 ENCOUNTER — Other Ambulatory Visit: Payer: Medicare Other

## 2016-07-31 DIAGNOSIS — Z131 Encounter for screening for diabetes mellitus: Secondary | ICD-10-CM | POA: Diagnosis not present

## 2016-07-31 DIAGNOSIS — I10 Essential (primary) hypertension: Secondary | ICD-10-CM | POA: Diagnosis not present

## 2016-07-31 LAB — CBC
HCT: 43.1 % (ref 38.5–50.0)
HEMOGLOBIN: 14.5 g/dL (ref 13.2–17.1)
MCH: 29.1 pg (ref 27.0–33.0)
MCHC: 33.6 g/dL (ref 32.0–36.0)
MCV: 86.4 fL (ref 80.0–100.0)
MPV: 9.4 fL (ref 7.5–12.5)
Platelets: 232 10*3/uL (ref 140–400)
RBC: 4.99 MIL/uL (ref 4.20–5.80)
RDW: 13.7 % (ref 11.0–15.0)
WBC: 9.3 10*3/uL (ref 4.0–10.5)

## 2016-07-31 LAB — LIPID PANEL
Cholesterol: 133 mg/dL (ref <200)
HDL: 39 mg/dL — ABNORMAL LOW (ref >40)
LDL Cholesterol: 64 mg/dL (ref <100)
Total CHOL/HDL Ratio: 3.4 Ratio (ref <5.0)
Triglycerides: 148 mg/dL (ref <150)
VLDL: 30 mg/dL (ref <30)

## 2016-07-31 LAB — COMPREHENSIVE METABOLIC PANEL
ALK PHOS: 44 U/L (ref 40–115)
ALT: 19 U/L (ref 9–46)
AST: 20 U/L (ref 10–35)
Albumin: 3.8 g/dL (ref 3.6–5.1)
BUN: 16 mg/dL (ref 7–25)
CALCIUM: 9.4 mg/dL (ref 8.6–10.3)
CO2: 26 mmol/L (ref 20–31)
Chloride: 102 mmol/L (ref 98–110)
Creat: 0.79 mg/dL (ref 0.70–1.18)
GLUCOSE: 92 mg/dL (ref 65–99)
POTASSIUM: 4 mmol/L (ref 3.5–5.3)
Sodium: 138 mmol/L (ref 135–146)
TOTAL PROTEIN: 6.8 g/dL (ref 6.1–8.1)
Total Bilirubin: 0.7 mg/dL (ref 0.2–1.2)

## 2016-07-31 LAB — TSH: TSH: 1.53 mIU/L (ref 0.40–4.50)

## 2016-08-01 LAB — HEMOGLOBIN A1C
HEMOGLOBIN A1C: 5.7 % — AB (ref <5.7)
MEAN PLASMA GLUCOSE: 117 mg/dL

## 2016-08-01 LAB — MICROALBUMIN / CREATININE URINE RATIO

## 2016-08-03 ENCOUNTER — Other Ambulatory Visit: Payer: Self-pay | Admitting: Medical

## 2016-08-03 MED ORDER — ATORVASTATIN CALCIUM 10 MG PO TABS: 10.0000 mg | ORAL_TABLET | Freq: Every day | ORAL | 3 refills | Status: DC

## 2016-08-03 MED ORDER — LISINOPRIL-HYDROCHLOROTHIAZIDE 20-12.5 MG PO TABS: 1.0000 | ORAL_TABLET | Freq: Every day | ORAL | 3 refills | Status: DC

## 2016-08-03 MED ORDER — VERAPAMIL HCL ER 240 MG PO TBCR: 240.0000 mg | EXTENDED_RELEASE_TABLET | Freq: Every day | ORAL | 3 refills | Status: DC

## 2016-08-03 MED ORDER — FAMOTIDINE 40 MG PO TABS: 40.0000 mg | ORAL_TABLET | Freq: Every day | ORAL | 3 refills | Status: DC

## 2016-08-04 ENCOUNTER — Telehealth: Payer: Self-pay | Admitting: Medical

## 2016-08-04 NOTE — Telephone Encounter (Signed)
Called pt to setup up medicare well visit he stated that he would call us back to set up

## 2016-08-05 ENCOUNTER — Other Ambulatory Visit: Payer: Self-pay | Admitting: Allergy and Immunology

## 2016-08-05 DIAGNOSIS — J3089 Other allergic rhinitis: Secondary | ICD-10-CM

## 2016-08-05 DIAGNOSIS — H1013 Acute atopic conjunctivitis, bilateral: Secondary | ICD-10-CM

## 2016-08-05 NOTE — Telephone Encounter (Signed)
Gave 1 refill with no additional refills for Xyzal. Patient was last seen 08/08/15 was asked to follow up in 6 months. Patient needs office visit for further refills.

## 2016-09-01 ENCOUNTER — Telehealth: Payer: Self-pay | Admitting: Medical

## 2016-09-01 NOTE — Telephone Encounter (Signed)
Called and left message that pt needs to schedule a medicare well visit with shane tysinger

## 2016-09-03 ENCOUNTER — Telehealth: Payer: Self-pay | Admitting: Medical

## 2016-09-03 NOTE — Telephone Encounter (Signed)
Called 3 times to see if we could get Matthew Mckay to come in for a medicare well visit with shane left message with pt to give [place] a call back

## 2016-09-21 ENCOUNTER — Emergency Department (HOSPITAL_COMMUNITY): Payer: Medicare Other

## 2016-09-21 ENCOUNTER — Encounter (HOSPITAL_COMMUNITY): Payer: Self-pay | Admitting: Emergency Medicine

## 2016-09-21 ENCOUNTER — Emergency Department (HOSPITAL_COMMUNITY)
Admission: EM | Admit: 2016-09-21 | Discharge: 2016-09-21 | Disposition: A | Discharge status: Home / Self Care | Payer: Medicare Other | Attending: Emergency Medicine | Admitting: Emergency Medicine

## 2016-09-21 DIAGNOSIS — Y939 Activity, unspecified: Secondary | ICD-10-CM | POA: Diagnosis not present

## 2016-09-21 DIAGNOSIS — Z79899 Other long term (current) drug therapy: Secondary | ICD-10-CM | POA: Insufficient documentation

## 2016-09-21 DIAGNOSIS — I10 Essential (primary) hypertension: Secondary | ICD-10-CM | POA: Insufficient documentation

## 2016-09-21 DIAGNOSIS — Y999 Unspecified external cause status: Secondary | ICD-10-CM | POA: Insufficient documentation

## 2016-09-21 DIAGNOSIS — S199XXA Unspecified injury of neck, initial encounter: Secondary | ICD-10-CM | POA: Diagnosis not present

## 2016-09-21 DIAGNOSIS — S3993XA Unspecified injury of pelvis, initial encounter: Secondary | ICD-10-CM | POA: Diagnosis not present

## 2016-09-21 DIAGNOSIS — R0781 Pleurodynia: Secondary | ICD-10-CM | POA: Diagnosis not present

## 2016-09-21 DIAGNOSIS — Z8546 Personal history of malignant neoplasm of prostate: Secondary | ICD-10-CM | POA: Insufficient documentation

## 2016-09-21 DIAGNOSIS — S22088A Other fracture of T11-T12 vertebra, initial encounter for closed fracture: Secondary | ICD-10-CM | POA: Diagnosis not present

## 2016-09-21 DIAGNOSIS — W11XXXA Fall on and from ladder, initial encounter: Secondary | ICD-10-CM | POA: Insufficient documentation

## 2016-09-21 DIAGNOSIS — M542 Cervicalgia: Secondary | ICD-10-CM | POA: Diagnosis not present

## 2016-09-21 DIAGNOSIS — S22020A Wedge compression fracture of second thoracic vertebra, initial encounter for closed fracture: Secondary | ICD-10-CM | POA: Diagnosis not present

## 2016-09-21 DIAGNOSIS — Y929 Unspecified place or not applicable: Secondary | ICD-10-CM | POA: Diagnosis not present

## 2016-09-21 DIAGNOSIS — S299XXA Unspecified injury of thorax, initial encounter: Secondary | ICD-10-CM | POA: Diagnosis not present

## 2016-09-21 DIAGNOSIS — S22000A Wedge compression fracture of unspecified thoracic vertebra, initial encounter for closed fracture: Secondary | ICD-10-CM

## 2016-09-21 DIAGNOSIS — M546 Pain in thoracic spine: Secondary | ICD-10-CM | POA: Diagnosis not present

## 2016-09-21 DIAGNOSIS — R52 Pain, unspecified: Secondary | ICD-10-CM

## 2016-09-21 DIAGNOSIS — M545 Low back pain: Secondary | ICD-10-CM | POA: Diagnosis not present

## 2016-09-21 MED ORDER — DIAZEPAM 5 MG PO TABS: 2.5000 mg | ORAL_TABLET | Freq: Three times a day (TID) | ORAL | 0 refills | Status: DC | PRN

## 2016-09-21 MED ORDER — DOCUSATE SODIUM 100 MG PO CAPS
100.0000 mg | ORAL_CAPSULE | Freq: Two times a day (BID) | ORAL | 0 refills | Status: DC
Start: 2016-09-21 — End: 2016-09-24

## 2016-09-21 MED ORDER — OXYCODONE-ACETAMINOPHEN 5-325 MG PO TABS
1.0000 | ORAL_TABLET | Freq: Once | ORAL | Status: AC
  Administered 2016-09-21: 1 via ORAL
  Filled 2016-09-21: qty 1

## 2016-09-21 MED ORDER — LORAZEPAM 1 MG PO TABS
1.0000 mg | ORAL_TABLET | Freq: Once | ORAL | Status: AC
  Administered 2016-09-21: 1 mg via ORAL
  Filled 2016-09-21: qty 1

## 2016-09-21 MED ORDER — HYDROCODONE-ACETAMINOPHEN 5-325 MG PO TABS: 1.0000 | ORAL_TABLET | Freq: Four times a day (QID) | ORAL | 0 refills | Status: DC | PRN

## 2016-09-21 MED ORDER — IBUPROFEN 400 MG PO TABS
600.0000 mg | ORAL_TABLET | Freq: Once | ORAL | Status: AC
  Administered 2016-09-21: 15:00:00 600 mg via ORAL
  Filled 2016-09-21: qty 1

## 2016-09-21 NOTE — ED Notes (Signed)
Pt back from CT

## 2016-09-21 NOTE — ED Notes (Signed)
Pt going to CT

## 2016-09-21 NOTE — ED Provider Notes (Signed)
[facility] DEPT Provider Note   CSN: [id number] Arrival date & time: 09/21/16  1224  By signing my name below, I, Levester Fresh, attest that this documentation has been prepared under the direction and in the presence of Virgel Manifold, MD . Electronically Signed: Levester Fresh, Scribe. 09/21/2016. 4:38 PM.  History   Chief Complaint Chief Complaint  Patient presents with  . Fall   HPI Comments Matthew Mckay is a 76 y.o. male with a PMHx significant for HTN and GERD, who presents to the Emergency Department with complaints of lower back pain and neck pain s/p falling ~8 feet off of a ladder.  Left palm pain from impact with the ladder.  Pt was descending the ladder when it slid out from under him and he fell to the ground, striking his left side.  Denies head impact or LOC. No numbness, tingling or weakness.  He is ambulatory in the ED, but notes that pain is worse while doing so.  Denies use of blood thinners.  Pt denies experiencing any other acute sx, including chest pain, abdominal pain, nausea or vomiting.  The history is provided by the patient and medical records. No language interpreter was used.   Past Medical History:  Diagnosis Date  . Allergy   . Anxiety   . Chronic sinusitis   . Diverticulosis    sigmoid colon and increased vascularization due to prior radiation, per 2013 colonoscopy  . GERD (gastroesophageal reflux disease)   . Hemorrhoid   . Hypertension   . Prostate cancer [facility])    history of radiation therapy   Patient Active Problem List   Diagnosis Date Noted  . Medicare annual wellness visit, subsequent 07/30/2016  . Allergic conjunctivitis, bilateral 07/30/2016  . 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 Poison ivy dermatitis 07/30/2016  . 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 Screening for diabetes mellitus 07/30/2016  . GERD (gastroesophageal reflux disease) 08/08/2015  . Essential hypertension 02/12/2015  . Encounter for health  maintenance examination in adult 02/12/2015  . Other allergic rhinitis 02/12/2015  . History of prostate cancer 02/12/2015  . Need for prophylactic vaccination against Streptococcus pneumoniae (pneumococcus) 02/12/2015  . Vaccine counseling 02/12/2015   Past Surgical History:  Procedure Laterality Date  . COLONOSCOPY  02/2012   sigmoid diverticulosis, polyps, Dr. Clarene Essex  . PROSTATECTOMY     1990s    Home Medications    Prior to Admission medications   Medication Sig Start Date End Date Taking? Authorizing Provider  Acetaminophen-Codeine (TYLENOL/CODEINE #3) 300-30 MG tablet Take 1 tablet by mouth every 6 (six) hours as needed for pain. 07/30/16   Tysinger, Camelia Eng, PA-C  atorvastatin (LIPITOR) 10 MG tablet Take 1 tablet (10 mg total) by mouth daily. 08/03/16   Tysinger, Camelia Eng, PA-C  Azelastine HCl 0.15 % SOLN 1-2 SPRAYS IN [facility] NOSTRIL DAILY 07/30/16   Tysinger, Camelia Eng, PA-C  clobetasol cream (TEMOVATE) 9.56 % Apply 1 application topically 2 (two) times daily. 07/30/16   Tysinger, Camelia Eng, PA-C  famotidine (PEPCID) 40 MG tablet Take 1 tablet (40 mg total) by mouth daily. 08/03/16   Tysinger, Camelia Eng, PA-C  levocetirizine (XYZAL) 5 MG tablet Take 1 tablet (5 mg total) by mouth every evening. 07/30/16   Tysinger, Camelia Eng, PA-C  levocetirizine (XYZAL) 5 MG tablet TAKE 1 TABLET (5 MG TOTAL) BY MOUTH EVERY EVENING. 08/05/16   Bobbitt, Sedalia Muta, MD  lisinopril-hydrochlorothiazide (PRINZIDE,ZESTORETIC) 20-12.5 MG tablet Take 1 tablet by mouth daily. 08/03/16  Tysinger, Camelia Eng, PA-C  Olopatadine HCl (PAZEO) 0.7 % SOLN Place 1 drop into both eyes 1 day or 1 dose. 08/08/15   Bobbitt, Sedalia Muta, MD  predniSONE (DELTASONE) 20 MG tablet 2 tablets daily x 3 days 07/30/16   Tysinger, Camelia Eng, PA-C  triamcinolone cream (KENALOG) 0.1 % Apply 1 application topically 2 (two) times daily. 09/02/15   Tysinger, Camelia Eng, PA-C  trimethoprim-polymyxin b (POLYTRIM) ophthalmic solution Place 2 drops into both  eyes every 4 (four) hours. 08/28/14   Tysinger, Camelia Eng, PA-C  verapamil (CALAN-SR) 240 MG CR tablet Take 1 tablet (240 mg total) by mouth at bedtime. 08/03/16   Tysinger, Camelia Eng, PA-C   Family History Family History  Problem Relation Age of Onset  . Cancer Brother        stomach  . Arthritis Brother   . Arthritis Brother   . Hypertension Brother   . Hypertension Brother   . Hypertension Mother   . Cancer Father        lung, smoked 2 ppd  . Cancer Paternal Uncle        cancer  . Cancer Paternal Uncle        stomach   Social History Social History  Substance Use Topics  . Smoking status: Never Smoker  . Smokeless tobacco: Never Used  . Alcohol use No   Allergies   Patient has no known allergies.  Review of Systems Review of Systems  Constitutional: Negative for chills and fever.  Respiratory: Negative for shortness of breath.   Cardiovascular: Negative for chest pain.  Gastrointestinal: Negative for abdominal pain, nausea and vomiting.  Musculoskeletal: Positive for back pain and neck pain.  Neurological: Negative for weakness and numbness.  All other systems reviewed and are negative.  Physical Exam Updated Vital Signs BP (!) 147/85   Pulse 77   Temp 98.2 F (36.8 C) (Oral)   Resp 18   SpO2 100%   Physical Exam  Constitutional: He is oriented to person, place, and time. He appears well-developed and well-nourished.  HENT:  Head: Normocephalic and atraumatic.  Eyes: EOM are normal.  Neck: Normal range of motion.  Cardiovascular: Normal rate, regular rhythm, normal heart sounds and intact distal pulses.   Pulmonary/Chest: Effort normal and breath sounds normal. No respiratory distress.  Abdominal: Soft. There is no tenderness.  Musculoskeletal: Normal range of motion.  Tender midline upper thoracic back. Some abrasio to the right thoracic back.  No tenderness to the midline in the lumbar region. Able to sit up in bed unassisted.  Neurological: He is alert and  oriented to person, place, and time.  Strength normal to upper and lower extremities.  Skin: Skin is warm and dry.  Psychiatric: He has a normal mood and affect. Judgment normal.  Nursing note and vitals reviewed.  ED Treatments / Results  DIAGNOSTIC STUDIES: Oxygen Saturation is 100% on room air, normal by my interpretation.    COORDINATION OF CARE: 2:15 PM Discussed treatment plan with pt at bedside and pt agreed to plan.  Labs (all labs ordered are listed, but only abnormal results are displayed) Labs Reviewed - No data to display  EKG  EKG Interpretation None       Radiology Dg Ribs Unilateral W/chest Left  Result Date: 09/21/2016 CLINICAL DATA:  Patient fell off a ladder, landed on LEFT side of chest. Complains of LEFT posterior rib pain. EXAM: LEFT RIBS AND CHEST - 3+ VIEW COMPARISON:  Chest radiograph 02/05/2008. FINDINGS: A  small BB was placed in the area of clinical concern. No fractures are seen involving the ribs. There is no evidence of pneumothorax or pleural effusion. Both lungs are clear. Heart size and mediastinal contours are within normal limits. IMPRESSION: Negative.  Similar appearance of the chest compared to priors. Electronically Signed   By: Staci Righter M.D.   On: 09/21/2016 16:06   Dg Thoracic Spine 2 View  Result Date: 09/21/2016 CLINICAL DATA:  Fall from ladder landing on left side. Left upper back pain. Initial encounter. EXAM: THORACIC SPINE 2 VIEWS COMPARISON:  None. FINDINGS: T12 superior endplate concavity and anterior cortex lucency. T9 also shows mild wedging that is new from 2009 chest x-ray. No fracture lucency seen at T9. No subluxation. Lower thoracic spondylosis. IMPRESSION: 1. Acute appearing T12 compression fracture with mild height loss. 2. Mild T9 vertebral body wedging that is new from 2009, age-indeterminate. Electronically Signed   By: Monte Fantasia M.D.   On: 09/21/2016 16:08   Dg Lumbar Spine Complete  Result Date:  09/21/2016 CLINICAL DATA:  Fall from ladder.  Back pain. Initial encounter. EXAM: LUMBAR SPINE - COMPLETE 4+ VIEW COMPARISON:  None. FINDINGS: Note that the full view lateral image actually has 2 stacked images that are available with scrolling. There is a transitional S1 vertebra based on the lowest ribs. Acute superior endplate fractures of R44 and L1 with less than 25% height loss. No indication of retropulsion or subluxation. Usual degenerative changes for age.  Atherosclerotic calcification. IMPRESSION: Acute appearing T12 and L1 superior endplate fractures with mild height loss. Electronically Signed   By: Monte Fantasia M.D.   On: 09/21/2016 16:27   Dg Pelvis 1-2 Views  Result Date: 09/21/2016 CLINICAL DATA:  Fall from ladder. EXAM: PELVIS - 1-2 VIEW COMPARISON:  12/07/2008 abdomen radiographs. FINDINGS: Surgical clips overlie the deep pelvis bilaterally. No pelvic fracture or diastasis. No suspicious focal osseous lesions. No evidence of hip dislocation on this single frontal view. Mild enthesophytosis in the pelvic girdle bilaterally. IMPRESSION: No fracture. Electronically Signed   By: Ilona Sorrel M.D.   On: 09/21/2016 16:04   Dg Sacrum/coccyx  Result Date: 09/21/2016 CLINICAL DATA:  Fall from ladder.  Sacrococcygeal pain. EXAM: SACRUM AND COCCYX - 2+ VIEW COMPARISON:  None. FINDINGS: Surgical clips overlie the deep pelvis bilaterally. No fracture or suspicious focal osseous lesion. Normal variant anterior angulation of the distal coccyx. No evidence of pelvic diastasis or hip dislocation. IMPRESSION: No fracture. Electronically Signed   By: Ilona Sorrel M.D.   On: 09/21/2016 16:05   Ct Cervical Spine Wo Contrast  Result Date: 09/21/2016 CLINICAL DATA:  Fall from a ladder onto his left side today. Clinical concern for cervical spine fracture. EXAM: CT CERVICAL SPINE WITHOUT CONTRAST TECHNIQUE: Multidetector CT imaging of the cervical spine was performed without intravenous contrast. Multiplanar  CT image reconstructions were also generated. COMPARISON:  None. FINDINGS: Alignment: Straightening of the normal cervical lordosis. Minimal retrolisthesis at the C3-4 level. Skull base and vertebrae: No acute fracture. No primary bone lesion or focal pathologic process. Soft tissues and spinal canal: No prevertebral fluid or swelling. No visible canal hematoma. Disc levels: Moderate broad-based central disc protrusion of the C3-4 level with moderate canal stenosis. Mild to moderate diffuse disc extrusion at the C4-5 level with mild to moderate canal stenosis. Moderate diffuse disc protrusion and bilateral uncinate spur formation, producing moderate canal stenosis, moderate marked foraminal stenosis on the left and moderate foraminal stenosis on the right at the C5-6  level. Mild to moderate diffuse disc protrusion and mild bilateral uncinate spur formation producing mild to moderate canal stenosis and mild moderate bilateral foraminal stenosis at the C6-7 level. Upper chest: Clear lung apices. Other: Mild right and minimal left carotid artery calcification. IMPRESSION: 1. Straightening of the normal cervical lordosis with no fracture or traumatic subluxation. 2. Multilevel degenerative changes, as described above. 3. Mild right and minimal left carotid artery atheromatous calcification. Electronically Signed   By: Claudie Revering M.D.   On: 09/21/2016 16:31    Procedures Procedures (including critical care time)  Medications Ordered in ED Medications  LORazepam (ATIVAN) tablet 1 mg (1 mg Oral Given 09/21/16 1452)  oxyCODONE-acetaminophen (PERCOCET/ROXICET) 5-325 MG per tablet 1 tablet (1 tablet Oral Given 09/21/16 1452)  ibuprofen (ADVIL,MOTRIN) tablet 600 mg (600 mg Oral Given 09/21/16 1451)     Initial Impression / Assessment and Plan / ED Course  I have reviewed the triage vital signs and the nursing notes.  Pertinent labs & imaging results that were available during my care of the patient were reviewed  by me and considered in my medical decision making (see chart for details).    75yM with back pain after fall. Imaging with acute appearing t12 fx and age indeterminant t9 fx. No neuro complaints. Nonfocal neuro exam. Pain reasonably controlled. Plan continued PRN pain meds. NS FU.  It has been determined that no acute conditions requiring further emergency intervention are present at this time. The patient has been advised of the diagnosis and plan. I reviewed any labs and imaging including any potential incidental findings. We have discussed signs and symptoms that warrant return to the ED and they are listed in the discharge instructions.   I personally preformed the services scribed in my presence. The recorded information has been reviewed is accurate. Virgel Manifold, MD.   Final Clinical Impressions(s) / ED Diagnoses   Final diagnoses:  Compression fracture of body of thoracic vertebra [facility])    New Prescriptions New Prescriptions   No medications on file     Virgel Manifold, MD 09/21/16 1659

## 2016-09-21 NOTE — ED Triage Notes (Addendum)
Pt reports he was working on a 17 foot aluminum ladder, as he was descending the ladder at the halfway point the ladder slid out from under him and he fell to the ground onto his left side, pt denies loc, c/o lower back pain. Pt ambulatory in triage. Denies numbness or tingling. Pt a/ox4, resp e/u, denies blood thinner use.

## 2016-09-21 NOTE — ED Notes (Signed)
Pts requesting something for pain in waiting room. Advised family of the delay.

## 2016-09-22 ENCOUNTER — Telehealth: Payer: Self-pay | Admitting: Medical

## 2016-09-22 NOTE — Telephone Encounter (Signed)
Pt's daughter called to let Audelia Acton know that pt fell off of a ladder yesterday and was seen in ER for broken vertebrae. Pt is home in in a lot of pain and throwing up from time to time. Hospital advise daughter that pt is likely throwing up due to the meds he was given. Daughter is home caring for her dad. She wanted to give Audelia Acton an update on pt and let him know that her job will likely send FMLA paperwork for her to be home to care for pt.

## 2016-09-23 NOTE — Telephone Encounter (Signed)
Please let him know that we are thinking about him.   I hate that he is in pain and had the injury.  Glad to hear his daughter is helping watch after him.   I recommend a follow up appt particularly if he is having a lot of difficulty at this time with the nausea still or if he isn't getting pain relief.

## 2016-09-24 ENCOUNTER — Ambulatory Visit (INDEPENDENT_AMBULATORY_CARE_PROVIDER_SITE_OTHER): Payer: Medicare Other | Admitting: Medical

## 2016-09-24 ENCOUNTER — Encounter: Payer: Self-pay | Admitting: Medical

## 2016-09-24 VITALS — BP 134/68 | HR 65 | Wt 186.4 lb

## 2016-09-24 DIAGNOSIS — S300XXD Contusion of lower back and pelvis, subsequent encounter: Secondary | ICD-10-CM | POA: Diagnosis not present

## 2016-09-24 DIAGNOSIS — Z79899 Other long term (current) drug therapy: Secondary | ICD-10-CM | POA: Diagnosis not present

## 2016-09-24 DIAGNOSIS — S22080A Wedge compression fracture of T11-T12 vertebra, initial encounter for closed fracture: Secondary | ICD-10-CM | POA: Diagnosis not present

## 2016-09-24 DIAGNOSIS — S300XXA Contusion of lower back and pelvis, initial encounter: Secondary | ICD-10-CM | POA: Insufficient documentation

## 2016-09-24 DIAGNOSIS — I6523 Occlusion and stenosis of bilateral carotid arteries: Secondary | ICD-10-CM | POA: Diagnosis not present

## 2016-09-24 DIAGNOSIS — W11XXXD Fall on and from ladder, subsequent encounter: Secondary | ICD-10-CM | POA: Diagnosis not present

## 2016-09-24 DIAGNOSIS — W11XXXA Fall on and from ladder, initial encounter: Secondary | ICD-10-CM | POA: Insufficient documentation

## 2016-09-24 MED ORDER — HYDROCODONE-ACETAMINOPHEN 5-325 MG PO TABS: 1.0000 | ORAL_TABLET | Freq: Four times a day (QID) | ORAL | 0 refills | Status: DC | PRN

## 2016-09-24 MED ORDER — DOCUSATE SODIUM 100 MG PO CAPS: 100.0000 mg | ORAL_CAPSULE | Freq: Two times a day (BID) | ORAL | 0 refills | Status: DC

## 2016-09-24 MED ORDER — DIAZEPAM 5 MG PO TABS: 2.5000 mg | ORAL_TABLET | Freq: Three times a day (TID) | ORAL | 0 refills | Status: DC | PRN

## 2016-09-24 NOTE — Progress Notes (Signed)
Subjective: Chief Complaint  Patient presents with  . Hospitalization Follow-up    fell off the roof, hurt  arm and back.    Here for ED f/u.  accompanied by daughter Matthew Mckay who is helping take care of him for the next month She needs FMLA for her employer since she is helping take care of him  He recently was seen in the emergency dept after falling 8 feet off a ladder.  Went in complaining of neck and back pain.  Ladder slid out from under him.   Fell on his left side.   Denied LOC or head impact.  In ED, was found to have T12/L1 compression fracture.   Was discharged on Valium and Hydrocodone.  Helping some with pain but making him sleepy.  Currently pain ranges from 5-7/10.    Has pain in buttocks and left low back.  Not so much pain in mid back.     Past Medical History:  Diagnosis Date  . Allergy   . Anxiety   . Chronic sinusitis   . Diverticulosis    sigmoid colon and increased vascularization due to prior radiation, per 2013 colonoscopy  . GERD (gastroesophageal reflux disease)   . Hemorrhoid   . Hypertension   . Prostate cancer [facility])    history of radiation therapy   Current Outpatient Prescriptions on File Prior to Visit  Medication Sig Dispense Refill  . atorvastatin (LIPITOR) 10 MG tablet Take 1 tablet (10 mg total) by mouth daily. 90 tablet 3  . Azelastine HCl 0.15 % SOLN 1-2 SPRAYS IN EACH NOSTRIL DAILY 30 mL 11  . clobetasol cream (TEMOVATE) 9.44 % Apply 1 application topically 2 (two) times daily. 30 g 0  . diazepam (VALIUM) 5 MG tablet Take 0.5-1 tablets (2.5-5 mg total) by mouth every 8 (eight) hours as needed for muscle spasms. 10 tablet 0  . docusate sodium (COLACE) 100 MG capsule Take 1 capsule (100 mg total) by mouth 2 (two) times daily. While taking pain medication 20 capsule 0  . famotidine (PEPCID) 40 MG tablet Take 1 tablet (40 mg total) by mouth daily. 90 tablet 3  . HYDROcodone-acetaminophen (NORCO/VICODIN) 5-325 MG tablet Take 1-2 tablets by mouth  every 6 (six) hours as needed. 20 tablet 0  . levocetirizine (XYZAL) 5 MG tablet Take 1 tablet (5 mg total) by mouth every evening. 30 tablet 11  . lisinopril-hydrochlorothiazide (PRINZIDE,ZESTORETIC) 20-12.5 MG tablet Take 1 tablet by mouth daily. 90 tablet 3  . Olopatadine HCl (PAZEO) 0.7 % SOLN Place 1 drop into both eyes 1 day or 1 dose. 1 Bottle 5  . triamcinolone cream (KENALOG) 0.1 % Apply 1 application topically 2 (two) times daily. 30 g 0  . verapamil (CALAN-SR) 240 MG CR tablet Take 1 tablet (240 mg total) by mouth at bedtime. 90 tablet 3   No current facility-administered medications on file prior to visit.    ROS as in subjective   Objective: BP 134/68   Pulse 65   Wt 186 lb 6.4 oz (84.6 kg)   SpO2 96%   BMI 28.34 kg/m   Gen: wd, wn, nad Dark purplish bruising along gluteal clef bilat and across upper sacral region, tender in same area. Tender left lower back paraspinal.  Pain in mid to lower back transferring form seated position to standing.  ROM about 50% of normal due to pain.  Not so much pain noted walking.   bilat hip internal ROM mildly reduced but nontender, no leg tenderness  or swelling No ext edema Legs neurovascularly intact Neck nontender, normal ROM.    Assessment: Encounter Diagnoses  Name Primary?  . Compression fracture of T12 vertebra (HCC) Yes  . Fall from ladder, subsequent encounter   . Contusion of buttock, subsequent encounter   . High risk medication use     Plan: Reviewed ED report, all imaging including xrays and CT neck.    Plan to refer to neurosurgery ASAP for possible kyphoplasty  Refer for carotid ultrasound given calcifications seen on CT neck  recommendations as below  Patient Instructions  Recommendations  Use the Hydrocodone pain medication as you are doing, 3 times daily at 7am, 1pm, and  7pm  Use the Valium/Diazepam 1-2 times daily as needed for spasm or stiffness of muscle  Preferably use the Diazepam space out  3-4 hours from the Hydrocodone  Continue the stool softener Docusate twice daily while on the pain medication  don't start the Tylenol with CCodeinethat I had pprescribedfor knee pain while on the Hydrocodone.   Use gentle stretching daily  Avoid falls!!  We will refer you to neurosurgery ASAP for possible procedure to stabilize the fracture  Let me know if you knee a cane or walker  Regarding the calcifications in the neck seen on xray, we can consider ultrasounds of the carotids to look for blockages.    Isidor was seen today for hospitalization follow-up.  Diagnoses and all orders for this visit:  Compression fracture of T12 vertebra [facility]) -     Ambulatory referral to Neurosurgery  Fall from ladder, subsequent encounter -     Ambulatory referral to Neurosurgery  Contusion of buttock, subsequent encounter  High risk medication use

## 2016-09-24 NOTE — Patient Instructions (Addendum)
Recommendations  Use the Hydrocodone pain medication as you are doing, 3 times daily at 7am, 1pm, and  7pm  Use the Valium/Diazepam 1-2 times daily as needed for spasm or stiffness of muscle  Preferably use the Diazepam space out 3-4 hours from the Hydrocodone  Continue the stool softener Docusate twice daily while on the pain medication  don't start the Tylenol with CCodeinethat I had pprescribedfor knee pain while on the Hydrocodone.   Use gentle stretching daily  Avoid falls!!  We will refer you to neurosurgery ASAP for possible procedure to stabilize the fracture  Let me know if you knee a cane or walker  Regarding the calcifications in the neck seen on xray, we can consider ultrasounds of the carotids to look for blockages.

## 2016-09-28 ENCOUNTER — Telehealth: Payer: Self-pay | Admitting: Medical

## 2016-09-28 NOTE — Telephone Encounter (Signed)
What is the appt date /time for neurosurgery?  Call out #1 back brace for support

## 2016-09-28 NOTE — Telephone Encounter (Signed)
Pt's daughter, [name], called requesting a script for a back brace. Pt is still in a lot of pain. He is exercising as instructed by Audelia Acton. He has difficulty getting comfortable in bed to sleep, to sit, or after moving around. They have been using pillows to try to give him comfort but a back brace would be helpful at times.

## 2016-09-29 NOTE — Telephone Encounter (Signed)
I already took care of this yesterday.

## 2016-09-29 NOTE — Telephone Encounter (Signed)
He had an appt on last Friday at [place] [facility]  But  dr.eliser was called into surgery  ,he had to r/s until Wednesday.

## 2016-09-30 DIAGNOSIS — S22089D Unspecified fracture of T11-T12 vertebra, subsequent encounter for fracture with routine healing: Secondary | ICD-10-CM | POA: Diagnosis not present

## 2016-09-30 DIAGNOSIS — S32019D Unspecified fracture of first lumbar vertebra, subsequent encounter for fracture with routine healing: Secondary | ICD-10-CM | POA: Diagnosis not present

## 2016-09-30 DIAGNOSIS — M549 Dorsalgia, unspecified: Secondary | ICD-10-CM | POA: Diagnosis not present

## 2016-10-15 ENCOUNTER — Encounter: Payer: Self-pay | Admitting: Medical

## 2016-10-15 ENCOUNTER — Ambulatory Visit (INDEPENDENT_AMBULATORY_CARE_PROVIDER_SITE_OTHER): Payer: Medicare Other | Admitting: Medical

## 2016-10-15 VITALS — BP 116/80 | HR 65 | Wt 182.0 lb

## 2016-10-15 DIAGNOSIS — S300XXD Contusion of lower back and pelvis, subsequent encounter: Secondary | ICD-10-CM | POA: Diagnosis not present

## 2016-10-15 DIAGNOSIS — S22080A Wedge compression fracture of T11-T12 vertebra, initial encounter for closed fracture: Secondary | ICD-10-CM | POA: Diagnosis not present

## 2016-10-15 DIAGNOSIS — M545 Low back pain, unspecified: Secondary | ICD-10-CM | POA: Insufficient documentation

## 2016-10-15 DIAGNOSIS — M25522 Pain in left elbow: Secondary | ICD-10-CM

## 2016-10-15 DIAGNOSIS — W11XXXD Fall on and from ladder, subsequent encounter: Secondary | ICD-10-CM | POA: Diagnosis not present

## 2016-10-15 MED ORDER — HYDROCODONE-ACETAMINOPHEN 5-325 MG PO TABS: ORAL_TABLET | ORAL | 0 refills | Status: DC

## 2016-10-15 MED ORDER — IBUPROFEN 600 MG PO TABS: ORAL_TABLET | ORAL | 0 refills | Status: DC

## 2016-10-15 NOTE — Patient Instructions (Signed)
  Recommendations  Use the Hydrocodone pain medication at bedtime and around lunch time if needed  Begin Ibuprofen 600mg , once in the morning, and once in afternoon around 5-6 pm if needed  Continue the stool softener Docusate twice daily as needed while on the pain medication  STOP Valium/Diazepam   Use gentle stretching daily  Avoid falls!!

## 2016-10-15 NOTE — Progress Notes (Signed)
Subjective: Chief Complaint  Patient presents with  . Follow-up    follow up falls, still having pain at night   Here for f/u on fall.  I saw him 09/24/16 for original hospital f/u.    He fell on 09/21/16 off ladder.   Since last visit he completed the prescriptions for hydrocodone and diazepam, colace, as instructed.   He is much improved but still having some right low back pain and left elbow pain.    He saw [facility], Dr. Kathyrn Sheriff, but was advised not to have surgery for compression fracture/hypoplasty per his recollection.   We don't have the neurosurgery notes.  Pain worse 3-4 am and when washing dishes during the day, but not having pain when walking.  All the bruising is gone now.  Vicks vapor rub at night really helps. He is left handed.   From last visit notes,  He recently was seen in the emergency dept after falling 8 feet off a ladder.  Went in complaining of neck and back pain.  Ladder slid out from under him.   Fell on his left side.   Denied LOC or head impact.  In ED, was found to have T12/L1 compression fracture.   Was discharged on Valium and Hydrocodone.  Helping some with pain but making him sleepy.  Currently pain ranges from 5-7/10.    Has pain in buttocks and left low back.  Not so much pain in mid back.     Past Medical History:  Diagnosis Date  . Allergy   . Anxiety   . Chronic sinusitis   . Diverticulosis    sigmoid colon and increased vascularization due to prior radiation, per 2013 colonoscopy  . GERD (gastroesophageal reflux disease)   . Hemorrhoid   . Hypertension   . Prostate cancer [facility])    history of radiation therapy   Current Outpatient Prescriptions on File Prior to Visit  Medication Sig Dispense Refill  . atorvastatin (LIPITOR) 10 MG tablet Take 1 tablet (10 mg total) by mouth daily. 90 tablet 3  . Azelastine HCl 0.15 % SOLN 1-2 SPRAYS IN EACH NOSTRIL DAILY 30 mL 11  . clobetasol cream (TEMOVATE) 2.99 % Apply 1 application topically 2 (two)  times daily. 30 g 0  . diazepam (VALIUM) 5 MG tablet Take 0.5-1 tablets (2.5-5 mg total) by mouth every 8 (eight) hours as needed for muscle spasms. 10 tablet 0  . docusate sodium (COLACE) 100 MG capsule Take 1 capsule (100 mg total) by mouth 2 (two) times daily. While taking pain medication 30 capsule 0  . famotidine (PEPCID) 40 MG tablet Take 1 tablet (40 mg total) by mouth daily. 90 tablet 3  . levocetirizine (XYZAL) 5 MG tablet Take 1 tablet (5 mg total) by mouth every evening. 30 tablet 11  . lisinopril-hydrochlorothiazide (PRINZIDE,ZESTORETIC) 20-12.5 MG tablet Take 1 tablet by mouth daily. 90 tablet 3  . Olopatadine HCl (PAZEO) 0.7 % SOLN Place 1 drop into both eyes 1 day or 1 dose. 1 Bottle 5  . triamcinolone cream (KENALOG) 0.1 % Apply 1 application topically 2 (two) times daily. 30 g 0  . verapamil (CALAN-SR) 240 MG CR tablet Take 1 tablet (240 mg total) by mouth at bedtime. 90 tablet 3   No current facility-administered medications on file prior to visit.    ROS as in subjective   Objective: BP 116/80   Pulse 65   Wt 182 lb (82.6 kg)   SpO2 97%   BMI 27.67  kg/m   Gen: wd, wn, nad All of the dark purplish bruising along gluteal clef bilat and across upper sacral region from last visit has resolved Is still a little tender left lower back paraspinal.  Pain in mid to lower back transferring form seated position to standing.  ROM of back full today.  Tender over left lateral epicondylitis of elbow today, but no deformity. Mild pain with left elbow ROM.  Not so much pain noted walking.   bilat hip internal ROM mildly reduced but non tender, no leg tenderness or swelling No ext edema Legs neurovascularly intact Neck non tender, normal ROM.    Assessment: Encounter Diagnoses  Name Primary?  . Compression fracture of T12 vertebra (HCC) Yes  . Fall from ladder, subsequent encounter   . Contusion of buttock, subsequent encounter   . Left elbow pain   . Acute right-sided low  back pain without sciatica     Plan:  Reviewed ED report, all imaging including xrays and CT neck.    We will request neurosurgery notes.  Fortunately he is improving.  He declines PT referral.  Discussed recommendations as below today, changes in medication as below today to wean down some off hydrocodone.  Patient Instructions   Recommendations  Use the Hydrocodone pain medication at bedtime and around lunch time if needed  Begin Ibuprofen 600mg , once in the morning, and once in afternoon around 5-6 pm if needed  Continue the stool softener Docusate twice daily as needed while on the pain medication  STOP Valium/Diazepam   Use gentle stretching daily  Avoid falls!!    Matthew Mckay was seen today for follow-up.  Diagnoses and all orders for this visit:  Compression fracture of T12 vertebra ([place])  Fall from ladder, subsequent encounter  Contusion of buttock, subsequent encounter  Left elbow pain  Acute right-sided low back pain without sciatica  Other orders -     HYDROcodone-acetaminophen (NORCO/VICODIN) 5-325 MG tablet; 1 tablet po at lunch time if needed and at bedtime daily -     ibuprofen (ADVIL,MOTRIN) 600 MG tablet; 1 tablet po BID in morning and late afternoon

## 2016-11-02 DIAGNOSIS — I10 Essential (primary) hypertension: Secondary | ICD-10-CM | POA: Diagnosis not present

## 2016-11-02 DIAGNOSIS — S32019D Unspecified fracture of first lumbar vertebra, subsequent encounter for fracture with routine healing: Secondary | ICD-10-CM | POA: Diagnosis not present

## 2017-01-25 DIAGNOSIS — M9901 Segmental and somatic dysfunction of cervical region: Secondary | ICD-10-CM | POA: Diagnosis not present

## 2017-01-25 DIAGNOSIS — M5033 Other cervical disc degeneration, cervicothoracic region: Secondary | ICD-10-CM | POA: Diagnosis not present

## 2017-01-25 DIAGNOSIS — M9902 Segmental and somatic dysfunction of thoracic region: Secondary | ICD-10-CM | POA: Diagnosis not present

## 2017-01-25 DIAGNOSIS — M50322 Other cervical disc degeneration at C5-C6 level: Secondary | ICD-10-CM | POA: Diagnosis not present

## 2017-01-26 DIAGNOSIS — M50322 Other cervical disc degeneration at C5-C6 level: Secondary | ICD-10-CM | POA: Diagnosis not present

## 2017-01-26 DIAGNOSIS — M5033 Other cervical disc degeneration, cervicothoracic region: Secondary | ICD-10-CM | POA: Diagnosis not present

## 2017-01-26 DIAGNOSIS — M9902 Segmental and somatic dysfunction of thoracic region: Secondary | ICD-10-CM | POA: Diagnosis not present

## 2017-01-26 DIAGNOSIS — M9901 Segmental and somatic dysfunction of cervical region: Secondary | ICD-10-CM | POA: Diagnosis not present

## 2017-02-04 ENCOUNTER — Other Ambulatory Visit (INDEPENDENT_AMBULATORY_CARE_PROVIDER_SITE_OTHER): Payer: Medicare Other

## 2017-02-04 DIAGNOSIS — Z23 Encounter for immunization: Secondary | ICD-10-CM | POA: Diagnosis not present

## 2017-02-18 ENCOUNTER — Encounter: Payer: Self-pay | Admitting: Medical

## 2017-02-18 ENCOUNTER — Ambulatory Visit (INDEPENDENT_AMBULATORY_CARE_PROVIDER_SITE_OTHER): Payer: Medicare Other | Admitting: Medical

## 2017-02-18 VITALS — BP 124/84 | HR 65 | Ht 68.0 in | Wt 181.8 lb

## 2017-02-18 DIAGNOSIS — I10 Essential (primary) hypertension: Secondary | ICD-10-CM

## 2017-02-18 DIAGNOSIS — K219 Gastro-esophageal reflux disease without esophagitis: Secondary | ICD-10-CM

## 2017-02-18 DIAGNOSIS — Z1211 Encounter for screening for malignant neoplasm of colon: Secondary | ICD-10-CM

## 2017-02-18 DIAGNOSIS — H6123 Impacted cerumen, bilateral: Secondary | ICD-10-CM | POA: Diagnosis not present

## 2017-02-18 DIAGNOSIS — E785 Hyperlipidemia, unspecified: Secondary | ICD-10-CM

## 2017-02-18 DIAGNOSIS — J3089 Other allergic rhinitis: Secondary | ICD-10-CM

## 2017-02-18 DIAGNOSIS — Z7189 Other specified counseling: Secondary | ICD-10-CM

## 2017-02-18 DIAGNOSIS — L989 Disorder of the skin and subcutaneous tissue, unspecified: Secondary | ICD-10-CM

## 2017-02-18 DIAGNOSIS — Z7185 Encounter for immunization safety counseling: Secondary | ICD-10-CM

## 2017-02-18 DIAGNOSIS — H1013 Acute atopic conjunctivitis, bilateral: Secondary | ICD-10-CM | POA: Diagnosis not present

## 2017-02-18 DIAGNOSIS — R0982 Postnasal drip: Secondary | ICD-10-CM | POA: Diagnosis not present

## 2017-02-18 DIAGNOSIS — M25522 Pain in left elbow: Secondary | ICD-10-CM | POA: Diagnosis not present

## 2017-02-18 LAB — LIPID PANEL
CHOL/HDL RATIO: 3.3 (calc) (ref <5.0)
Cholesterol: 127 mg/dL (ref <200)
HDL: 38 mg/dL — AB (ref >40)
LDL CHOLESTEROL (CALC): 65 mg/dL
NON-HDL CHOLESTEROL (CALC): 89 mg/dL (ref <130)
Triglycerides: 165 mg/dL — ABNORMAL HIGH (ref <150)

## 2017-02-18 NOTE — Progress Notes (Signed)
Subjective:    Matthew Mckay is a 76 y.o. male who presents for med check.  Primary Care Provider Tysinger, Camelia Eng, PA-C here for primary care  Current Health Care Team: Medical Team: Sees chiropractor on [facility] Dr. Clarene Essex, GI Dr. Carmelina Peal and Johnanna Schneiders, allergist Dr. Allyn Kenner, dermatology Not seeing Urology currently, had seen Urologist in [place] in the past, treatment for prostate cancer 1995. Crisoforo Oxford, PA-C here with Dr. Redmond School for primary care  HTN - compliant with medications without c/o.  Hyperlipidemia - compliant with statin but doesn't want to keep taking it.  Wants lab today  He had the fall off ladder and vertebral fracture a few months ago in June.   Still having left elbow and arm pain.  Wants xray.  Still having left upper back and neck pain.  Sees chiropractor. He is left handed  Active, exercising, eating healthy.    Has chronic post nasal drainage, clearing throat, congestion, runny nose.    Past Medical History:  Diagnosis Date  . Allergy   . Anxiety   . Chronic sinusitis   . Diverticulosis    sigmoid colon and increased vascularization due to prior radiation, per 2013 colonoscopy  . GERD (gastroesophageal reflux disease)   . Hemorrhoid   . Hypertension   . Prostate cancer [facility])    history of radiation therapy    Past Surgical History:  Procedure Laterality Date  . COLONOSCOPY  02/2012   sigmoid diverticulosis, polyps, Dr. Clarene Essex  . PROSTATECTOMY     1990s    Social History   Social History  . Marital status: Married    Spouse name: N/A  . Number of children: N/A  . Years of education: N/A   Occupational History  . Not on file.   Social History Main Topics  . Smoking status: Never Smoker  . Smokeless tobacco: Never Used  . Alcohol use No  . Drug use: No  . Sexual activity: Not on file   Other Topics Concern  . Not on file   Social History Narrative   Lives alone. Widowed in 2009.   Handles own ADLs.    [employer] engineer, contracting, always busy, yard work, garden work, works at [employer].    Goes to [employer].   6 brothers and 3 sisters.  Originally from [place], brought up in [place].  Father is [place], mother from [place]. Has family all over the place, [place], [place], [place], [place]    Family History  Problem Relation Age of Onset  . Cancer Brother        stomach  . Arthritis Brother   . Arthritis Brother   . Hypertension Brother   . Hypertension Brother   . Hypertension Mother   . Cancer Father        lung, smoked 2 ppd  . Cancer Paternal Uncle        cancer  . Cancer Paternal Uncle        stomach     Current Outpatient Prescriptions:  .  atorvastatin (LIPITOR) 10 MG tablet, Take 1 tablet (10 mg total) by mouth daily., Disp: 90 tablet, Rfl: 3 .  diazepam (VALIUM) 5 MG tablet, Take 0.5-1 tablets (2.5-5 mg total) by mouth every 8 (eight) hours as needed for muscle spasms., Disp: 10 tablet, Rfl: 0 .  famotidine (PEPCID) 40 MG tablet, Take 1 tablet (40 mg total) by mouth daily., Disp: 90 tablet, Rfl: 3 .  levocetirizine (XYZAL) 5 MG  tablet, Take 1 tablet (5 mg total) by mouth every evening., Disp: 30 tablet, Rfl: 11 .  lisinopril-hydrochlorothiazide (PRINZIDE,ZESTORETIC) 20-12.5 MG tablet, Take 1 tablet by mouth daily., Disp: 90 tablet, Rfl: 3 .  Olopatadine HCl (PAZEO) 0.7 % SOLN, Place 1 drop into both eyes 1 day or 1 dose., Disp: 1 Bottle, Rfl: 5 .  verapamil (CALAN-SR) 240 MG CR tablet, Take 1 tablet (240 mg total) by mouth at bedtime., Disp: 90 tablet, Rfl: 3 .  Azelastine HCl 0.15 % SOLN, 1-2 SPRAYS IN EACH NOSTRIL DAILY, Disp: 30 mL, Rfl: 11 .  clobetasol cream (TEMOVATE) 7.61 %, Apply 1 application topically 2 (two) times daily., Disp: 30 g, Rfl: 0 .  triamcinolone cream (KENALOG) 0.1 %, Apply 1 application topically 2 (two) times daily. (Patient not taking: Reported on 02/18/2017), Disp: 30 g, Rfl: 0  No Known Allergies  History reviewed: allergies, current medications, past  family history, past medical history, past social history, past surgical history and problem list   Objective:   BP 124/84   Pulse 65   Ht 5\' 8"  (1.727 m)   Wt 181 lb 12.8 oz (82.5 kg)   SpO2 95%   BMI 27.64 kg/m   Wt Readings from Last 3 Encounters:  02/18/17 181 lb 12.8 oz (82.5 kg)  10/15/16 182 lb (82.6 kg)  09/24/16 186 lb 6.4 oz (84.6 kg)   General appearance: alert, no distress, WD/WN, white male Tender over left lateral elbow epicondyle, tender over olecranon, otherwise nontender, no deformity of arm, arm ROM full, but he notes pain in elbow region with crossover test.    Moderate cerumen bilat ear canals, +post nasal drainage, mucoid discharge in both nares with turbinated edema, chronic watery eyes with some chronic redness of conjunctiva Skin: crusted roundish yellow brown lesions of right cheek, right dorsal hand, left lower lateral leg, possible AKs, Neck: supple, nontender, normal ROM, no lymphadenopathy, no thyromegaly, no masses,no bruits Heart: RRR, normal S1, S2, no murmurs Lungs: CTA bilaterally, no wheezes, rhonchi, or rales Abdomen: +bs, soft, non tender, non distended, no masses, no hepatomegaly, no splenomegaly Extremities: no edema, no cyanosis, no clubbing Pulses: 2+ symmetric, upper and lower extremities, normal cap refill Arms neurovascularly intact   Adult ECG Report  Indication: HTN  Rate: 66 bpm  Rhythm: normal sinus rhythm  QRS Axis: 5 degrees  PR Interval: 174ms  QRS Duration: 118ms  QTc: 44ms  Conduction Disturbances: incomplete RBBB  Other Abnormalities: none  Patient's cardiac risk factors are: advanced age (older than 14 for men, 29 for women), dyslipidemia, hypertension and male gender  EKG comparison: 2015   Narrative Interpretation: no acute changes  Assessment:   Encounter Diagnoses  Name Primary?  . Essential hypertension Yes  . Other allergic rhinitis   . Gastroesophageal reflux disease, esophagitis presence not specified     . Allergic conjunctivitis, bilateral   . Vaccine counseling   . Left elbow pain   . Post-nasal drainage   . Bilateral impacted cerumen   . Skin lesion   . Hyperlipidemia, unspecified hyperlipidemia type   . Screen for colon cancer      Plan:   HTN - EKG reviewed, c/t same plan  hyperlipidemia - labs today, c/t statin.  Discussed goals/benefits and risks of medication  Allergic rhinitis, GERD - c/t same medication, refer to ENT for chronic throat clearing and post nasal drainage  counseled on Td and shingles vaccine.  He will check insurance coverage  Skin lesions - advised he  f/u with Dr. Nevada Crane  Discussed findings.  Discussed risk/benefits of procedure and patient agrees to procedure. used warm water lavage to remove impacted cerumen from left ear canal but he got dizzy so we stopped the lavage.  Use basic hygiene as discussed.  Refer to ENT.   Refer to Cologard as he has 10 year or more life expectancy.    He is relatively healthy and family hx/o lots of people living into late 39-90s.   Cline was seen today for medicare physical.  Diagnoses and all orders for this visit:  Essential hypertension -     EKG 12-Lead -     Lipid panel  Other allergic rhinitis -     Ambulatory referral to ENT  Gastroesophageal reflux disease, esophagitis presence not specified -     Ambulatory referral to ENT  Allergic conjunctivitis, bilateral -     Ambulatory referral to ENT  Vaccine counseling  Left elbow pain  Post-nasal drainage -     Ambulatory referral to ENT  Bilateral impacted cerumen  Skin lesion  Hyperlipidemia, unspecified hyperlipidemia type  Screen for colon cancer

## 2017-02-18 NOTE — Patient Instructions (Addendum)
Recommendations:  I recommend you make a follow up appointment with Dr. Nevada Crane for dermatology follow up  I recommend we refer you to ear nose and throat specialist for chronic post nasal drainage and allergy symptoms.   Continue your current medications  I will refer you for Cologard test for colon cancer screen.  Except a kit in the mail.   Call your insurer to make sure they cover the Cologard test.  Call your insurance about coverage for tetanus diptheria vaccine  I recommend you have a shingles vaccine to help prevent shingles or herpes zoster outbreak.   Please call your insurer to inquire about coverage for the Shingrix vaccine given in 2 doses.   Some insurers cover this vaccine after age 22, some cover this after age 51.  If your insurer covers this, then call to schedule appointment to have this vaccine here.  Go for xray of the left elbow

## 2017-02-24 ENCOUNTER — Ambulatory Visit
Admission: RE | Admit: 2017-02-24 | Discharge: 2017-02-24 | Disposition: A | Discharge status: Home / Self Care | Payer: Medicare Other | Source: Ambulatory Visit | Attending: Medical | Admitting: Medical

## 2017-02-24 DIAGNOSIS — M25522 Pain in left elbow: Secondary | ICD-10-CM | POA: Diagnosis not present

## 2017-03-22 DIAGNOSIS — H02122 Mechanical ectropion of right lower eyelid: Secondary | ICD-10-CM | POA: Diagnosis not present

## 2017-03-22 DIAGNOSIS — H01012 Ulcerative blepharitis right lower eyelid: Secondary | ICD-10-CM | POA: Diagnosis not present

## 2017-04-16 DIAGNOSIS — H524 Presbyopia: Secondary | ICD-10-CM | POA: Diagnosis not present

## 2017-04-16 DIAGNOSIS — H01012 Ulcerative blepharitis right lower eyelid: Secondary | ICD-10-CM | POA: Diagnosis not present

## 2017-04-16 DIAGNOSIS — H2513 Age-related nuclear cataract, bilateral: Secondary | ICD-10-CM | POA: Diagnosis not present

## 2017-04-16 DIAGNOSIS — H01015 Ulcerative blepharitis left lower eyelid: Secondary | ICD-10-CM | POA: Diagnosis not present

## 2017-04-16 DIAGNOSIS — H02132 Senile ectropion of right lower eyelid: Secondary | ICD-10-CM | POA: Diagnosis not present

## 2017-04-20 DIAGNOSIS — R339 Retention of urine, unspecified: Secondary | ICD-10-CM

## 2017-04-20 HISTORY — DX: Retention of urine, unspecified: R33.9

## 2017-06-07 ENCOUNTER — Encounter: Payer: Self-pay | Admitting: Medical

## 2017-06-07 ENCOUNTER — Ambulatory Visit (INDEPENDENT_AMBULATORY_CARE_PROVIDER_SITE_OTHER): Payer: Medicare Other | Admitting: Medical

## 2017-06-07 VITALS — BP 118/74 | HR 74 | Temp 98.5°F | Wt 183.2 lb

## 2017-06-07 DIAGNOSIS — H9202 Otalgia, left ear: Secondary | ICD-10-CM

## 2017-06-07 DIAGNOSIS — J0100 Acute maxillary sinusitis, unspecified: Secondary | ICD-10-CM | POA: Diagnosis not present

## 2017-06-07 DIAGNOSIS — R0982 Postnasal drip: Secondary | ICD-10-CM | POA: Diagnosis not present

## 2017-06-07 MED ORDER — AMOXICILLIN 875 MG PO TABS: 875.0000 mg | ORAL_TABLET | Freq: Two times a day (BID) | ORAL | 0 refills | Status: DC

## 2017-06-07 NOTE — Progress Notes (Signed)
Subjective:  Matthew Mckay is a 77 y.o. male who presents for possible sinus infection.  Has bad head cold.  Has lots of mucous drainage down throat, sore throat, having sneezing.  couldn't sleep last few nights well due to sore throat and drainage.   Has left ear pressure and down left neck.  Has had some chills.  No fever.  No body aches.  No vomiting.   Patient is not a smoker.  No other aggravating or relieving factors.  No other c/o.  Past Medical History:  Diagnosis Date  . Allergy   . Anxiety   . Chronic sinusitis   . Diverticulosis    sigmoid colon and increased vascularization due to prior radiation, per 2013 colonoscopy  . GERD (gastroesophageal reflux disease)   . Hemorrhoid   . Hypertension   . Prostate cancer [facility])    history of radiation therapy   Current Outpatient Medications on File Prior to Visit  Medication Sig Dispense Refill  . atorvastatin (LIPITOR) 10 MG tablet Take 1 tablet (10 mg total) by mouth daily. 90 tablet 3  . Azelastine HCl 0.15 % SOLN 1-2 SPRAYS IN EACH NOSTRIL DAILY 30 mL 11  . clobetasol cream (TEMOVATE) 8.41 % Apply 1 application topically 2 (two) times daily. 30 g 0  . famotidine (PEPCID) 40 MG tablet Take 1 tablet (40 mg total) by mouth daily. 90 tablet 3  . levocetirizine (XYZAL) 5 MG tablet Take 1 tablet (5 mg total) by mouth every evening. 30 tablet 11  . lisinopril-hydrochlorothiazide (PRINZIDE,ZESTORETIC) 20-12.5 MG tablet Take 1 tablet by mouth daily. 90 tablet 3  . Olopatadine HCl (PAZEO) 0.7 % SOLN Place 1 drop into both eyes 1 day or 1 dose. 1 Bottle 5  . verapamil (CALAN-SR) 240 MG CR tablet Take 1 tablet (240 mg total) by mouth at bedtime. 90 tablet 3  . triamcinolone cream (KENALOG) 0.1 % Apply 1 application topically 2 (two) times daily. (Patient not taking: Reported on 02/18/2017) 30 g 0   No current facility-administered medications on file prior to visit.     ROS as in subjective   Objective: BP 118/74   Pulse 74   Temp  98.5 F (36.9 C)   Wt 183 lb 3.2 oz (83.1 kg)   SpO2 96%   BMI 27.86 kg/m   General appearance: Alert, WD/WN, no distress                             Skin: warm, no rash                           Head: no sinus tenderness,                            Eyes: conjunctiva normal, corneas clear, PERRLA                            Ears: pearly TMs, external ear canals normal                          Nose: septum midline, turbinates swollen, with erythema and clear discharge             Mouth/throat: MMM, tongue normal, mild pharyngeal erythema and post nasal drainage  Neck: supple, no adenopathy, no thyromegaly, non tender                      Lungs: CTA bilaterally, no wheezes, rales, or rhonchi       Assessment  Encounter Diagnoses  Name Primary?  . Acute non-recurrent maxillary sinusitis Yes  . Left ear pain   . Post-nasal drainage       Plan: Patient Instructions  Recommendations:  Drink plenty of water throughout the day  Use salt water gargles and warm fluids to clear mucous from throat  Begin Amoxicillin antibiotic for a week  You can use Mucinex DM for cough and congestion  You can continue honey/hot tea/lemon mixture  If not much improved by Friday, then let us know   Matthew Mckay was seen today for cold ,sore throat.  Diagnoses and all orders for this visit:  Acute non-recurrent maxillary sinusitis  Left ear pain  Post-nasal drainage  Other orders -     amoxicillin (AMOXIL) 875 MG tablet; Take 1 tablet (875 mg total) by mouth 2 (two) times daily.   Patient was advised to call or return if worse or not improving in the next few days.    Patient voiced understanding of diagnosis, recommendations, and treatment plan.  After visit summary given.

## 2017-06-07 NOTE — Patient Instructions (Signed)
Recommendations:  Drink plenty of water throughout the day  Use salt water gargles and warm fluids to clear mucous from throat  Begin Amoxicillin antibiotic for a week  You can use Mucinex DM for cough and congestion  You can continue honey/hot tea/lemon mixture  If not much improved by Friday, then let us know

## 2017-08-02 ENCOUNTER — Other Ambulatory Visit: Payer: Self-pay | Admitting: Medical

## 2017-08-02 DIAGNOSIS — J3089 Other allergic rhinitis: Secondary | ICD-10-CM

## 2017-08-02 DIAGNOSIS — H1013 Acute atopic conjunctivitis, bilateral: Secondary | ICD-10-CM

## 2017-08-13 DIAGNOSIS — Z08 Encounter for follow-up examination after completed treatment for malignant neoplasm: Secondary | ICD-10-CM | POA: Diagnosis not present

## 2017-08-13 DIAGNOSIS — L84 Corns and callosities: Secondary | ICD-10-CM | POA: Diagnosis not present

## 2017-08-13 DIAGNOSIS — L570 Actinic keratosis: Secondary | ICD-10-CM | POA: Diagnosis not present

## 2017-08-13 DIAGNOSIS — Z85828 Personal history of other malignant neoplasm of skin: Secondary | ICD-10-CM | POA: Diagnosis not present

## 2017-08-13 DIAGNOSIS — L820 Inflamed seborrheic keratosis: Secondary | ICD-10-CM | POA: Diagnosis not present

## 2017-08-13 DIAGNOSIS — X32XXXD Exposure to sunlight, subsequent encounter: Secondary | ICD-10-CM | POA: Diagnosis not present

## 2017-08-26 ENCOUNTER — Other Ambulatory Visit: Payer: Self-pay | Admitting: Medical

## 2017-09-12 ENCOUNTER — Other Ambulatory Visit: Payer: Self-pay | Admitting: Medical

## 2017-10-09 ENCOUNTER — Other Ambulatory Visit: Payer: Self-pay | Admitting: Medical

## 2017-10-27 ENCOUNTER — Other Ambulatory Visit: Payer: Self-pay | Admitting: Medical

## 2017-11-20 ENCOUNTER — Other Ambulatory Visit: Payer: Self-pay | Admitting: Medical

## 2017-12-14 ENCOUNTER — Other Ambulatory Visit: Payer: Self-pay | Admitting: Medical

## 2018-01-15 ENCOUNTER — Other Ambulatory Visit: Payer: Self-pay | Admitting: Medical

## 2018-01-17 ENCOUNTER — Other Ambulatory Visit: Payer: Self-pay

## 2018-01-17 ENCOUNTER — Other Ambulatory Visit: Payer: Self-pay | Admitting: Medical

## 2018-01-17 MED ORDER — FAMOTIDINE 40 MG PO TABS: 40.0000 mg | ORAL_TABLET | Freq: Every day | ORAL | 0 refills | Status: DC

## 2018-01-18 ENCOUNTER — Other Ambulatory Visit: Payer: Self-pay | Admitting: Medical

## 2018-01-27 ENCOUNTER — Other Ambulatory Visit (INDEPENDENT_AMBULATORY_CARE_PROVIDER_SITE_OTHER): Payer: Medicare Other

## 2018-01-27 DIAGNOSIS — Z23 Encounter for immunization: Secondary | ICD-10-CM

## 2018-01-28 ENCOUNTER — Other Ambulatory Visit: Payer: Self-pay

## 2018-01-28 MED ORDER — FAMOTIDINE 40 MG PO TABS: 40.0000 mg | ORAL_TABLET | Freq: Every day | ORAL | 0 refills | Status: DC

## 2018-01-28 NOTE — Telephone Encounter (Signed)
Pharmacy has faxed a refill request for the pending medication

## 2018-02-06 ENCOUNTER — Emergency Department (HOSPITAL_COMMUNITY)
Admission: EM | Admit: 2018-02-06 | Discharge: 2018-02-07 | Disposition: A | Discharge status: Home / Self Care | Payer: Medicare Other | Attending: Emergency Medicine | Admitting: Emergency Medicine

## 2018-02-06 ENCOUNTER — Other Ambulatory Visit: Payer: Self-pay

## 2018-02-06 ENCOUNTER — Encounter (HOSPITAL_COMMUNITY): Payer: Self-pay

## 2018-02-06 DIAGNOSIS — I10 Essential (primary) hypertension: Secondary | ICD-10-CM | POA: Diagnosis not present

## 2018-02-06 DIAGNOSIS — N35911 Unspecified urethral stricture, male, meatal: Secondary | ICD-10-CM | POA: Diagnosis not present

## 2018-02-06 DIAGNOSIS — R300 Dysuria: Secondary | ICD-10-CM | POA: Diagnosis not present

## 2018-02-06 DIAGNOSIS — R339 Retention of urine, unspecified: Secondary | ICD-10-CM | POA: Insufficient documentation

## 2018-02-06 DIAGNOSIS — Z79899 Other long term (current) drug therapy: Secondary | ICD-10-CM | POA: Insufficient documentation

## 2018-02-06 DIAGNOSIS — Z8546 Personal history of malignant neoplasm of prostate: Secondary | ICD-10-CM | POA: Insufficient documentation

## 2018-02-06 DIAGNOSIS — R338 Other retention of urine: Secondary | ICD-10-CM | POA: Diagnosis not present

## 2018-02-06 LAB — URINALYSIS, ROUTINE W REFLEX MICROSCOPIC
Bilirubin Urine: NEGATIVE
Glucose, UA: NEGATIVE mg/dL
Ketones, ur: NEGATIVE mg/dL
Leukocytes, UA: NEGATIVE
Nitrite: NEGATIVE
Protein, ur: NEGATIVE mg/dL
Specific Gravity, Urine: 1.013 (ref 1.005–1.030)
pH: 7 (ref 5.0–8.0)

## 2018-02-06 LAB — CBC
HCT: 46 % (ref 39.0–52.0)
Hemoglobin: 15.2 g/dL (ref 13.0–17.0)
MCH: 29.4 pg (ref 26.0–34.0)
MCHC: 33 g/dL (ref 30.0–36.0)
MCV: 89 fL (ref 80.0–100.0)
Platelets: 234 10*3/uL (ref 150–400)
RBC: 5.17 MIL/uL (ref 4.22–5.81)
RDW: 12.2 % (ref 11.5–15.5)
WBC: 10.9 10*3/uL — ABNORMAL HIGH (ref 4.0–10.5)
nRBC: 0 % (ref 0.0–0.2)

## 2018-02-06 LAB — I-STAT CHEM 8, ED
BUN: 15 mg/dL (ref 8–23)
Calcium, Ion: 1.17 mmol/L (ref 1.15–1.40)
Chloride: 101 mmol/L (ref 98–111)
Creatinine, Ser: 0.8 mg/dL (ref 0.61–1.24)
Glucose, Bld: 121 mg/dL — ABNORMAL HIGH (ref 70–99)
HCT: 45 % (ref 39.0–52.0)
Hemoglobin: 15.3 g/dL (ref 13.0–17.0)
Potassium: 3.8 mmol/L (ref 3.5–5.1)
Sodium: 140 mmol/L (ref 135–145)
TCO2: 28 mmol/L (ref 22–32)

## 2018-02-06 MED ORDER — FENTANYL CITRATE (PF) 100 MCG/2ML IJ SOLN
50.0000 ug | Freq: Once | INTRAMUSCULAR | Status: AC
  Administered 2018-02-06: 50 ug via INTRAVENOUS
  Filled 2018-02-06: qty 2

## 2018-02-06 MED ORDER — ONDANSETRON HCL 4 MG/2ML IJ SOLN
4.0000 mg | Freq: Once | INTRAMUSCULAR | Status: AC
  Administered 2018-02-06: 4 mg via INTRAVENOUS
  Filled 2018-02-06: qty 2

## 2018-02-06 MED ORDER — ONDANSETRON 4 MG PO TBDP
4.0000 mg | ORAL_TABLET | Freq: Once | ORAL | Status: AC
  Administered 2018-02-06: 4 mg via ORAL
  Filled 2018-02-06: qty 1

## 2018-02-06 NOTE — Discharge Instructions (Addendum)
Follow-up with [facility].

## 2018-02-06 NOTE — ED Notes (Signed)
Attempted to insert foley x 2 w/ Dr. Alvino Chapel w/o success.

## 2018-02-06 NOTE — Consult Note (Signed)
Reason for Consult: Locally Advanced Prostate Cancer, Urethral Stricture +Bladder Neck Contracture  Referring Physician: Davonna Belling MD  Matthew Mckay is an 77 y.o. male.   HPI:   1 - Locally Advanced Prostate Cancer - s/p open prostatectomy 1995 + adjuvant radiaiton in [place].   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  Stricture / Duchesne recurrence resulting in urinary retention 01/2018 ==> bedside balloon dilation to 45F.  PMH otherwise unremarkable. No ischemic CV disease / blood thinners. 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 His PCP is Dr. Redmond School.  Today " Matthew Mckay " is seen as emergent consult for urinary retention. Last void about 12 hours ago and severe pain with urge to void. Several applaudable attempts at catheter by ER staff unsuccessful. Cr <1.3. [name] blood thinners.   Past Medical History:  Diagnosis Date  . Allergy   . Anxiety   . Chronic sinusitis   . Diverticulosis    sigmoid colon and increased vascularization due to prior radiation, per 2013 colonoscopy  . GERD (gastroesophageal reflux disease)   . Hemorrhoid   . Hypertension   . Prostate cancer [facility])    history of radiation therapy    Past Surgical History:  Procedure Laterality Date  . COLONOSCOPY  02/2012   sigmoid diverticulosis, polyps, Dr. Clarene Essex  . PROSTATECTOMY     1990s    Family History  Problem Relation Age of Onset  . Cancer Brother        stomach  . Arthritis Brother   . Arthritis Brother   . Hypertension Brother   . Hypertension Brother   . Hypertension Mother   . Cancer Father        lung, smoked 2 ppd  . Cancer Paternal Uncle        cancer  . Cancer Paternal Uncle        stomach    Social History:  reports that he has never smoked. He has never used smokeless tobacco. He reports that he  does not drink alcohol or use drugs.  Allergies: No Known Allergies  Medications: I have reviewed the patient's current medications.  Results for orders placed or performed during the hospital encounter of 02/06/18 (from the past 48 hour(s))  Urinalysis, Routine w reflex microscopic- may I&O cath if menses     Status: Abnormal   Collection Time: 02/06/18  8:31 PM  Result Value Ref Range   Color, Urine YELLOW YELLOW   APPearance CLEAR CLEAR   Specific Gravity, Urine 1.013 1.005 - 1.030   pH 7.0 5.0 - 8.0   Glucose, UA NEGATIVE NEGATIVE mg/dL   Hgb urine dipstick SMALL (A) NEGATIVE   Bilirubin Urine NEGATIVE NEGATIVE   Ketones, ur NEGATIVE NEGATIVE mg/dL   Protein, ur NEGATIVE NEGATIVE mg/dL   Nitrite NEGATIVE NEGATIVE   Leukocytes, UA NEGATIVE NEGATIVE   RBC / HPF 0-5 0 - 5 RBC/hpf   WBC, UA 0-5 0 - 5 WBC/hpf   Bacteria, UA RARE (A) NONE SEEN   Squamous Epithelial / LPF 0-5 0 - 5   Mucus PRESENT     Comment: Performed at [facility], [address], [place], [place] [address]  CBC     Status: Abnormal   Collection Time: 02/06/18  9:49 PM  Result Value Ref Range   WBC  10.9 (H) 4.0 - 10.5 K/uL   RBC 5.17 4.22 - 5.81 MIL/uL   Hemoglobin 15.2 13.0 - 17.0 g/dL   HCT 46.0 39.0 - 52.0 %   MCV 89.0 80.0 - 100.0 fL   MCH 29.4 26.0 - 34.0 pg   MCHC 33.0 30.0 - 36.0 g/dL   RDW 12.2 11.5 - 15.5 %   Platelets 234 150 - 400 K/uL   nRBC 0.0 0.0 - 0.2 %    Comment: Performed at [facility], [place] [address] [place], [place] [address]  I-stat Chem 8, ED     Status: Abnormal   Collection Time: 02/06/18 10:01 PM  Result Value Ref Range   Sodium 140 135 - 145 mmol/L   Potassium 3.8 3.5 - 5.1 mmol/L   Chloride 101 98 - 111 mmol/L   BUN 15 8 - 23 mg/dL   Creatinine, Ser 0.80 0.61 - 1.24 mg/dL   Glucose, Bld 121 (H) 70 - 99 mg/dL   Calcium, Ion 1.17 1.15 - 1.40 mmol/L   TCO2 28 22 - 32 mmol/L   Hemoglobin 15.3 13.0 - 17.0 g/dL   HCT 45.0 39.0 - 52.0 %    No results  found.  Review of Systems  Constitutional: Negative.   HENT: Negative.   Eyes: Negative.   Respiratory: Negative.   Cardiovascular: Negative.   Gastrointestinal: Positive for nausea.  Genitourinary: Positive for frequency and urgency.  Musculoskeletal: Negative.   Skin: Negative.   Neurological: Negative.   Endo/Heme/Allergies: Negative.   Psychiatric/Behavioral: Negative.    Blood pressure (!) 183/100, pulse 66, temperature 98.4 F (36.9 C), temperature source Oral, resp. rate 18, SpO2 98 %. Physical Exam  Constitutional: He appears well-developed.  In visible discomfort  HENT:  Head: Normocephalic.  Eyes: Pupils are equal, round, and reactive to light.  Neck: Normal range of motion.  Cardiovascular: Normal rate.  Respiratory: Effort normal.  GI: Soft.  Genitourinary:  Genitourinary Comments: Scant blood at meatus, some dribbling urine. Infrapubic scar w/o hernias.   Musculoskeletal: Normal range of motion.  Neurological: He is alert.  Skin: Skin is warm.  Psychiatric: He has a normal mood and affect.    BEDSIDE CYSTOSCOPY / DILATION: Using aseptic technique, penis prepped with iodine, and 20cc visicous lidocaine injected per urethral. 72F flexibel cystoscopy used to perfrome cysto-urethroscopy. Dense stricture distal 1/3 pendulous urethra unable to pass scope further. 0.38 sensor wire easily advanced to likely bladder area. 85F balloon dilation catheter advanced across pendulous urethra, inflated 90 seconds and relaeased. Able to visualise to bladder neck where dense contracture noted, sensor wire verified across. 85F dilaiton balloon then advanced across contracure, inflated x 90 seconds. Then able to traverse to urinary bladder which is distended and trabeculated w/o papillary lesions. New 72F council catheter then placed over wire, 10cc H2O in balloon. Efflux 600cc non-foul urine with complete resolution of pain, now very relieved and appreciative.   Assessment/Plan:  1  - Locally Advanced Prostate Cancer - PSA stable 0.4 range x years, do rec continued annual PSA surveillance givne h/o advanced disease.   2 - Urethral Stricture +Bladder Neck Contracture - recurrecne managed by bedside dilation today. Reluctant to perform any further dilation at this point due to increased incontinence risk and very slow recurrence (4 years +).   rec trial of void in few days with Urol nurse, then MD visit in about 4-6 weeks with PSA, PVR.   Valary Manahan 02/06/2018, 10:38 PM

## 2018-02-06 NOTE — ED Provider Notes (Signed)
[facility] EMERGENCY DEPARTMENT Provider Note   CSN: [id number] Arrival date & time: 02/06/18  2017     History   Chief Complaint Chief Complaint  Patient presents with  . Urinary Retention    HPI Matthew Mckay is a 77 y.o. male.  HPI Patient presents with urinary retention.  Does have a prostate cancer history.  States he has had to be "Roto-Rootered" in the past also.  After church today he was unable to urinate.  States he has had just a few drops come out.  Lower abdominal pain.  No fevers.  [facility] past. Past Medical History:  Diagnosis Date  . Allergy   . Anxiety   . Chronic sinusitis   . Diverticulosis    sigmoid colon and increased vascularization due to prior radiation, per 2013 colonoscopy  . GERD (gastroesophageal reflux disease)   . Hemorrhoid   . Hypertension   . Prostate cancer [facility])    history of radiation therapy    Patient Active Problem List   Diagnosis Date Noted  . Post-nasal drainage 02/18/2017  . Bilateral impacted cerumen 02/18/2017  . Skin lesion 02/18/2017  . Hyperlipidemia 02/18/2017  . Left elbow pain 10/15/2016  . Acute right-sided low back pain without sciatica 10/15/2016  . Compression fracture of T12 vertebra ([place]) 09/24/2016  . Accidental fall from ladder 09/24/2016  . Contusion of buttock 09/24/2016  . High risk medication use 09/24/2016  . Medicare annual wellness visit, subsequent 07/30/2016  . Allergic conjunctivitis, bilateral 07/30/2016  . 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 Poison ivy dermatitis 07/30/2016  . 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 Screening for diabetes mellitus 07/30/2016  . GERD (gastroesophageal reflux disease) 08/08/2015  . Essential hypertension 02/12/2015  . Encounter for health maintenance examination in adult 02/12/2015  . Other allergic rhinitis 02/12/2015  . History of prostate cancer 02/12/2015  . Need for prophylactic vaccination against  Streptococcus pneumoniae (pneumococcus) 02/12/2015  . Vaccine counseling 02/12/2015    Past Surgical History:  Procedure Laterality Date  . COLONOSCOPY  02/2012   sigmoid diverticulosis, polyps, Dr. Clarene Essex  . PROSTATECTOMY     1990s        Home Medications    Prior to Admission medications   Medication Sig Start Date End Date Taking? Authorizing Provider  amoxicillin (AMOXIL) 875 MG tablet Take 1 tablet (875 mg total) by mouth 2 (two) times daily. 06/07/17   Tysinger, Camelia Eng, PA-C  atorvastatin (LIPITOR) 10 MG tablet TAKE 1 TABLET (10 MG TOTAL) BY MOUTH DAILY. 01/17/18   Tysinger, Camelia Eng, PA-C  Azelastine HCl 0.15 % SOLN 1-2 SPRAYS IN [facility] NOSTRIL DAILY 07/30/16   Tysinger, Camelia Eng, PA-C  clobetasol cream (TEMOVATE) 9.62 % Apply 1 application topically 2 (two) times daily. 07/30/16   Tysinger, Camelia Eng, PA-C  famotidine (PEPCID) 40 MG tablet Take 1 tablet (40 mg total) by mouth daily. 01/28/18   Tysinger, Camelia Eng, PA-C  levocetirizine (XYZAL) 5 MG tablet TAKE 1 TABLET (5 MG TOTAL) BY MOUTH EVERY EVENING. 08/02/17   Tysinger, Camelia Eng, PA-C  lisinopril-hydrochlorothiazide (PRINZIDE,ZESTORETIC) 20-12.5 MG tablet TAKE 1 TABLET BY MOUTH DAILY. 09/14/17   Tysinger, Camelia Eng, PA-C  Olopatadine HCl (PAZEO) 0.7 % SOLN Place 1 drop into both eyes 1 day or 1 dose. 08/08/15   Bobbitt, Sedalia Muta, MD  triamcinolone cream (KENALOG) 0.1 % Apply 1 application topically 2 (two) times daily. Patient not taking: Reported on 02/18/2017 09/02/15  Tysinger, Camelia Eng, PA-C  verapamil (CALAN-SR) 240 MG CR tablet TAKE 1 TABLET (240 MG TOTAL) BY MOUTH AT BEDTIME. 10/27/17   Tysinger, Camelia Eng, PA-C    Family History Family History  Problem Relation Age of Onset  . Cancer Brother        stomach  . Arthritis Brother   . Arthritis Brother   . Hypertension Brother   . Hypertension Brother   . Hypertension Mother   . Cancer Father        lung, smoked 2 ppd  . Cancer Paternal Uncle        cancer  . Cancer  Paternal Uncle        stomach    Social History Social History   Tobacco Use  . Smoking status: Never Smoker  . Smokeless tobacco: Never Used  Substance Use Topics  . Alcohol use: No  . Drug use: No     Allergies   Patient has no known allergies.   Review of Systems Review of Systems  Constitutional: Negative for appetite change.  Respiratory: Negative for shortness of breath.   Gastrointestinal: Positive for abdominal pain.  Genitourinary: Positive for decreased urine volume and difficulty urinating.  Musculoskeletal: Negative for back pain.  Neurological: Negative for weakness.  Hematological: Does not bruise/bleed easily.     Physical Exam Updated Vital Signs BP (!) 144/81   Pulse 66   Temp 98.4 F (36.9 C) (Oral)   Resp 18   SpO2 96%   Physical Exam  Constitutional: He appears well-developed.  HENT:  Head: Normocephalic.  Cardiovascular: Normal rate.  Pulmonary/Chest: Effort normal. He has no wheezes.  Abdominal:  Lower abdominal fullness with tenderness.  Genitourinary: Penis normal.  Neurological: He is alert.  Skin: Skin is warm. Capillary refill takes less than 2 seconds.     ED Treatments / Results  Labs (all labs ordered are listed, but only abnormal results are displayed) Labs Reviewed  URINALYSIS, ROUTINE W REFLEX MICROSCOPIC - Abnormal; Notable for the following components:      Result Value   Hgb urine dipstick SMALL (*)    Bacteria, UA RARE (*)    All other components within normal limits  CBC - Abnormal; Notable for the following components:   WBC 10.9 (*)    All other components within normal limits  I-STAT CHEM 8, ED - Abnormal; Notable for the following components:   Glucose, Bld 121 (*)    All other components within normal limits  URINE CULTURE    EKG None  Radiology No results found.  Procedures Procedures (including critical care time)  Medications Ordered in ED Medications  fentaNYL (SUBLIMAZE) injection 50  mcg (50 mcg Intravenous Given 02/06/18 2147)  ondansetron (ZOFRAN) injection 4 mg (4 mg Intravenous Given 02/06/18 2243)     Initial Impression / Assessment and Plan / ED Course  I have reviewed the triage vital signs and the nursing notes.  Pertinent labs & imaging results that were available during my care of the patient were reviewed by me and considered in my medical decision making (see chart for details).     Patient with urinary retention.  Unable to Pass catheter in the ER on our trying both nursing and myself.  Dr. Tresa Moore from urology came in and dilated urethra.  Foley catheter placed.  Discharged to follow-up with urology.  Final Clinical Impressions(s) / ED Diagnoses   Final diagnoses:  Urinary retention    ED Discharge Orders    None  Davonna Belling, MD 02/06/18 2328

## 2018-02-06 NOTE — ED Triage Notes (Signed)
Pt states that he has not voided since this morning except for little bits, hx of prostate cancer.

## 2018-02-06 NOTE — ED Notes (Signed)
Bladder scan: 373 mL

## 2018-02-08 LAB — URINE CULTURE

## 2018-02-24 ENCOUNTER — Encounter: Payer: Self-pay | Admitting: Medical

## 2018-03-14 DIAGNOSIS — N320 Bladder-neck obstruction: Secondary | ICD-10-CM | POA: Diagnosis not present

## 2018-04-18 ENCOUNTER — Other Ambulatory Visit: Payer: Self-pay | Admitting: Medical

## 2018-04-18 NOTE — Telephone Encounter (Signed)
Called and left message that pt is due for an appt.    Once scheduled we can refill for 30 days

## 2018-04-21 ENCOUNTER — Ambulatory Visit (INDEPENDENT_AMBULATORY_CARE_PROVIDER_SITE_OTHER): Payer: Medicare Other | Admitting: Family Medicine

## 2018-04-21 ENCOUNTER — Encounter: Payer: Self-pay | Admitting: Family Medicine

## 2018-04-21 VITALS — BP 140/80 | HR 74 | Temp 98.3°F | Resp 16 | Wt 188.8 lb

## 2018-04-21 DIAGNOSIS — J0140 Acute pansinusitis, unspecified: Secondary | ICD-10-CM | POA: Diagnosis not present

## 2018-04-21 MED ORDER — AMOXICILLIN 875 MG PO TABS: 875.0000 mg | ORAL_TABLET | Freq: Two times a day (BID) | ORAL | 0 refills | Status: DC

## 2018-04-21 NOTE — Progress Notes (Signed)
Chief Complaint  Patient presents with  . head cold    head cold for 4 weeks. sinus drainage, sinus pressure, eyes watery, chest congestion, cough.     Subjective:  Matthew Mckay is a 78 y.o. male who presents for a 4 week history of URI symptoms.  Nasal congestion and post nasal drainage. Mild sinus pressure. Occasional cough.   Has underlying allergies and is taking Xyzal.   Denies fever, chills, body aches, chest pain, palpitations, shortness of breath, abdominal pain, N/V/D, LE edema.   Does not smoke. No recent antibiotics.   Treatment to date: green tea, ginger.  Denies sick contacts.  No other aggravating or relieving factors.  No other c/o.  ROS as in subjective.   Objective: Vitals:   04/21/18 1347  BP: 140/80  Pulse: 74  Resp: 16  Temp: 98.3 F (36.8 C)  SpO2: 98%    General appearance: Alert, WD/WN, no distress, mildly ill appearing                             Skin: warm, no rash                           Head: no sinus tenderness                            Eyes: conjunctiva normal, corneas clear, PERRLA                            Ears: pearly TMs, external ear canals normal                          Nose: septum midline, turbinates swollen, with erythema and clear discharge             Mouth/throat: MMM, tongue normal, mild pharyngeal erythema                           Neck: supple, no adenopathy, no thyromegaly, nontender                          Heart: RRR, normal S1, S2, no murmurs                         Lungs: CTA bilaterally, no wheezes, rales, or rhonchi      Assessment: Acute non-recurrent pansinusitis - Plan: amoxicillin (AMOXIL) 875 MG tablet    Plan: Discussed diagnosis and treatment of acute sinusitis. Amoxil prescribed. Suggested symptomatic OTC remedies. Continue on Xyzal. Nasal saline spray for congestion.  Tylenol or Ibuprofen OTC for fever and malaise.  Call/return if worsening or not back to baseline after completing the antibiotic.

## 2018-04-21 NOTE — Patient Instructions (Signed)
Take the antibiotic as prescribed. Stay well hydrated. Let me know if you are not improving.

## 2018-04-24 ENCOUNTER — Other Ambulatory Visit: Payer: Self-pay | Admitting: Medical

## 2018-04-24 DIAGNOSIS — J3089 Other allergic rhinitis: Secondary | ICD-10-CM

## 2018-04-24 DIAGNOSIS — H1013 Acute atopic conjunctivitis, bilateral: Secondary | ICD-10-CM

## 2018-04-25 NOTE — Telephone Encounter (Signed)
Is this ok to refill?  

## 2018-04-28 ENCOUNTER — Other Ambulatory Visit: Payer: Self-pay | Admitting: Medical

## 2018-04-28 NOTE — Telephone Encounter (Signed)
Is this ok to refill?  

## 2018-04-28 NOTE — Telephone Encounter (Signed)
Left detailed message that pt is due for fasting appt for refills on med to call office back to schedule

## 2018-05-14 ENCOUNTER — Other Ambulatory Visit: Payer: Self-pay | Admitting: Medical

## 2018-05-16 ENCOUNTER — Other Ambulatory Visit: Payer: Medicare Other

## 2018-05-16 ENCOUNTER — Other Ambulatory Visit: Payer: Self-pay | Admitting: Medical

## 2018-05-16 DIAGNOSIS — E785 Hyperlipidemia, unspecified: Secondary | ICD-10-CM | POA: Diagnosis not present

## 2018-05-16 DIAGNOSIS — Z79899 Other long term (current) drug therapy: Secondary | ICD-10-CM

## 2018-05-16 DIAGNOSIS — I10 Essential (primary) hypertension: Secondary | ICD-10-CM | POA: Diagnosis not present

## 2018-05-16 DIAGNOSIS — R7301 Impaired fasting glucose: Secondary | ICD-10-CM | POA: Diagnosis not present

## 2018-05-17 LAB — CBC
HEMATOCRIT: 44.6 % (ref 37.5–51.0)
HEMOGLOBIN: 15 g/dL (ref 13.0–17.7)
MCH: 29.9 pg (ref 26.6–33.0)
MCHC: 33.6 g/dL (ref 31.5–35.7)
MCV: 89 fL (ref 79–97)
Platelets: 243 10*3/uL (ref 150–450)
RBC: 5.02 x10E6/uL (ref 4.14–5.80)
RDW: 12.7 % (ref 11.6–15.4)
WBC: 9.1 10*3/uL (ref 3.4–10.8)

## 2018-05-17 LAB — COMPREHENSIVE METABOLIC PANEL
ALBUMIN: 4.1 g/dL (ref 3.7–4.7)
ALT: 18 IU/L (ref 0–44)
AST: 19 IU/L (ref 0–40)
Albumin/Globulin Ratio: 1.4 (ref 1.2–2.2)
Alkaline Phosphatase: 51 IU/L (ref 39–117)
BILIRUBIN TOTAL: 0.3 mg/dL (ref 0.0–1.2)
BUN / CREAT RATIO: 17 (ref 10–24)
BUN: 16 mg/dL (ref 8–27)
CO2: 24 mmol/L (ref 20–29)
CREATININE: 0.93 mg/dL (ref 0.76–1.27)
Calcium: 9.5 mg/dL (ref 8.6–10.2)
Chloride: 99 mmol/L (ref 96–106)
GFR calc non Af Amer: 78 mL/min/{1.73_m2} (ref >59)
GFR, EST AFRICAN AMERICAN: 90 mL/min/{1.73_m2} (ref >59)
GLOBULIN, TOTAL: 3 g/dL (ref 1.5–4.5)
Glucose: 107 mg/dL — ABNORMAL HIGH (ref 65–99)
Potassium: 4.4 mmol/L (ref 3.5–5.2)
SODIUM: 138 mmol/L (ref 134–144)
TOTAL PROTEIN: 7.1 g/dL (ref 6.0–8.5)

## 2018-05-17 LAB — LIPID PANEL
Chol/HDL Ratio: 4.3 ratio (ref 0.0–5.0)
Cholesterol, Total: 164 mg/dL (ref 100–199)
HDL: 38 mg/dL — AB (ref >39)
LDL Calculated: 103 mg/dL — ABNORMAL HIGH (ref 0–99)
TRIGLYCERIDES: 115 mg/dL (ref 0–149)
VLDL Cholesterol Cal: 23 mg/dL (ref 5–40)

## 2018-05-17 LAB — HEMOGLOBIN A1C
ESTIMATED AVERAGE GLUCOSE: 123 mg/dL
Hgb A1c MFr Bld: 5.9 % — ABNORMAL HIGH (ref 4.8–5.6)

## 2018-05-18 ENCOUNTER — Telehealth: Payer: Self-pay | Admitting: Medical

## 2018-05-18 NOTE — Telephone Encounter (Signed)
Left message for pt to call. Needs a medicare well visit per shane.

## 2018-05-26 ENCOUNTER — Telehealth: Payer: Self-pay | Admitting: Medical

## 2018-05-26 ENCOUNTER — Encounter: Payer: Self-pay | Admitting: Medical

## 2018-05-26 ENCOUNTER — Ambulatory Visit (INDEPENDENT_AMBULATORY_CARE_PROVIDER_SITE_OTHER): Payer: Medicare Other | Admitting: Medical

## 2018-05-26 VITALS — BP 120/70 | HR 70 | Temp 97.9°F | Resp 16 | Ht 69.0 in | Wt 189.6 lb

## 2018-05-26 DIAGNOSIS — I10 Essential (primary) hypertension: Secondary | ICD-10-CM | POA: Diagnosis not present

## 2018-05-26 DIAGNOSIS — M170 Bilateral primary osteoarthritis of knee: Secondary | ICD-10-CM | POA: Diagnosis not present

## 2018-05-26 DIAGNOSIS — K5900 Constipation, unspecified: Secondary | ICD-10-CM

## 2018-05-26 DIAGNOSIS — K219 Gastro-esophageal reflux disease without esophagitis: Secondary | ICD-10-CM

## 2018-05-26 DIAGNOSIS — L989 Disorder of the skin and subcutaneous tissue, unspecified: Secondary | ICD-10-CM

## 2018-05-26 DIAGNOSIS — J3089 Other allergic rhinitis: Secondary | ICD-10-CM

## 2018-05-26 DIAGNOSIS — Z7185 Encounter for immunization safety counseling: Secondary | ICD-10-CM

## 2018-05-26 DIAGNOSIS — Z7189 Other specified counseling: Secondary | ICD-10-CM

## 2018-05-26 DIAGNOSIS — Z8546 Personal history of malignant neoplasm of prostate: Secondary | ICD-10-CM

## 2018-05-26 DIAGNOSIS — Z0000 Encounter for general adult medical examination without abnormal findings: Secondary | ICD-10-CM | POA: Diagnosis not present

## 2018-05-26 DIAGNOSIS — R251 Tremor, unspecified: Secondary | ICD-10-CM

## 2018-05-26 DIAGNOSIS — L84 Corns and callosities: Secondary | ICD-10-CM

## 2018-05-26 DIAGNOSIS — Z79899 Other long term (current) drug therapy: Secondary | ICD-10-CM

## 2018-05-26 DIAGNOSIS — S22080D Wedge compression fracture of T11-T12 vertebra, subsequent encounter for fracture with routine healing: Secondary | ICD-10-CM

## 2018-05-26 DIAGNOSIS — Z136 Encounter for screening for cardiovascular disorders: Secondary | ICD-10-CM | POA: Diagnosis not present

## 2018-05-26 DIAGNOSIS — E785 Hyperlipidemia, unspecified: Secondary | ICD-10-CM

## 2018-05-26 DIAGNOSIS — H1013 Acute atopic conjunctivitis, bilateral: Secondary | ICD-10-CM

## 2018-05-26 DIAGNOSIS — R339 Retention of urine, unspecified: Secondary | ICD-10-CM

## 2018-05-26 DIAGNOSIS — Z8781 Personal history of (healed) traumatic fracture: Secondary | ICD-10-CM

## 2018-05-26 LAB — POCT URINALYSIS DIP (PROADVANTAGE DEVICE)
BILIRUBIN UA: NEGATIVE
BILIRUBIN UA: NEGATIVE mg/dL
GLUCOSE UA: NEGATIVE mg/dL
Leukocytes, UA: NEGATIVE
Nitrite, UA: NEGATIVE
Protein Ur, POC: NEGATIVE mg/dL
SPECIFIC GRAVITY, URINE: 1.025
Urobilinogen, Ur: NEGATIVE
pH, UA: 6 (ref 5.0–8.0)

## 2018-05-26 MED ORDER — OMEPRAZOLE 40 MG PO CPDR: 40.0000 mg | DELAYED_RELEASE_CAPSULE | Freq: Every day | ORAL | 2 refills | Status: DC

## 2018-05-26 MED ORDER — FLUTICASONE PROPIONATE 50 MCG/ACT NA SUSP: 2.0000 | Freq: Every day | NASAL | 6 refills | Status: DC

## 2018-05-26 MED ORDER — POLYETHYLENE GLYCOL 3350 17 GM/SCOOP PO POWD: ORAL | 3 refills | Status: DC

## 2018-05-26 MED ORDER — POLYETHYLENE GLYCOL 3350 17 GM/SCOOP PO POWD: 17.0000 g | Freq: Two times a day (BID) | ORAL | 1 refills | Status: DC | PRN

## 2018-05-26 NOTE — Telephone Encounter (Signed)
See other messages.  Have him check insurance for coverage for Shingles and Tetanus boosters  Since he has never seen cardiology, it may be worth a baseline evaluation with cardiology now to make sure heart is strong.  [place] going [place] in [place], and he has never had an eval with cardiology.   This may be worth a consult just for reassurance

## 2018-05-26 NOTE — Patient Instructions (Addendum)
Recommendations: Constipation - begin Miralax powder, 1 cap full daily in a beverage such as water  Allergies/runny nose and congestion - stop Xyzal for now given the dry mouth, and begin Flonase nasal spray, 1 spray per nostril daily  We will refer you back to Dr. Allyn Kenner for the corns of the feet and general skin surveillance  Continue Pepcid for acid, but when worse, use Omeprazole oral tablet 45 minutes prior to breakfast for worse acid reflux.   Avoid acidic foods, spicy foods, large portions, peppers, and lots of citrus.  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  However, if you feel you have worse tremor, instability on your feet, memory changes, or less coordination in the past, let me know and I'll refer you to neurology for further consult  Lets plan a recheck in 1 month.    Fall Prevention in the Home, Adult Falls can cause injuries. They can happen to people of all ages. There are many things you can do to make your home safe and to help prevent falls. Ask for help when making these changes, if needed. What actions can I take to prevent falls? General Instructions  Use good lighting in all rooms. Replace any light bulbs that burn out.  Turn on the lights when you go into a dark area. Use night-lights.  Keep items that you use often in easy-to-reach places. Lower the shelves around your home if necessary.  Set up your furniture so you have a clear path. Avoid moving your furniture around.  Do not have throw rugs and other things on the floor that can make you trip.  Avoid walking on wet floors.  If any of your floors are uneven, fix them.  Add color or contrast paint or tape to clearly mark and help you see: ? Any grab bars or handrails. ? First and last steps of stairways. ? Where the edge of each step is.  If you use a stepladder: ? Make sure that it is fully opened.  Do not climb a closed stepladder. ? Make sure that both sides of the stepladder are locked into place. ? Ask someone to hold the stepladder for you while you use it.  If there are any pets around you, be aware of where they are. What can I do in the bathroom?      Keep the floor dry. Clean up any water that spills onto the floor as soon as it happens.  Remove soap buildup in the tub or shower regularly.  Use non-skid mats or decals on the floor of the tub or shower.  Attach bath mats securely with double-sided, non-slip rug tape.  If you need to sit down in the shower, use a plastic, non-slip stool.  Install grab bars by the toilet and in the tub and shower. Do not use towel bars as grab bars. What can I do in the bedroom?  Make sure that you have a light by your bed that is easy to reach.  Do not use any sheets or blankets that are too big for your bed. They should not hang down onto the floor.  Have a firm chair that has side arms. You can use this for support while you get dressed. What can I do in the kitchen?  Clean up any spills right away.  If you need to reach something above  you, use a strong step stool that has a grab bar.  Keep electrical cords out of the way.  Do not use floor polish or wax that makes floors slippery. If you must use wax, use non-skid floor wax. What can I do with my stairs?  Do not leave any items on the stairs.  Make sure that you have a light switch at the top of the stairs and the bottom of the stairs. If you do not have them, ask someone to add them for you.  Make sure that there are handrails on both sides of the stairs, and use them. Fix handrails that are broken or loose. Make sure that handrails are as long as the stairways.  Install non-slip stair treads on all stairs in your home.  Avoid having throw rugs at the top or bottom of the stairs. If you do have throw rugs, attach them to the floor with carpet tape.  Choose a carpet  that does not hide the edge of the steps on the stairway.  Check any carpeting to make sure that it is firmly attached to the stairs. Fix any carpet that is loose or worn. What can I do on the outside of my home?  Use bright outdoor lighting.  Regularly fix the edges of walkways and driveways and fix any cracks.  Remove anything that might make you trip as you walk through a door, such as a raised step or threshold.  Trim any bushes or trees on the path to your home.  Regularly check to see if handrails are loose or broken. Make sure that both sides of any steps have handrails.  Install guardrails along the edges of any raised decks and porches.  Clear walking paths of anything that might make someone trip, such as tools or rocks.  Have any leaves, snow, or ice cleared regularly.  Use sand or salt on walking paths during winter.  Clean up any spills in your garage right away. This includes grease or oil spills. What other actions can I take?  Wear shoes that: ? Have a low heel. Do not wear high heels. ? Have rubber bottoms. ? Are comfortable and fit you well. ? Are closed at the toe. Do not wear open-toe sandals.  Use tools that help you move around (mobility aids) if they are needed. These include: ? Canes. ? Walkers. ? Scooters. ? Crutches.  Review your medicines with your doctor. Some medicines can make you feel dizzy. This can increase your chance of falling. Ask your doctor what other things you can do to help prevent falls. Where to find more information  Centers for Disease Control and Prevention, [employer]: https://garcia.biz/  [employer] on Aging: BrainJudge.co.uk Contact a doctor if:  You are afraid of falling at home.  You feel weak, drowsy, or dizzy at home.  You fall at home. Summary  There are many simple things that you can do to make your home safe and to help prevent falls.  Ways to make your home safe include removing tripping  hazards and installing grab bars in the bathroom.  Ask for help when making these changes in your home. This information is not intended to replace advice given to you by your health care provider. Make sure you discuss any questions you have with your health care provider. Document Released: 01/31/2009 Document Revised: 11/19/2016 Document Reviewed: 11/19/2016 Elsevier Interactive Patient Education  2019 [place] Directive  Advance directives are  legal documents that let you make choices ahead of time about your health care and medical treatment in case you become unable to communicate for yourself. Advance directives are a way for you to communicate your wishes to family, friends, and health care providers. This can help convey your decisions about end-of-life care if you become unable to communicate. Discussing and writing advance directives should happen over time rather than all at once. Advance directives can be changed depending on your situation and what you want, even after you have signed the advance directives. If you do not have an advance directive, some states assign family decision makers to act on your behalf based on how closely you are related to them. Each state has its own laws regarding advance directives. You may want to check with your health care provider, attorney, or state representative about the laws in your state. There are different types of advance directives, such as:  Medical power of attorney.  Living will.  Do not resuscitate (DNR) or do not attempt resuscitation (DNAR) order. Health care proxy and medical power of attorney A health care proxy, also called a health care agent, is a person who is appointed to make medical decisions for you in cases in which you are unable to make the decisions yourself. Generally, people choose someone they know well and trust to represent their preferences. Make sure to ask this person for an agreement to  act as your proxy. A proxy may have to exercise judgment in the event of a medical decision for which your wishes are not known. A medical power of attorney is a legal document that names your health care proxy. Depending on the laws in your state, after the document is written, it may also need to be:  Signed.  Notarized.  Dated.  Copied.  Witnessed.  Incorporated into your medical record. You may also want to appoint someone to manage your financial affairs in a situation in which you are unable to do so. This is called a durable power of attorney for finances. It is a separate legal document from the durable power of attorney for health care. You may choose the same person or someone different from your health care proxy to act as your agent in financial matters. If you do not appoint a proxy, or if there is a concern that the proxy is not acting in your best interests, a court-appointed guardian may be designated to act on your behalf. Living will A living will is a set of instructions documenting your wishes about medical care when you cannot express them yourself. Health care providers should keep a copy of your living will in your medical record. You may want to give a copy to family members or friends. To alert caregivers in case of an emergency, you can place a card in your wallet to let them know that you have a living will and where they can find it. A living will is used if you become:  Terminally ill.  Incapacitated.  Unable to communicate or make decisions. Items to consider in your living will include:  The use or non-use of life-sustaining equipment, such as dialysis machines and breathing machines (ventilators).  A DNR or DNAR order, which is the instruction not to use cardiopulmonary resuscitation (CPR) if breathing or heartbeat stops.  The use or non-use of tube feeding.  Withholding of food and fluids.  Comfort (palliative) care when the goal becomes comfort  rather than a cure.  Organ  and tissue donation. A living will does not give instructions for distributing your money and property if you should pass away. It is recommended that you seek the advice of a lawyer when writing a will. Decisions about taxes, beneficiaries, and asset distribution will be legally binding. This process can relieve your family and friends of any concerns surrounding disputes or questions that may come up about the distribution of your assets. DNR or DNAR A DNR or DNAR order is a request not to have CPR in the event that your heart stops beating or you stop breathing. If a DNR or DNAR order has not been made and shared, a health care provider will try to help any patient whose heart has stopped or who has stopped breathing. If you plan to have surgery, talk with your health care provider about how your DNR or DNAR order will be followed if problems occur. Summary  Advance directives are the legal documents that allow you to make choices ahead of time about your health care and medical treatment in case you become unable to communicate for yourself.  The process of discussing and writing advance directives should happen over time. You can change the advance directives, even after you have signed them.  Advance directives include DNR or DNAR orders, living wills, and designating an agent as your medical power of attorney. This information is not intended to replace advice given to you by your health care provider. Make sure you discuss any questions you have with your health care provider. Document Released: 07/14/2007 Document Revised: 02/24/2016 Document Reviewed: 02/24/2016 Elsevier Interactive Patient Education  2019 [place] 65 Years and Older, Male Preventive care refers to lifestyle choices and visits with your health care provider that can promote health and wellness. What does preventive care include?   A yearly physical exam. This is  also called an annual well check.  Dental exams once or twice a year.  Routine eye exams. Ask your health care provider how often you should have your eyes checked.  Personal lifestyle choices, including: ? Daily care of your teeth and gums. ? Regular physical activity. ? Eating a healthy diet. ? Avoiding tobacco and drug use. ? Limiting alcohol use. ? Practicing safe sex. ? Taking low doses of aspirin every day. ? Taking vitamin and mineral supplements as recommended by your health care provider. What happens during an annual well check? The services and screenings done by your health care provider during your annual well check will depend on your age, overall health, lifestyle risk factors, and family history of disease. Counseling Your health care provider may ask you questions about your:  Alcohol use.  Tobacco use.  Drug use.  Emotional well-being.  Home and relationship well-being.  Sexual activity.  Eating habits.  History of falls.  Memory and ability to understand (cognition).  Work and work Statistician. Screening You may have the following tests or measurements:  Height, weight, and BMI.  Blood pressure.  Lipid and cholesterol levels. These may be checked every 5 years, or more frequently if you are over 14 years old.  Skin check.  Lung cancer screening. You may have this screening every year starting at age 12 if you have a 30-pack-year history of smoking and currently smoke or have quit within the past 15 years.  Colorectal cancer screening. All adults should have this screening starting at age 52 and continuing until age 40. You will have tests every 1-10 years,  depending on your results and the type of screening test. People at increased risk should start screening at an earlier age. Screening tests may include: ? Guaiac-based fecal occult blood testing. ? Fecal immunochemical test (FIT). ? Stool DNA test. ? Virtual colonoscopy. ? Sigmoidoscopy.  During this test, a flexible tube with a tiny camera (sigmoidoscope) is used to examine your rectum and lower colon. The sigmoidoscope is inserted through your anus into your rectum and lower colon. ? Colonoscopy. During this test, a long, thin, flexible tube with a tiny camera (colonoscope) is used to examine your entire colon and rectum.  Prostate cancer screening. Recommendations will vary depending on your family history and other risks.  Hepatitis C blood test.  Hepatitis B blood test.  Sexually transmitted disease (STD) testing.  Diabetes screening. This is done by checking your blood sugar (glucose) after you have not eaten for a while (fasting). You may have this done every 1-3 years.  Abdominal aortic aneurysm (AAA) screening. You may need this if you are a current or former smoker.  Osteoporosis. You may be screened starting at age 68 if you are at high risk. Talk with your health care provider about your test results, treatment options, and if necessary, the need for more tests. Vaccines Your health care provider may recommend certain vaccines, such as:  Influenza vaccine. This is recommended every year.  Tetanus, diphtheria, and acellular pertussis (Tdap, Td) vaccine. You may need a Td booster every 10 years.  Varicella vaccine. You may need this if you have not been vaccinated.  Zoster vaccine. You may need this after age 59.  Measles, mumps, and rubella (MMR) vaccine. You may need at least one dose of MMR if you were born in 1957 or later. You may also need a second dose.  Pneumococcal 13-valent conjugate (PCV13) vaccine. One dose is recommended after age 57.  Pneumococcal polysaccharide (PPSV23) vaccine. One dose is recommended after age 45.  Meningococcal vaccine. You may need this if you have certain conditions.  Hepatitis A vaccine. You may need this if you have certain conditions or if you travel or work in places where you may be exposed to hepatitis  A.  Hepatitis B vaccine. You may need this if you have certain conditions or if you travel or work in places where you may be exposed to hepatitis B.  Haemophilus influenzae type b (Hib) vaccine. You may need this if you have certain risk factors. Talk to your health care provider about which screenings and vaccines you need and how often you need them. This information is not intended to replace advice given to you by your health care provider. Make sure you discuss any questions you have with your health care provider. Document Released: 05/03/2015 Document Revised: 05/27/2017 Document Reviewed: 02/05/2015 Elsevier Interactive Patient Education  2019 [employer].

## 2018-05-26 NOTE — Telephone Encounter (Signed)
See other message below.   Also get him back for recheck in 1 month.   This is for recheck on things listed in the after visit summary, constipation, allergies, GERD, etc.

## 2018-05-26 NOTE — Progress Notes (Addendum)
Subjective:    Matthew Mckay is a 78 y.o. male who presents for Preventative Services visit and chronic medical problems/med check visit.    Primary Care Provider Tysinger, Camelia Eng, PA-C here for primary care  Current Health Care Team: Dentist, Dr. Lafonda Mosses, Dr. Lucia Gaskins chiropractor on [facility] Dr. Clarene Essex, GI Dr. Carmelina Peal and Johnanna Schneiders, allergist Dr. Allyn Kenner, dermatology Dr. Tresa Moore, Urology currently, had seen Urologist in [place] in the past, treatment for prostate [place].   Medical Services you may have received from other than [facility] providers in the past year (date may be approximate) Urology  Exercise Current exercise habits: walking    Nutrition/Diet Current diet: normal   Depression Screen Depression screen PHQ 2/9 05/26/2018  Decreased Interest 0  Down, Depressed, Hopeless 0  PHQ - 2 Score 0    Activities of Daily Living Screen/Functional Status Survey Is the patient deaf or have difficulty hearing?: No Does the patient have difficulty seeing, even when wearing glasses/contacts?: No Does the patient have difficulty concentrating, remembering, or making decisions?: No Does the patient have difficulty walking or climbing stairs?: No Does the patient have difficulty dressing or bathing?: Yes Does the patient have difficulty doing errands alone such as visiting a doctor's office or shopping?: No  Can patient draw a clock face showing 3:15 oclock, yes  Fall Risk Screen Fall Risk  05/26/2018 02/18/2017 02/12/2015 12/31/2014  Falls in the past year? 0 Yes No No  Number falls in past yr: - 1 - -    Gait Assessment: Normal gait observed yes  Advanced directives Does patient have a [place]? No  Does patient have a Living Will? No   Past Medical History:  Diagnosis Date  . Allergy   . Anxiety   . Chronic sinusitis   . Diverticulosis    sigmoid colon and increased vascularization due to prior radiation, per 2013 colonoscopy  .  GERD (gastroesophageal reflux disease)   . Hemorrhoid   . Hyperlipidemia   . Hypertension   . Prostate cancer [facility])    history of radiation therapy  . Urinary retention 2019    Past Surgical History:  Procedure Laterality Date  . COLONOSCOPY  02/2012   sigmoid diverticulosis, polyps, Dr. Clarene Essex  . PROSTATECTOMY     1990s    Social History   Socioeconomic History  . Marital status: Married    Spouse name: Not on file  . Number of children: Not on file  . Years of education: Not on file  . Highest education level: Not on file  Occupational History  . Not on file  Social Needs  . Financial resource strain: Not on file  . Food insecurity:    Worry: Not on file    Inability: Not on file  . Transportation needs:    Medical: Not on file    Non-medical: Not on file  Tobacco Use  . Smoking status: Never Smoker  . Smokeless tobacco: Never Used  Substance and Sexual Activity  . Alcohol use: No  . Drug use: No  . Sexual activity: Not on file  Lifestyle  . Physical activity:    Days per week: Not on file    Minutes per session: Not on file  . Stress: Not on file  Relationships  . Social connections:    Talks on phone: Not on file    Gets together: Not on file    Attends religious service: Not on file  Active member of club or organization: Not on file    Attends meetings of clubs or organizations: Not on file    Relationship status: Not on file  . Intimate partner violence:    Fear of current or ex partner: Not on file    Emotionally abused: Not on file    Physically abused: Not on file    Forced sexual activity: Not on file  Other Topics Concern  . Not on file  Social History Narrative   Lives alone. Widowed in 2009.   Handles own ADLs.   [employer] engineer, contracting, always busy, yard work, garden work, works at [employer].    Goes to [place].   6 brothers and 3 sisters.  Originally from [place], brought up in [place].  Father is [place], mother from [place]. Has  family all over the place, [place], [place], [place], [place]    Family History  Problem Relation Age of Onset  . Cancer Brother        stomach  . Arthritis Brother   . Arthritis Brother   . Hypertension Brother   . Hypertension Brother   . Hypertension Mother   . Cancer Father        lung, smoked 2 ppd  . Cancer Paternal Uncle        cancer  . Cancer Paternal Uncle        stomach     Current Outpatient Medications:  .  atorvastatin (LIPITOR) 10 MG tablet, TAKE 1 TABLET (10 MG TOTAL) BY MOUTH DAILY., Disp: 90 tablet, Rfl: 0 .  famotidine (PEPCID) 40 MG tablet, TAKE 1 TABLET BY MOUTH EVERY DAY, Disp: 90 tablet, Rfl: 0 .  lisinopril-hydrochlorothiazide (PRINZIDE,ZESTORETIC) 20-12.5 MG tablet, TAKE 1 TABLET BY MOUTH DAILY., Disp: 90 tablet, Rfl: 3 .  Olopatadine HCl (PAZEO) 0.7 % SOLN, Place 1 drop into both eyes 1 day or 1 dose., Disp: 1 Bottle, Rfl: 5 .  verapamil (CALAN-SR) 240 MG CR tablet, TAKE 1 TABLET (240 MG TOTAL) BY MOUTH AT BEDTIME., Disp: 90 tablet, Rfl: 3 .  fluticasone (FLONASE) 50 MCG/ACT nasal spray, Place 2 sprays into both nostrils daily., Disp: 16 g, Rfl: 6 .  omeprazole (PRILOSEC) 40 MG capsule, Take 1 capsule (40 mg total) by mouth daily., Disp: 30 capsule, Rfl: 2 .  polyethylene glycol powder (GLYCOLAX/MIRALAX) powder, 1 capfull daily in beverage, Disp: 3350 g, Rfl: 3  No Known Allergies  History reviewed: allergies, current medications, past family history, past medical history, past social history, past surgical history and problem list  Chronic issues discussed: Compliant with medications  Acute issues discussed: Constipation - bloating, gassy, sometimes BMs every 2 days, but when he has BM its soft, formed, but large, no blood.  Sometimes has mucous in BM  Allergies - chronic, lately not improved on Xyzal.     Having dry mouth, thinks its due to medications   Objective:      Biometrics BP 120/70   Pulse 70   Temp 97.9 F (36.6 C) (Oral)   Resp  16   Ht 5\' 9"  (1.753 m)   Wt 189 lb 9.6 oz (86 kg)   SpO2 98%   BMI 28.00 kg/m   Cognitive Testing  Alert? yes  Normal Appearance?yes   Oriented to person? Yes   Place? Yes     Time? Yes   Recall of three objects?  Yes -1  Can perform simple calculations? No   Displays appropriate judgment?yes   Can read the  correct time from a watch face?yes   General appearance: alert, no distress, WD/WN, white male  Gen: wd, wn, nad, pleasant Skin: right lateral foot distally over distal 5th metatarsal with thickened raised round lesion c/w corn, similar smaller raised thickened lesion later to left foot 5th distal lateral metatarsal also c/w corn, several raised relatively flat crusted yellowish lesions of right cheek, right forehead, othewr scattered macules HEENT: normocephalic, sclerae anicteric, TMs pearly, nares patent, no discharge, mild erythema, pharynx normal Oral cavity: MMM, no lesions Neck: supple, no lymphadenopathy, no thyromegaly, no masses, no bruits Heart: RRR, normal S1, S2, no murmurs Lungs: CTA bilaterally, no wheezes, rhonchi, or rales Abdomen: +bs, soft, non tender, non distended, no masses, no hepatomegaly, no splenomegaly Musculoskeletal: nontender, no swelling, no obvious deformity Extremities: no edema, no cyanosis, no clubbing Pulses: 2+ symmetric, upper and lower extremities, normal cap refill Neurological: alert, oriented x 3, CN2-12 intact, strength normal upper extremities and lower extremities, sensation normal throughout, DTRs 2+ throughout, no cerebellar signs, gait normal Psychiatric: normal affect, behavior normal, pleasant  Declined GU/rectal   EKG Normal sinus rhythm, 75 bpm, PR interval 148 ms, QRS duration 100 ms, QTC 4 4 4  ms, 18 degree axis, incomplete right bundle branch block, U waves present, unchanged from 2018 EKG   Assessment:   Encounter Diagnoses  Name Primary?  . Routine general medical examination at a health care facility Yes  .  Essential hypertension   . Gastroesophageal reflux disease, esophagitis presence not specified   . Other allergic rhinitis   . Osteoarthritis of both knees, unspecified osteoarthritis type   . History of compression fracture of spine   . Compression fracture of T12 vertebra with routine healing, subsequent encounter   . Skin lesion   . Allergic conjunctivitis, bilateral   . High risk medication use   . History of prostate cancer   . Hyperlipidemia, unspecified hyperlipidemia type   . Medicare annual wellness visit, subsequent   . Vaccine counseling   . Screening for heart disease   . Urinary retention   . Corn of foot   . Tremor   . Constipation, unspecified constipation type      Plan:   A preventative services visit was completed today.  During the course of the visit today, we discussed and counseled about appropriate screening and preventive services.  A health risk assessment was established today that included a review of current medications, allergies, social history, family history, medical and preventative health history, biometrics, and preventative screenings to identify potential safety concerns or impairments.  A personalized plan was printed today for your records and use.   Personalized health advice and education was given today to reduce health risks and promote self management and wellness.  Information regarding end of life planning was discussed today.  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   Add PPI, avoid  food triggers  hyperlipidemia - c/t current medication  Urinary retention - resolved with catheter, and been doing fine since last year's ED visit for this  Consider baseline cardiology eval   Recommendations:  I recommend a yearly ophthalmology/optometry visit for glaucoma screening and eye checkup  I recommended a yearly dental visit for hygiene and checkup  Advanced directives - discussed nature and purpose of Advanced Directives, encouraged them to complete them if they have not done so and/or encouraged them to get [place] a copy if they have done this already.  Referrals today: Dermatology Bone density test   Immunizations: Is up to date on pneumococcal, influenza.   Declines Td and shingles due to cost.  Tylee was seen today for awv.  Diagnoses and all orders for this visit:  Routine general medical examination at a health care facility -     POCT Urinalysis DIP (Proadvantage Device) -     EKG 12-Lead  Essential hypertension -     EKG 12-Lead  Gastroesophageal reflux disease, esophagitis presence not specified  Other allergic rhinitis  Osteoarthritis of both knees, unspecified osteoarthritis type  History of compression fracture of spine -     DG Bone Density; Future  Compression fracture of T12 vertebra with routine healing, subsequent encounter -     DG Bone Density; Future  Skin lesion -     Ambulatory referral to Dermatology  Allergic conjunctivitis, bilateral  High risk medication use  History of prostate cancer  Hyperlipidemia, unspecified hyperlipidemia type  Medicare annual wellness visit, subsequent  Vaccine counseling  Screening for heart disease -     EKG 12-Lead  Urinary retention  Corn of foot -     Ambulatory referral to Dermatology  Tremor  Constipation, unspecified constipation type  Other orders -     fluticasone (FLONASE) 50 MCG/ACT nasal spray; Place 2 sprays into both nostrils daily. -     Discontinue: polyethylene  glycol powder (GLYCOLAX/MIRALAX) powder; Take 17 g by mouth 2 (two) times daily as needed. -     polyethylene glycol powder (GLYCOLAX/MIRALAX) powder; 1 capfull daily in beverage -     omeprazole (PRILOSEC) 40 MG capsule; Take 1 capsule (40 mg total) by mouth daily.     Medicare Attestation A preventative services visit was completed today.  During the course of the visit the patient was educated and counseled about appropriate screening and preventive services.  A health risk assessment was established with the patient that included a review of current medications, allergies, social history, family history, medical and preventative health history, biometrics, and preventative screenings to identify potential safety concerns or impairments.  A personalized plan was printed today for the patient's records and use.   Personalized health advice and education was given today to reduce health risks and promote self management and wellness.  Information regarding end of life planning was discussed today.  Dorothea Ogle, PA-C   05/26/2018

## 2018-05-26 NOTE — Telephone Encounter (Signed)
Please set him up for bone density test and referral back to Dr. Allyn Kenner dermatology

## 2018-05-30 ENCOUNTER — Telehealth: Payer: Self-pay | Admitting: Medical

## 2018-05-30 NOTE — Telephone Encounter (Signed)
Tried to call patient and no voicemail and no answer.

## 2018-05-30 NOTE — Telephone Encounter (Signed)
Looking back over his chart, he is due for f/u with Dr. Watt Climes.   Last colonoscopy was 2013, and he was advised f/u 5 years after that which means past due.

## 2018-05-31 NOTE — Telephone Encounter (Signed)
Letter sent to patient.

## 2018-05-31 NOTE — Telephone Encounter (Signed)
Left message on voicemail for patient to call back. 

## 2018-07-01 DIAGNOSIS — L570 Actinic keratosis: Secondary | ICD-10-CM | POA: Diagnosis not present

## 2018-07-01 DIAGNOSIS — X32XXXD Exposure to sunlight, subsequent encounter: Secondary | ICD-10-CM | POA: Diagnosis not present

## 2018-07-01 DIAGNOSIS — L821 Other seborrheic keratosis: Secondary | ICD-10-CM | POA: Diagnosis not present

## 2018-07-19 ENCOUNTER — Other Ambulatory Visit: Payer: Self-pay | Admitting: Medical

## 2018-07-19 DIAGNOSIS — J3089 Other allergic rhinitis: Secondary | ICD-10-CM

## 2018-07-19 DIAGNOSIS — H1013 Acute atopic conjunctivitis, bilateral: Secondary | ICD-10-CM

## 2018-07-19 NOTE — Telephone Encounter (Signed)
Is this ok to refill?  

## 2018-07-31 ENCOUNTER — Other Ambulatory Visit: Payer: Self-pay | Admitting: Medical

## 2018-08-10 ENCOUNTER — Other Ambulatory Visit: Payer: Self-pay | Admitting: Medical

## 2018-08-21 ENCOUNTER — Other Ambulatory Visit: Payer: Self-pay | Admitting: Medical

## 2018-09-04 ENCOUNTER — Other Ambulatory Visit: Payer: Self-pay | Admitting: Medical

## 2018-10-17 ENCOUNTER — Other Ambulatory Visit: Payer: Self-pay | Admitting: Medical

## 2018-10-17 NOTE — Telephone Encounter (Signed)
Is this ok to refill?  

## 2018-10-23 ENCOUNTER — Other Ambulatory Visit: Payer: Self-pay | Admitting: Medical

## 2018-11-07 ENCOUNTER — Other Ambulatory Visit: Payer: Self-pay | Admitting: Medical

## 2018-11-29 DIAGNOSIS — X32XXXD Exposure to sunlight, subsequent encounter: Secondary | ICD-10-CM | POA: Diagnosis not present

## 2018-11-29 DIAGNOSIS — L570 Actinic keratosis: Secondary | ICD-10-CM | POA: Diagnosis not present

## 2018-12-19 DIAGNOSIS — M9901 Segmental and somatic dysfunction of cervical region: Secondary | ICD-10-CM | POA: Diagnosis not present

## 2018-12-19 DIAGNOSIS — M5031 Other cervical disc degeneration,  high cervical region: Secondary | ICD-10-CM | POA: Diagnosis not present

## 2019-01-20 ENCOUNTER — Ambulatory Visit (INDEPENDENT_AMBULATORY_CARE_PROVIDER_SITE_OTHER): Payer: Medicare Other | Admitting: Medical

## 2019-01-20 ENCOUNTER — Encounter: Payer: Self-pay | Admitting: Medical

## 2019-01-20 VITALS — BP 138/78 | HR 81 | Temp 98.4°F | Ht 69.0 in | Wt 189.0 lb

## 2019-01-20 DIAGNOSIS — Z8546 Personal history of malignant neoplasm of prostate: Secondary | ICD-10-CM | POA: Diagnosis not present

## 2019-01-20 DIAGNOSIS — W57XXXA Bitten or stung by nonvenomous insect and other nonvenomous arthropods, initial encounter: Secondary | ICD-10-CM

## 2019-01-20 DIAGNOSIS — R350 Frequency of micturition: Secondary | ICD-10-CM | POA: Insufficient documentation

## 2019-01-20 DIAGNOSIS — Z23 Encounter for immunization: Secondary | ICD-10-CM

## 2019-01-20 DIAGNOSIS — Z87898 Personal history of other specified conditions: Secondary | ICD-10-CM | POA: Diagnosis not present

## 2019-01-20 LAB — POCT URINALYSIS DIP (PROADVANTAGE DEVICE)
Bilirubin, UA: NEGATIVE
Glucose, UA: NEGATIVE mg/dL
Ketones, POC UA: NEGATIVE mg/dL
Nitrite, UA: NEGATIVE
Specific Gravity, Urine: 1.025
Urobilinogen, Ur: NEGATIVE
pH, UA: 6 (ref 5.0–8.0)

## 2019-01-20 LAB — POCT UA - MICROSCOPIC ONLY: RBC, urine, microscopic: 2

## 2019-01-20 MED ORDER — SULFAMETHOXAZOLE-TRIMETHOPRIM 800-160 MG PO TABS: 1.0000 | ORAL_TABLET | Freq: Two times a day (BID) | ORAL | 0 refills | Status: DC

## 2019-01-20 NOTE — Progress Notes (Signed)
Done faxed over

## 2019-01-20 NOTE — Progress Notes (Signed)
Subjective:  Matthew Mckay is a 78 y.o. male who presents for Chief Complaint  Patient presents with  . Tick Removal    has red mark on left side of Abdomen-happened in May   . Urinary Frequency     Here for 2 issues.    Had tick attached in May he pulled off after noticing it.   He brought the dead tick in today.  Never had fever, body aches ,joint aches.     Been working at [employer] 21 years as of this year.  Went down [place] in July with family, had a great time for his 78th birthday.  He notes starting yesterday having some urinary frequency, then some dribbling, burning.    Sees urology next month for follow up in general.  Had urinary retention and urethral blockage last year.    No other aggravating or relieving factors.    No other c/o.  The following portions of the patient's history were reviewed and updated as appropriate: allergies, current medications, past family history, past medical history, past social history, past surgical history and problem list.  ROS Otherwise as in subjective above  Objective: BP 138/78   Pulse 81   Temp 98.4 F (36.9 C)   Ht 5\' 9"  (1.753 m)   Wt 189 lb (85.7 kg)   SpO2 97%   BMI 27.91 kg/m   General appearance: alert, no distress, well developed, well nourished Abdomen: +bs, soft, non tender, non distended, no masses, no hepatomegaly, no splenomegaly Pulses: 2+ radial pulses, 2+ pedal pulses, normal cap refill Ext: no edema Skin: small flat roundish 80mm area on left inguinal region from prior tick bite, no induration, no fluctuance, no warmth    Assessment: Encounter Diagnoses  Name Primary?  . Frequent urination Yes  . Need for influenza vaccination   . History of prostate cancer   . History of urinary retention   . Tick bite, initial encounter      Plan: Urinalysis reviewed, abnormal, urine microscopic showing white cells, some RBCs.    Will treat for possible urinary infection with bactrim, hydrate well  with water.  Will send for culture.  Advised if worsening symptoms over weekend, fever, worse pain, nausea, vomiting, or symptoms of retention like he had a year ago, then report to the ED over the weekend.   He has follow up with urology in about a month for hx/o retention, hx/o prostate cancer, but may need to see urology sooner.    Tick bite - he never developed worrisome symptoms from tick bite months ago.    Reassured.  Counseled on the influenza virus vaccine.  Vaccine information sheet given.  Influenza vaccine given after consent obtained.   [name] was seen today for tick removal and urinary frequency.  Diagnoses and all orders for this visit:  Frequent urination -     POCT Urinalysis DIP (Proadvantage Device) -     Urine Culture  Need for influenza vaccination -     Flu Vaccine QUAD High Dose(Fluad)  History of prostate cancer -     Urine Culture  History of urinary retention -     Urine Culture  Tick bite, initial encounter  Other orders -     sulfamethoxazole-trimethoprim (BACTRIM DS) 800-160 MG tablet; Take 1 tablet by mouth 2 (two) times daily.    Follow up: pending labs

## 2019-01-20 NOTE — Addendum Note (Signed)
Addended by: Edgar Frisk on: 01/20/2019 11:07 AM   Modules accepted: Orders

## 2019-01-22 LAB — URINE CULTURE

## 2019-01-24 ENCOUNTER — Other Ambulatory Visit: Payer: Self-pay | Admitting: Medical

## 2019-02-10 ENCOUNTER — Ambulatory Visit (HOSPITAL_COMMUNITY): Payer: Medicare Other | Admitting: Certified Registered"

## 2019-02-10 ENCOUNTER — Other Ambulatory Visit (HOSPITAL_COMMUNITY): Admission: RE | Admit: 2019-02-10 | Payer: Medicare Other | Source: Ambulatory Visit

## 2019-02-10 ENCOUNTER — Encounter (HOSPITAL_COMMUNITY): Payer: Self-pay | Admitting: *Deleted

## 2019-02-10 ENCOUNTER — Other Ambulatory Visit: Payer: Self-pay | Admitting: Urology

## 2019-02-10 ENCOUNTER — Other Ambulatory Visit: Payer: Self-pay

## 2019-02-10 ENCOUNTER — Ambulatory Visit (HOSPITAL_COMMUNITY): Payer: Medicare Other

## 2019-02-10 ENCOUNTER — Ambulatory Visit (HOSPITAL_COMMUNITY)
Admission: RE | Admit: 2019-02-10 | Discharge: 2019-02-10 | Disposition: A | Discharge status: Home / Self Care | Payer: Medicare Other | Source: Other Acute Inpatient Hospital | Attending: Urology | Admitting: Urology

## 2019-02-10 ENCOUNTER — Encounter (HOSPITAL_COMMUNITY)
Admission: RE | Disposition: A | Discharge status: Home / Self Care | Payer: Self-pay | Source: Other Acute Inpatient Hospital | Attending: Urology

## 2019-02-10 DIAGNOSIS — I10 Essential (primary) hypertension: Secondary | ICD-10-CM | POA: Insufficient documentation

## 2019-02-10 DIAGNOSIS — N35819 Other urethral stricture, male, unspecified site: Secondary | ICD-10-CM | POA: Diagnosis not present

## 2019-02-10 DIAGNOSIS — N35919 Unspecified urethral stricture, male, unspecified site: Secondary | ICD-10-CM | POA: Diagnosis not present

## 2019-02-10 DIAGNOSIS — Z20828 Contact with and (suspected) exposure to other viral communicable diseases: Secondary | ICD-10-CM | POA: Insufficient documentation

## 2019-02-10 DIAGNOSIS — K219 Gastro-esophageal reflux disease without esophagitis: Secondary | ICD-10-CM | POA: Insufficient documentation

## 2019-02-10 DIAGNOSIS — Z8546 Personal history of malignant neoplasm of prostate: Secondary | ICD-10-CM | POA: Diagnosis not present

## 2019-02-10 DIAGNOSIS — R339 Retention of urine, unspecified: Secondary | ICD-10-CM | POA: Insufficient documentation

## 2019-02-10 DIAGNOSIS — E785 Hyperlipidemia, unspecified: Secondary | ICD-10-CM | POA: Diagnosis not present

## 2019-02-10 DIAGNOSIS — R338 Other retention of urine: Secondary | ICD-10-CM | POA: Diagnosis not present

## 2019-02-10 DIAGNOSIS — N320 Bladder-neck obstruction: Secondary | ICD-10-CM | POA: Diagnosis not present

## 2019-02-10 HISTORY — DX: Other seasonal allergic rhinitis: J30.2

## 2019-02-10 HISTORY — PX: CYSTOSCOPY WITH URETHRAL DILATATION: SHX5125

## 2019-02-10 LAB — BASIC METABOLIC PANEL
Anion gap: 11 (ref 5–15)
BUN: 25 mg/dL — ABNORMAL HIGH (ref 8–23)
CO2: 24 mmol/L (ref 22–32)
Calcium: 9.2 mg/dL (ref 8.9–10.3)
Chloride: 102 mmol/L (ref 98–111)
Creatinine, Ser: 0.96 mg/dL (ref 0.61–1.24)
GFR calc Af Amer: >60 mL/min (ref >60)
GFR calc non Af Amer: >60 mL/min (ref >60)
Glucose, Bld: 121 mg/dL — ABNORMAL HIGH (ref 70–99)
Potassium: 3.8 mmol/L (ref 3.5–5.1)
Sodium: 137 mmol/L (ref 135–145)

## 2019-02-10 LAB — SARS CORONAVIRUS 2 BY RT PCR (HOSPITAL ORDER, PERFORMED IN ~~LOC~~ HOSPITAL LAB): SARS Coronavirus 2: NEGATIVE

## 2019-02-10 SURGERY — CYSTOSCOPY, WITH URETHRAL DILATION
Anesthesia: General | Site: Urethra

## 2019-02-10 MED ORDER — PROPOFOL 10 MG/ML IV BOLUS
INTRAVENOUS | Status: AC
  Filled 2019-02-10: qty 20

## 2019-02-10 MED ORDER — MEPERIDINE HCL 50 MG/ML IJ SOLN: 6.2500 mg | INTRAMUSCULAR | Status: DC | PRN

## 2019-02-10 MED ORDER — TRAMADOL HCL 50 MG PO TABS: 50.0000 mg | ORAL_TABLET | Freq: Four times a day (QID) | ORAL | 0 refills | Status: AC | PRN

## 2019-02-10 MED ORDER — PROPOFOL 10 MG/ML IV BOLUS
INTRAVENOUS | Status: DC | PRN
  Administered 2019-02-10: 160 mg via INTRAVENOUS

## 2019-02-10 MED ORDER — ONDANSETRON HCL 4 MG/2ML IJ SOLN
INTRAMUSCULAR | Status: DC | PRN
  Administered 2019-02-10: 4 mg via INTRAVENOUS

## 2019-02-10 MED ORDER — METOCLOPRAMIDE HCL 5 MG/ML IJ SOLN: 10.0000 mg | Freq: Once | INTRAMUSCULAR | Status: DC | PRN

## 2019-02-10 MED ORDER — FENTANYL CITRATE (PF) 100 MCG/2ML IJ SOLN
INTRAMUSCULAR | Status: AC
  Filled 2019-02-10: qty 2

## 2019-02-10 MED ORDER — IOHEXOL 300 MG/ML  SOLN
INTRAMUSCULAR | Status: DC | PRN
  Administered 2019-02-10: 10 mL via URETHRAL

## 2019-02-10 MED ORDER — LACTATED RINGERS IV SOLN
INTRAVENOUS | Status: DC
  Administered 2019-02-10: 15:00:00 via INTRAVENOUS

## 2019-02-10 MED ORDER — FENTANYL CITRATE (PF) 100 MCG/2ML IJ SOLN
INTRAMUSCULAR | Status: DC | PRN
  Administered 2019-02-10 (×2): 50 ug via INTRAVENOUS

## 2019-02-10 MED ORDER — SODIUM CHLORIDE 0.9 % IR SOLN
Status: DC | PRN
  Administered 2019-02-10: 3000 mL

## 2019-02-10 MED ORDER — CEFAZOLIN SODIUM-DEXTROSE 2-4 GM/100ML-% IV SOLN
2.0000 g | INTRAVENOUS | Status: AC
  Administered 2019-02-10: 2 g via INTRAVENOUS

## 2019-02-10 MED ORDER — SULFAMETHOXAZOLE-TRIMETHOPRIM 800-160 MG PO TABS: 1.0000 | ORAL_TABLET | Freq: Two times a day (BID) | ORAL | 0 refills | Status: DC

## 2019-02-10 MED ORDER — DEXAMETHASONE SODIUM PHOSPHATE 10 MG/ML IJ SOLN
INTRAMUSCULAR | Status: DC | PRN
  Administered 2019-02-10: 8 mg via INTRAVENOUS

## 2019-02-10 MED ORDER — ONDANSETRON HCL 4 MG/2ML IJ SOLN
INTRAMUSCULAR | Status: AC
  Filled 2019-02-10: qty 2

## 2019-02-10 MED ORDER — PHENYLEPHRINE 40 MCG/ML (10ML) SYRINGE FOR IV PUSH (FOR BLOOD PRESSURE SUPPORT)
PREFILLED_SYRINGE | INTRAVENOUS | Status: DC | PRN
  Administered 2019-02-10: 120 ug via INTRAVENOUS

## 2019-02-10 MED ORDER — LIDOCAINE 2% (20 MG/ML) 5 ML SYRINGE
INTRAMUSCULAR | Status: DC | PRN
  Administered 2019-02-10: 100 mg via INTRAVENOUS

## 2019-02-10 MED ORDER — FENTANYL CITRATE (PF) 100 MCG/2ML IJ SOLN: 25.0000 ug | INTRAMUSCULAR | Status: DC | PRN

## 2019-02-10 MED ORDER — EPHEDRINE SULFATE-NACL 50-0.9 MG/10ML-% IV SOSY: PREFILLED_SYRINGE | INTRAVENOUS | Status: DC | PRN

## 2019-02-10 MED ORDER — CEFAZOLIN SODIUM-DEXTROSE 2-4 GM/100ML-% IV SOLN
INTRAVENOUS | Status: AC
  Filled 2019-02-10: qty 100

## 2019-02-10 SURGICAL SUPPLY — 38 items
BAG URINE DRAINAGE (UROLOGICAL SUPPLIES) ×4 IMPLANT
BAG URO CATCHER STRL LF (MISCELLANEOUS) ×4 IMPLANT
BALLN NEPHROSTOMY (BALLOONS) ×4
BALLOON NEPHROSTOMY (BALLOONS) IMPLANT
BLADE CLIPPER SURG (BLADE) IMPLANT
CATH BONANNO SUPRAPUBIC 14G (CATHETERS) IMPLANT
CATH FOLEY 2W COUNCIL 20FR 5CC (CATHETERS) IMPLANT
CATH FOLEY 2WAY SLVR  5CC 16FR (CATHETERS)
CATH FOLEY 2WAY SLVR  5CC 18FR (CATHETERS)
CATH FOLEY 2WAY SLVR 5CC 16FR (CATHETERS) IMPLANT
CATH FOLEY 2WAY SLVR 5CC 18FR (CATHETERS) IMPLANT
CATH INTERMIT  6FR 70CM (CATHETERS) ×2 IMPLANT
CATH ROBINSON RED A/P 14FR (CATHETERS) ×4 IMPLANT
CATH URET 5FR 28IN CONE TIP (BALLOONS)
CATH URET 5FR 70CM CONE TIP (BALLOONS) IMPLANT
CLOTH BEACON ORANGE TIMEOUT ST (SAFETY) ×4 IMPLANT
COVER SURGICAL LIGHT HANDLE (MISCELLANEOUS) ×4 IMPLANT
COVER WAND RF STERILE (DRAPES) IMPLANT
ELECT PENCIL ROCKER SW 15FT (MISCELLANEOUS) IMPLANT
ELECT REM PT RETURN 15FT ADLT (MISCELLANEOUS) IMPLANT
GLOVE BIO SURGEON STRL SZ7.5 (GLOVE) ×4 IMPLANT
GLOVE BIO SURGEON STRL SZ8 (GLOVE) ×4 IMPLANT
GOWN STRL REUS W/TWL LRG LVL3 (GOWN DISPOSABLE) ×8 IMPLANT
GUIDEWIRE ANG ZIPWIRE 038X150 (WIRE) IMPLANT
GUIDEWIRE STR DUAL SENSOR (WIRE) ×4 IMPLANT
HOLDER FOLEY CATH W/STRAP (MISCELLANEOUS) IMPLANT
IV NS IRRIG 3000ML ARTHROMATIC (IV SOLUTION) IMPLANT
KIT TURNOVER KIT A (KITS) IMPLANT
MANIFOLD NEPTUNE II (INSTRUMENTS) IMPLANT
NDL HYPO 25X1 1.5 SAFETY (NEEDLE) ×2 IMPLANT
NEEDLE HYPO 25X1 1.5 SAFETY (NEEDLE) ×4 IMPLANT
NS IRRIG 1000ML POUR BTL (IV SOLUTION) IMPLANT
PACK CYSTO (CUSTOM PROCEDURE TRAY) ×4 IMPLANT
SUT SILK 0 (SUTURE) ×2
SUT SILK 0 30XBRD TIE 6 (SUTURE) ×2 IMPLANT
SUT SILK 2 0 FS (SUTURE) IMPLANT
SYRINGE IRR TOOMEY STRL 70CC (SYRINGE) IMPLANT
WATER STERILE IRR 3000ML UROMA (IV SOLUTION) ×4 IMPLANT

## 2019-02-10 NOTE — Transfer of Care (Signed)
Immediate Anesthesia Transfer of Care Note  Patient: Matthew Mckay  Procedure(s) Performed: CYSTOSCOPY WITH URETHRAL DILATATION, (N/A Urethra)  Patient Location: PACU  Anesthesia Type:General  Level of Consciousness: drowsy, patient cooperative and responds to stimulation  Airway & Oxygen Therapy: Patient Spontanous Breathing and Patient connected to face mask oxygen  Post-op Assessment: Report given to RN and Post -op Vital signs reviewed and stable  Post vital signs: Reviewed and stable  Last Vitals:  Vitals Value Taken Time  BP 125/72 02/10/19 1726  Temp    Pulse 79 02/10/19 1728  Resp 13 02/10/19 1728  SpO2 99 % 02/10/19 1728  Vitals shown include unvalidated device data.  Last Pain:  Vitals:   02/10/19 1352  TempSrc: Oral  PainSc: 0-No pain         Complications: No apparent anesthesia complications

## 2019-02-10 NOTE — Brief Op Note (Signed)
02/10/2019  5:10 PM  PATIENT:  Matthew Mckay  78 y.o. male  PRE-OPERATIVE DIAGNOSIS:  urinary retention  POST-OPERATIVE DIAGNOSIS:  urinary retention  PROCEDURE:  Procedure(s): CYSTOSCOPY WITH URETHRAL DILATATION, (N/A)  SURGEON:  Surgeon(s) and Role:    * Alexis Frock, MD - Primary  PHYSICIAN ASSISTANT:   ASSISTANTS: none   ANESTHESIA:   general  EBL:  minimal   BLOOD ADMINISTERED:none  DRAINS: 60F council foley to gravity  LOCAL MEDICATIONS USED:  NONE  SPECIMEN:  Source of Specimen:  urine for gram stain and culture  DISPOSITION OF SPECIMEN:  microbiology  COUNTS:  YES  TOURNIQUET:  * No tourniquets in log *  DICTATION: .Other Dictation: Dictation Number [id number]  PLAN OF CARE: Discharge to home after PACU  PATIENT DISPOSITION:  PACU - hemodynamically stable.   Delay start of Pharmacological VTE agent (>24hrs) due to surgical blood loss or risk of bleeding: yes

## 2019-02-10 NOTE — Discharge Instructions (Signed)
1 - You may have urinary urgency (bladder spasms) and bloody urine on / off while catheter in place. This is normal.  2 - Call MD or go to ER for fever >102, severe pain / nausea / vomiting not relieved by medications, or acute change in medical status

## 2019-02-10 NOTE — Anesthesia Postprocedure Evaluation (Signed)
Anesthesia Post Note  Patient: Matthew Mckay  Procedure(s) Performed: CYSTOSCOPY WITH URETHRAL DILATATION, (N/A Urethra)     Patient location during evaluation: PACU Anesthesia Type: General Level of consciousness: awake and alert Pain management: pain level controlled Vital Signs Assessment: post-procedure vital signs reviewed and stable Respiratory status: spontaneous breathing, nonlabored ventilation, respiratory function stable and patient connected to nasal cannula oxygen Cardiovascular status: blood pressure returned to baseline and stable Postop Assessment: no apparent nausea or vomiting Anesthetic complications: no    Last Vitals:  Vitals:   02/10/19 1800 02/10/19 1815  BP: 123/73 121/75  Pulse: 77 76  Resp: 12 18  Temp: 36.6 C 36.6 C  SpO2: 94% 94%    Last Pain:  Vitals:   02/10/19 1815  TempSrc:   PainSc: 2                  Montez Hageman

## 2019-02-10 NOTE — Anesthesia Preprocedure Evaluation (Signed)
Anesthesia Evaluation  Patient identified by MRN, date of birth, ID band Patient awake    Reviewed: Allergy & Precautions, NPO status , Patient's Chart, lab work & pertinent test results  Airway Mallampati: II  TM Distance: >3 FB Neck ROM: Full    Dental no notable dental hx.    Pulmonary neg pulmonary ROS,    Pulmonary exam normal breath sounds clear to auscultation       Cardiovascular hypertension, Pt. on medications negative cardio ROS Normal cardiovascular exam Rhythm:Regular Rate:Normal     Neuro/Psych negative neurological ROS  negative psych ROS   GI/Hepatic negative GI ROS, Neg liver ROS,   Endo/Other  negative endocrine ROS  Renal/GU negative Renal ROS  negative genitourinary   Musculoskeletal negative musculoskeletal ROS (+)   Abdominal   Peds negative pediatric ROS (+)  Hematology negative hematology ROS (+)   Anesthesia Other Findings   Reproductive/Obstetrics negative OB ROS                             Anesthesia Physical Anesthesia Plan  ASA: II  Anesthesia Plan: General   Post-op Pain Management:    Induction: Intravenous  PONV Risk Score and Plan: 2 and Ondansetron and Treatment may vary due to age or medical condition  Airway Management Planned: LMA and Oral ETT  Additional Equipment:   Intra-op Plan:   Post-operative Plan: Extubation in OR  Informed Consent: I have reviewed the patients History and Physical, chart, labs and discussed the procedure including the risks, benefits and alternatives for the proposed anesthesia with the patient or authorized representative who has indicated his/her understanding and acceptance.     Dental advisory given  Plan Discussed with: CRNA  Anesthesia Plan Comments:         Anesthesia Quick Evaluation

## 2019-02-10 NOTE — H&P (Signed)
Matthew Mckay is an 78 y.o. male.    Chief Complaint: Pre-OP Urethral Dilation v. SPT Placement.   HPI:   1 - Locally Advanced Prostate Cancer - s/p open prostatectomy 1995 + adjuvant radiaiton in [place].   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   Recent Course:  Stricture / [place] recurrence resulting in urinary retention 01/2018 ==> bedside balloon dilation to 59F, passed subsequent trial of void.   PMH otherwise unremarkable. No ischemic CV disease / blood thinners. He is retired from [employer] in [place], now works at [employer] part time for fun. His PCP is Dr. Redmond School.   Today " Matthew Mckay " os seem for operative cysto / retrograde urethrogram / dilation for recurrent stricutre disease. No fevers. PVR earler today 28mL and dribbling some urine.     Past Medical History:  Diagnosis Date  . Allergy   . Anxiety   . Chronic sinusitis   . Diverticulosis    sigmoid colon and increased vascularization due to prior radiation, per 2013 colonoscopy  . GERD (gastroesophageal reflux disease)   . Hemorrhoid   . Hyperlipidemia   . Hypertension   . Prostate cancer [facility])    history of radiation therapy  . Urinary retention 2019    Past Surgical History:  Procedure Laterality Date  . COLONOSCOPY  02/2012   sigmoid diverticulosis, polyps, Dr. Clarene Essex  . PROSTATECTOMY     1990s    Family History  Problem Relation Age of Onset  . Cancer Brother        stomach  . Arthritis Brother   . Arthritis Brother   . Hypertension Brother   . Hypertension Brother   . Hypertension Mother   . Cancer Father        lung, smoked 2 ppd  . Cancer Paternal Uncle        cancer  . Cancer Paternal Uncle        stomach   Social History:  reports that he has never smoked. He has never used smokeless tobacco. He reports that he does not drink alcohol or use  drugs.  Allergies: No Known Allergies  No medications prior to admission.    No results found for this or any previous visit (from the past 48 hour(s)). No results found.  Review of Systems  Constitutional: Negative for chills and fever.  HENT: Negative.   Eyes: Negative.   Respiratory: Negative.   Cardiovascular: Negative.   Genitourinary: Positive for frequency and urgency.  Skin: Negative.   Neurological: Negative.   Endo/Heme/Allergies: Negative.   Psychiatric/Behavioral: Negative.     There were no vitals taken for this visit. Physical Exam  HENT:  Head: Normocephalic.  Eyes: Pupils are equal, round, and reactive to light.  Neck: Normal range of motion.  Cardiovascular: Normal rate.  Respiratory: Effort normal.  GI: Soft.  Genitourinary:    Genitourinary Comments: SP distention and TTP. No CVAT.    Neurological: He is alert.  Skin: Skin is warm.  Psychiatric: He has a normal mood and affect.     Assessment/Plan  Proceed as planned with cysto / urethral dilation / RUG, possible SPT if retrograde approach not possible. SPT would need to be open as prior prostatectomy. Risks, benefits, alternatives, expected peri-op course discussed.   Alexis Frock, MD 02/10/2019, 1:04 PM

## 2019-02-10 NOTE — Anesthesia Procedure Notes (Signed)
Procedure Name: LMA Insertion Date/Time: 02/10/2019 4:39 PM Performed by: Silas Sacramento, CRNA Pre-anesthesia Checklist: Patient identified, Emergency Drugs available, Suction available and Patient being monitored Patient Re-evaluated:Patient Re-evaluated prior to induction Oxygen Delivery Method: Circle system utilized Preoxygenation: Pre-oxygenation with 100% oxygen Induction Type: IV induction LMA: LMA inserted LMA Size: 4.0 Tube type: Oral Number of attempts: 1 Placement Confirmation: positive ETCO2 and breath sounds checked- equal and bilateral Tube secured with: Tape Dental Injury: Teeth and Oropharynx as per pre-operative assessment

## 2019-02-10 NOTE — Progress Notes (Signed)
SPOKE W/  patient     SCREENING SYMPTOMS OF COVID 19:   COUGH--no  RUNNY NOSE---yes (allergies)  SORE THROAT---no   NASAL CONGESTION----no  SNEEZING----yes (allergies)  SHORTNESS OF BREATH---no   DIFFICULTY BREATHING---no   TEMP >100.0 -----no  UNEXPLAINED BODY ACHES------no  CHILLS -------- no  HEADACHES ---------no  LOSS OF SMELL/ TASTE --------no    HAVE YOU OR ANY FAMILY MEMBER TRAVELLED PAST 14 DAYS OUT OF THE   COUNTY---no STATE----no COUNTRY----no  HAVE YOU OR ANY FAMILY MEMBER BEEN EXPOSED TO ANYONE WITH COVID 19?   no

## 2019-02-11 ENCOUNTER — Encounter (HOSPITAL_COMMUNITY): Payer: Self-pay | Admitting: Urology

## 2019-02-11 LAB — CBC
HCT: 43.2 % (ref 39.0–52.0)
Hemoglobin: 13.6 g/dL (ref 13.0–17.0)
MCH: 29.4 pg (ref 26.0–34.0)
MCHC: 31.5 g/dL (ref 30.0–36.0)
MCV: 93.3 fL (ref 80.0–100.0)
Platelets: 242 10*3/uL (ref 150–400)
RBC: 4.63 MIL/uL (ref 4.22–5.81)
RDW: 13 % (ref 11.5–15.5)
WBC: 14.2 10*3/uL — ABNORMAL HIGH (ref 4.0–10.5)
nRBC: 0 % (ref 0.0–0.2)

## 2019-02-11 NOTE — Op Note (Signed)
NAME: Matthew Mckay, Matthew Mckay MEDICAL RECORD [id number] ACCOUNT [id number] DATE OF BIRTH:1940-04-21 FACILITY: [facility] LOCATION: [facility] PHYSICIAN:Tyianna Menefee, MD  OPERATIVE REPORT  DATE OF PROCEDURE:  02/10/2019  PREOPERATIVE DIAGNOSIS:  Recurrent urethral stricture, bladder neck contracture.  PROCEDURE: 1.  Cystoscopy, retrograde urethrogram. 2.  Balloon dilation of urethral stricture, bladder neck contracture. 3.  Catheter placement, complicated.  ESTIMATED BLOOD LOSS:  Nil.  COMPLICATIONS:  None.  SPECIMENS:  Urine for Gram stain and culture.  FINDINGS: 1.  Dense multifocal stricture including pendulous stricture as well as bladder neck contracture, predilation 6 [place], post-dilation 24 [place]. 2.  Unremarkable urinary bladder, mild radiation cystitis changes. 3.  Successful placement of 20-French Councill catheter into the urinary bladder.  INDICATIONS:  The patient is a quite pleasant and very vigorous 78 year old man with remote history of prostate cancer status post surgery and radiation years ago in [place].  He has since moved to the [place] area and been managed here.  He  unfortunately also has a history of recurrent bladder neck contracture and urethral stricture requiring dilation every several years.  He had this most recently done approximately 2016.  He presented to our office today with a prodrome of worsening  irritative and obstructive voiding most consistent with likely stricture recurrence.  His postvoid residual in the office was only 200 mL.  However, he was in a significant amount of pain and was only dribbling a mild amount of the urine.  His stricture  was noted to be quite dense in the office and not amenable to office management.  He was counseled towards semi-urgent operative management with plan to reestablish proper GU drainage with hopeful urethral dilation versus open SPT placement.  He wished  to proceed.  Informed consent was obtained and placed  in the medical record.  PROCEDURE IN DETAIL:  The patient being identified and procedure being cystoscopy, retrograde urethrogram and urethral dilation were confirmed.  Procedure timeout was performed.  Intravenous antibiotics were administered.  General anesthesia was induced.   The patient was placed into a low lithotomy position.  A sterile field was created by prepping and draping the base of the penis, perineum, and proximal thighs using iodine.  The patient's meatus and distal urethra were very, very narrow with  stricture.  They are not accommodating cystoscope whatsoever.  Therefore, a 0.038 sensor wire was carefully advanced under fluoroscopic vision and coiled in the area of suspected urinary bladder.  A 6-French open-ended catheter was advanced over this,  and the area of stricturing was palpated along this at the pendulous urethra distal and bladder neck and just barely accommodating this.  Next, retrograde urethrogram was obtained.  Retrograde urethrogram revealed multifocal narrowing of the bladder neck and pendulous urethra as expected and very full urinary bladder.  The ZIPwire was once again advanced to the lower urinary bladder over which the 24-French urethral dilation  apparatus was carefully traversed across the area of the bladder neck, inflated to a pressure of 20 atmospheres, held for 90 seconds, then repositioned distally such that it traversed in the pendulous urethra, again inflated to a pressure of 20  atmospheres, held for 90 seconds, then released.  Keeping the sensor wire in place as a safety wire, cystourethroscopy was performed with a 21-French rigid cystoscope with offset lens.  This revealed complete resolution of the pendulous urethral  stricture.  No evidence of bulbar narrowing.  The bladder neck was very fixed, consistent with prior radiation, but now after dilation did easily accommodate  the cystoscope to the level of the urinary bladder.  The urinary bladder  revealed some mild  radiation cystitis changes.  Ureteral orifices were singleton bilaterally.  A sample of urine was obtained via cystoscope and set aside for Gram stain and culture in case the patient developed later infectious parameters.  The cystoscope was then  removed, and a 20-French Foley Councill-type catheter was placed over the remaining sensor safety wire.  I filled the urinary bladder at 10 mL around the balloon and set to straight drain.  The procedure was terminated.  The patient tolerated the  procedure well.  No immediate complications.  The patient was taken to postanesthesia care unit in stable condition.  His foreskin was reduced prior to leaving the operating room.  LN/NUANCE  D:02/10/2019 T:02/11/2019 JOB:[id number]

## 2019-02-13 LAB — URINE CULTURE: Culture: >=100000 — AB

## 2019-02-14 ENCOUNTER — Other Ambulatory Visit: Payer: Self-pay | Admitting: Medical

## 2019-02-15 LAB — ANAEROBIC CULTURE

## 2019-02-27 DIAGNOSIS — R338 Other retention of urine: Secondary | ICD-10-CM | POA: Diagnosis not present

## 2019-02-27 DIAGNOSIS — N320 Bladder-neck obstruction: Secondary | ICD-10-CM | POA: Diagnosis not present

## 2019-03-13 ENCOUNTER — Other Ambulatory Visit: Payer: Self-pay | Admitting: Medical

## 2019-04-04 ENCOUNTER — Other Ambulatory Visit: Payer: Self-pay | Admitting: Medical

## 2019-05-12 ENCOUNTER — Other Ambulatory Visit: Payer: Self-pay | Admitting: Medical

## 2019-06-08 ENCOUNTER — Other Ambulatory Visit: Payer: Self-pay | Admitting: Medical

## 2019-06-11 ENCOUNTER — Ambulatory Visit: Payer: Medicare Other | Attending: Internal Medicine

## 2019-06-11 DIAGNOSIS — Z23 Encounter for immunization: Secondary | ICD-10-CM | POA: Insufficient documentation

## 2019-06-11 NOTE — Progress Notes (Signed)
   Covid-19 Vaccination Clinic  Name:  Matthew Mckay    MRN: [id number] DOB: 07-Feb-1941  06/11/2019  Mr. Matthew Mckay was observed post Covid-19 immunization for 15 minutes without incidence. He was provided with Vaccine Information Sheet and instruction to access the V-Safe system.   Mr. Matthew Mckay was instructed to call 911 with any severe reactions post vaccine: Marland Kitchen Difficulty breathing  . Swelling of your face and throat  . A fast heartbeat  . A bad rash all over your body  . Dizziness and weakness    Immunizations Administered    Name Date Dose VIS Date Route   Pfizer COVID-19 Vaccine 06/11/2019  2:33 PM 0.3 mL 03/31/2019 Intramuscular   Manufacturer: [place]   Lot: [id number]   [place]: [id number]

## 2019-06-22 ENCOUNTER — Encounter: Payer: Self-pay | Admitting: Medical

## 2019-06-22 ENCOUNTER — Other Ambulatory Visit: Payer: Self-pay

## 2019-06-22 ENCOUNTER — Ambulatory Visit (INDEPENDENT_AMBULATORY_CARE_PROVIDER_SITE_OTHER): Payer: Medicare Other | Admitting: Medical

## 2019-06-22 VITALS — Ht 69.0 in | Wt 180.0 lb

## 2019-06-22 DIAGNOSIS — R42 Dizziness and giddiness: Secondary | ICD-10-CM | POA: Diagnosis not present

## 2019-06-22 DIAGNOSIS — H04203 Unspecified epiphora, bilateral lacrimal glands: Secondary | ICD-10-CM | POA: Diagnosis not present

## 2019-06-22 DIAGNOSIS — J301 Allergic rhinitis due to pollen: Secondary | ICD-10-CM | POA: Diagnosis not present

## 2019-06-22 DIAGNOSIS — M542 Cervicalgia: Secondary | ICD-10-CM | POA: Diagnosis not present

## 2019-06-22 MED ORDER — OLOPATADINE HCL 0.1 % OP SOLN: 1.0000 [drp] | Freq: Two times a day (BID) | OPHTHALMIC | 2 refills | Status: DC

## 2019-06-22 MED ORDER — MECLIZINE HCL 25 MG PO TABS: 25.0000 mg | ORAL_TABLET | Freq: Two times a day (BID) | ORAL | 0 refills | Status: DC

## 2019-06-22 NOTE — Progress Notes (Signed)
Subjective:     Patient ID: Matthew Mckay, male   DOB: Jul 17, 1940, 79 y.o.   MRN: [id number]  This visit type was conducted due to national recommendations for restrictions regarding the COVID-19 Pandemic (e.g. social distancing) in an effort to limit this patient's exposure and mitigate transmission in our community.  Due to their co-morbid illnesses, this patient is at least at moderate risk for complications without adequate follow up.  This format is felt to be most appropriate for this patient at this time.    Documentation for virtual audio and video telecommunications through Zoom encounter:  The patient was located at home. The provider was located in the office. The patient did consent to this visit and is aware of possible charges through their insurance for this visit.  The other persons participating in this telemedicine service were none. Time spent on call was 20 minutes and in review of previous records 20 minutes total.  This virtual service is not related to other E/M service within previous 7 days.   HPI Chief Complaint  Patient presents with  . Sinus Problem    headache,runny nose, cough    Virtual consult for vertigo, allergies. Has chronic allergies.  Allergic to tree pollens and mold.   Takes xyzal tablet QHS and Flonase daily.   lately having worse watery eyes, head pressure, lightheaded at times.  Gets a little dizzy just getting up out of bed that improves once he is walking and moving around.  Has some ringing in both ears started today. No hearing loss.  Felt lightheaded x 3 days.  Drinks a good amount of water daily, but also drinks some diet pepsi.  No chest pain, no SOB, no dyspnea, no edema.  No fall.  No nausea or vomiting.  Has had first Covid vaccine within last 2 weeks.  .    He has found crackly sound when he turns his neck base seems to have full range of motion.  He notes the fall off the house injury a couple years ago.  Wonders if this caused some  permanent damage.  He does not have a lot of pain just feels a cracking sound when he turns his neck.  Review of Systems As in subjective    Objective:   Physical Exam Due to coronavirus pandemic stay at home measures, patient visit was virtual and they were not examined in person.   Ht 5\' 9"  (1.753 m)   Wt 180 lb (81.6 kg)   BMI 26.58 kg/m       Assessment:     Encounter Diagnoses  Name Primary?  . Allergic rhinitis due to pollen, unspecified seasonality Yes  . Watery eyes   . Vertigo   . Neck discomfort        Plan:     Discussed symptoms, concerns. Discussed limitations of virtual consult.  He has a long history of allergies and recurrence of similar symptoms this time of year.   Advised short term hold off xyzal and change to meclizine short term for 4-5 days.  After 5 days if improved, change back to Xyzal.   Begin Patanol drops for eyes.  increase water intake and less diet pepsi.    If not much improved within 5 days, or if new or worse symptoms, then return in person.  Neck discomfort - Discussed neck concerns, doing regular stretching and ROM exercise.   Return in person if decreased ROM or worse pains.  Reviewed CT scan of neck  from 2018  Matthew Mckay was seen today for sinus problem.  Diagnoses and all orders for this visit:  Allergic rhinitis due to pollen, unspecified seasonality  Watery eyes  Vertigo  Neck discomfort  Other orders -     meclizine (ANTIVERT) 25 MG tablet; Take 1 tablet (25 mg total) by mouth 2 (two) times daily. -     olopatadine (PATANOL) 0.1 % ophthalmic solution; Place 1 drop into both eyes 2 (two) times daily.

## 2019-07-01 ENCOUNTER — Other Ambulatory Visit: Payer: Self-pay | Admitting: Medical

## 2019-07-04 DIAGNOSIS — N320 Bladder-neck obstruction: Secondary | ICD-10-CM | POA: Diagnosis not present

## 2019-07-04 DIAGNOSIS — R300 Dysuria: Secondary | ICD-10-CM | POA: Diagnosis not present

## 2019-07-04 DIAGNOSIS — R8279 Other abnormal findings on microbiological examination of urine: Secondary | ICD-10-CM | POA: Diagnosis not present

## 2019-07-05 ENCOUNTER — Ambulatory Visit: Payer: Medicare Other | Attending: Internal Medicine

## 2019-07-05 DIAGNOSIS — Z23 Encounter for immunization: Secondary | ICD-10-CM

## 2019-07-05 NOTE — Progress Notes (Signed)
   Covid-19 Vaccination Clinic  Name:  Matthew Mckay    MRN: [id number] DOB: [birth year]  07/05/2019  Mr. Irizarry was observed post Covid-19 immunization for 15 minutes without incident. He was provided with Vaccine Information Sheet and instruction to access the V-Safe system.   Mr. Selinsky was instructed to call 911 with any severe reactions post vaccine: Marland Kitchen Difficulty breathing  . Swelling of face and throat  . A fast heartbeat  . A bad rash all over body  . Dizziness and weakness   Immunizations Administered    Name Date Dose VIS Date Route   Pfizer COVID-19 Vaccine 07/05/2019  9:13 AM 0.3 mL 03/31/2019 Intramuscular   Manufacturer: [place]   Lot: [id number]   [place]: [id number]

## 2019-07-26 ENCOUNTER — Other Ambulatory Visit: Payer: Self-pay | Admitting: Medical

## 2019-07-27 ENCOUNTER — Other Ambulatory Visit: Payer: Self-pay | Admitting: Family Medicine

## 2019-07-30 ENCOUNTER — Other Ambulatory Visit: Payer: Self-pay | Admitting: Medical

## 2019-07-30 DIAGNOSIS — J3089 Other allergic rhinitis: Secondary | ICD-10-CM

## 2019-07-30 DIAGNOSIS — H1013 Acute atopic conjunctivitis, bilateral: Secondary | ICD-10-CM

## 2019-08-10 ENCOUNTER — Other Ambulatory Visit: Payer: Self-pay | Admitting: Medical

## 2019-08-17 ENCOUNTER — Other Ambulatory Visit: Payer: Self-pay | Admitting: Medical

## 2019-08-17 NOTE — Telephone Encounter (Signed)
Sent mychart message and lmom for patient to call and schedule an appointment.

## 2019-08-20 ENCOUNTER — Other Ambulatory Visit: Payer: Self-pay | Admitting: Medical

## 2019-08-22 NOTE — Telephone Encounter (Signed)
Unable to reach patient by phone for appointment. No response from [employer].

## 2019-08-23 NOTE — Telephone Encounter (Signed)
Get him on the schedule for physical/med check.  And go ahead and schedule 44-month Medicare wellness visit as well  Please refill the medicine

## 2019-08-28 DIAGNOSIS — N3000 Acute cystitis without hematuria: Secondary | ICD-10-CM | POA: Diagnosis not present

## 2019-08-28 DIAGNOSIS — N320 Bladder-neck obstruction: Secondary | ICD-10-CM | POA: Diagnosis not present

## 2019-08-28 DIAGNOSIS — R338 Other retention of urine: Secondary | ICD-10-CM | POA: Diagnosis not present

## 2019-08-31 DIAGNOSIS — M9901 Segmental and somatic dysfunction of cervical region: Secondary | ICD-10-CM | POA: Diagnosis not present

## 2019-08-31 DIAGNOSIS — M50322 Other cervical disc degeneration at C5-C6 level: Secondary | ICD-10-CM | POA: Diagnosis not present

## 2019-09-04 ENCOUNTER — Other Ambulatory Visit: Payer: Self-pay | Admitting: Medical

## 2019-09-06 ENCOUNTER — Other Ambulatory Visit: Payer: Self-pay | Admitting: Medical

## 2019-09-14 ENCOUNTER — Other Ambulatory Visit: Payer: Self-pay | Admitting: Medical

## 2019-09-14 DIAGNOSIS — R300 Dysuria: Secondary | ICD-10-CM | POA: Diagnosis not present

## 2019-09-22 ENCOUNTER — Telehealth: Payer: Self-pay | Admitting: Medical

## 2019-09-22 ENCOUNTER — Ambulatory Visit (INDEPENDENT_AMBULATORY_CARE_PROVIDER_SITE_OTHER): Payer: Medicare Other | Admitting: Medical

## 2019-09-22 ENCOUNTER — Encounter: Payer: Self-pay | Admitting: Medical

## 2019-09-22 ENCOUNTER — Other Ambulatory Visit: Payer: Self-pay

## 2019-09-22 VITALS — BP 148/76 | HR 76 | Ht 69.0 in | Wt 183.6 lb

## 2019-09-22 DIAGNOSIS — K219 Gastro-esophageal reflux disease without esophagitis: Secondary | ICD-10-CM

## 2019-09-22 DIAGNOSIS — Z8546 Personal history of malignant neoplasm of prostate: Secondary | ICD-10-CM

## 2019-09-22 DIAGNOSIS — K5790 Diverticulosis of intestine, part unspecified, without perforation or abscess without bleeding: Secondary | ICD-10-CM | POA: Insufficient documentation

## 2019-09-22 DIAGNOSIS — R251 Tremor, unspecified: Secondary | ICD-10-CM

## 2019-09-22 DIAGNOSIS — E785 Hyperlipidemia, unspecified: Secondary | ICD-10-CM

## 2019-09-22 DIAGNOSIS — K5909 Other constipation: Secondary | ICD-10-CM

## 2019-09-22 DIAGNOSIS — R7301 Impaired fasting glucose: Secondary | ICD-10-CM

## 2019-09-22 DIAGNOSIS — J3089 Other allergic rhinitis: Secondary | ICD-10-CM

## 2019-09-22 DIAGNOSIS — R339 Retention of urine, unspecified: Secondary | ICD-10-CM | POA: Diagnosis not present

## 2019-09-22 DIAGNOSIS — I779 Disorder of arteries and arterioles, unspecified: Secondary | ICD-10-CM | POA: Insufficient documentation

## 2019-09-22 DIAGNOSIS — H9191 Unspecified hearing loss, right ear: Secondary | ICD-10-CM

## 2019-09-22 DIAGNOSIS — Z0000 Encounter for general adult medical examination without abnormal findings: Secondary | ICD-10-CM

## 2019-09-22 DIAGNOSIS — Z8781 Personal history of (healed) traumatic fracture: Secondary | ICD-10-CM

## 2019-09-22 DIAGNOSIS — L84 Corns and callosities: Secondary | ICD-10-CM

## 2019-09-22 DIAGNOSIS — H6121 Impacted cerumen, right ear: Secondary | ICD-10-CM | POA: Insufficient documentation

## 2019-09-22 DIAGNOSIS — R809 Proteinuria, unspecified: Secondary | ICD-10-CM | POA: Insufficient documentation

## 2019-09-22 DIAGNOSIS — M170 Bilateral primary osteoarthritis of knee: Secondary | ICD-10-CM | POA: Diagnosis not present

## 2019-09-22 DIAGNOSIS — Z7189 Other specified counseling: Secondary | ICD-10-CM

## 2019-09-22 DIAGNOSIS — R350 Frequency of micturition: Secondary | ICD-10-CM

## 2019-09-22 DIAGNOSIS — Z7185 Encounter for immunization safety counseling: Secondary | ICD-10-CM

## 2019-09-22 DIAGNOSIS — H1013 Acute atopic conjunctivitis, bilateral: Secondary | ICD-10-CM

## 2019-09-22 DIAGNOSIS — M5030 Other cervical disc degeneration, unspecified cervical region: Secondary | ICD-10-CM

## 2019-09-22 DIAGNOSIS — Z79899 Other long term (current) drug therapy: Secondary | ICD-10-CM

## 2019-09-22 NOTE — Patient Instructions (Addendum)
How can [place] people tell when it's summer? The rain gets warmer.   Why shouldn't you iron a four-leaved clover? You don't want to press your luck.   Recommendations:  Eye doctor I recommend you go ahead and see your eye doctor soon for follow-up and for chronic eye issues particularly with the right with drainage and redness as well as the cataract   Ringing in the ear I recommend referral to ear nose and throat specialist about the ringing in the ears.  Let me know if you are agreeable to this referral.  Make sure you talk about your chronic allergy problem and sinus drainage    History of prostate cancer Urinary retention, history of prostate cancer-make sure you follow-up with urology as planned  Dental See your dentist yearly or every 6 months for cleaning   Screen for osteoporosis I recommend you have a bone density test to screen for osteoporosis given the history of compression fracture in your spine.   Please call to schedule your Bone Density test.   [facility]  [phone] [address] [place], [place] [address]   Carotid Artery A few years ago you had findings suggestive of cholesterol buildup in your carotid arteries.  I recommend an updated ultrasound of your carotid arteries.  Let me know if you are agreeable and we will schedule this   Lab screening We will call with lab results from today    Tremor You have a mild tremor with motion of the arms hands.  If this has gotten worse, we can refer you to neurology for further evaluation.  It did not seem to be worse today   Impaired glucose, prediabetes I recommend a healthy low sugar low-fat diet.  We are checking your labs today for follow-up wellness   Chronic constipation You can continue MiraLAX 1 capful daily powder in your beverage.  If your problems are not improving or if they are worse let me know   High blood pressure Your blood pressure was a  little higher than usual.  For now continue the same medication.  If you noticed that you do not have as much stamina with exercise or if you have any type of chest discomfort, we would send you for cardiac evaluation.  If not, then we can continue the same treatment    Vaccines: You are due for shingles Shingrix vaccine and tetanus booster  Please call your insurance to see if they cover the tetanus diphtheria vaccine and the 2 shot shingles vaccine.  If so, then schedule a time to start the vaccine      Advance Directive  Advance directives are legal documents that let you make choices ahead of time about your health care and medical treatment in case you become unable to communicate for yourself. Advance directives are a way for you to make known your wishes to family, friends, and health care providers. This can let others know about your end-of-life care if you become unable to communicate. Discussing and writing advance directives should happen over time rather than all at once. Advance directives can be changed depending on your situation and what you want, even after you have signed the advance directives. There are different types of advance directives, such as:  Medical power of attorney.  Living will.  Do not resuscitate (DNR) or do not attempt resuscitation (DNAR) order. Health care proxy and medical power of attorney A health care proxy is also called  a health care agent. This is a person who is appointed to make medical decisions for you in cases where you are unable to make the decisions yourself. Generally, people choose someone they know well and trust to represent their preferences. Make sure to ask this person for an agreement to act as your proxy. A proxy may have to exercise judgment in the event of a medical decision for which your wishes are not known. A medical power of attorney is a legal document that names your health care proxy. Depending on the laws in your  state, after the document is written, it may also need to be:  Signed.  Notarized.  Dated.  Copied.  Witnessed.  Incorporated into your medical record. You may also want to appoint someone to manage your money in a situation in which you are unable to do so. This is called a durable power of attorney for finances. It is a separate legal document from the durable power of attorney for health care. You may choose the same person or someone different from your health care proxy to act as your agent in money matters. If you do not appoint a proxy, or if there is a concern that the proxy is not acting in your best interests, a court may appoint a guardian to act on your behalf. Living will A living will is a set of instructions that state your wishes about medical care when you cannot express them yourself. Health care providers should keep a copy of your living will in your medical record. You may want to give a copy to family members or friends. To alert caregivers in case of an emergency, you can place a card in your wallet to let them know that you have a living will and where they can find it. A living will is used if you become:  Terminally ill.  Disabled.  Unable to communicate or make decisions. Items to consider in your living will include:  To use or not to use life-support equipment, such as dialysis machines and breathing machines (ventilators).  A DNR or DNAR order. This tells health care providers not to use cardiopulmonary resuscitation (CPR) if breathing or heartbeat stops.  To use or not to use tube feeding.  To be given or not to be given food and fluids.  Comfort (palliative) care when the goal becomes comfort rather than a cure.  Donation of organs and tissues. A living will does not give instructions for distributing your money and property if you should pass away. DNR or DNAR A DNR or DNAR order is a request not to have CPR in the event that your heart stops  beating or you stop breathing. If a DNR or DNAR order has not been made and shared, a health care provider will try to help any patient whose heart has stopped or who has stopped breathing. If you plan to have surgery, talk with your health care provider about how your DNR or DNAR order will be followed if problems occur. What if I do not have an advance directive? If you do not have an advance directive, some states assign family decision makers to act on your behalf based on how closely you are related to them. Each state has its own laws about advance directives. You may want to check with your health care provider, attorney, or state representative about the laws in your state. Summary  Advance directives are the legal documents that allow you to make choices  ahead of time about your health care and medical treatment in case you become unable to tell others about your care.  The process of discussing and writing advance directives should happen over time. You can change the advance directives, even after you have signed them.  Advance directives include DNR or DNAR orders, living wills, and designating an agent as your medical power of attorney. This information is not intended to replace advice given to you by your health care provider. Make sure you discuss any questions you have with your health care provider. Document Revised: 11/03/2018 Document Reviewed: 11/03/2018 Elsevier Patient Education  [place].

## 2019-09-22 NOTE — Progress Notes (Signed)
Subjective:    Matthew Mckay is a 79 y.o. male who presents for Preventative Services visit and chronic medical problems/med check visit.    Primary Care Provider Hershy Flenner, Camelia Eng, PA-C here for primary care  Current Health Care Team: Dentist, Dr. Lafonda Mosses, Dr. Venetia Maxon, in [facility] chiropractor on [facility] Dr. Clarene Essex, GI Dr. Carmelina Peal and Johnanna Schneiders, allergist Dr. Allyn Kenner, dermatology Dr. Tresa Moore, Urology currently, had seen Urologist in [place] in the past, treatment for prostate [place].   Concerns: He was scheduled for a physical.  He was not very excited about doing routine preventative things today but was agreeable to go ahead and at least discussed screenings, labs.  Lately he has been bothered by thick wax in the right ear.  He pulled out some thick wax in today but cannot hear well out of the right ear.  He continues to have ringing in the year mainly on the right.  No dizziness.  He has chronic allergy problems with drainage, runny nose, at times this can be significant.  He has not seen his allergist in a few years.  He is compliant with his allergy medication  He saw urology recently for urinary retention and discomfort, and a course of doxycycline resolve his pain within 7 days.  He has follow-up with urology in late June  He has been at his current job for 22 years.  He continues to enjoy working at the [employer].  He and his coworkers joke, carry one and have a good time  Aaron cracking sound in his neck.  He now has some neck pain.  He knows he has some arthritis in the neck.  Last eye doctor visit was about 2 years ago  He notes that Covid kept him from going out a lot in general and prevented him going to some of his specialist for f/u   Medical Services you may have received from other than [facility] providers in the past year (date may be approximate) Urology  Exercise Current exercise habits: walking    Nutrition/Diet Current diet:  normal   Depression Screen Refused today   Activities of Daily Living Screen/Functional Status Survey Declined questions for this today  Can patient draw a clock face showing 3:15 o'clock, declines today  Fall Risk Screen Declines today   Gait Assessment: Normal gait observed yes  Advanced directives Does patient have a Health Care Power of Attorney? No  Does patient have a Living Will? No   Past Medical History:  Diagnosis Date  . Allergy   . Anxiety   . Chronic sinusitis   . Diverticulosis    sigmoid colon and increased vascularization due to prior radiation, per 2013 colonoscopy  . GERD (gastroesophageal reflux disease)   . Hemorrhoid   . Hyperlipidemia   . Hypertension   . Prostate cancer [facility])    history of radiation therapy  . Seasonal allergies   . Urinary retention 2019    Past Surgical History:  Procedure Laterality Date  . COLONOSCOPY  02/2012   sigmoid diverticulosis, polyps, Dr. Clarene Essex  . CYSTOSCOPY WITH URETHRAL DILATATION N/A 02/10/2019   Procedure: CYSTOSCOPY WITH URETHRAL DILATATION,;  Surgeon: Alexis Frock, MD;  Location: [facility];  Service: Urology;  Laterality: N/A;  . PROSTATECTOMY     1990s    Social History   Socioeconomic History  . Marital status: Married    Spouse name: Not on file  . Number of children: Not on file  .  Years of education: Not on file  . Highest education level: Not on file  Occupational History  . Not on file  Tobacco Use  . Smoking status: Never Smoker  . Smokeless tobacco: Never Used  Substance and Sexual Activity  . Alcohol use: No  . Drug use: No  . Sexual activity: Not on file  Other Topics Concern  . Not on file  Social History Narrative   Lives alone. Widowed in 2009.   Handles own ADLs.   [employer] engineer, contracting, always busy, yard work, garden work, works at [employer].    Goes to [place].   6 brothers and 3 sisters.  Originally from [place], brought up in [place].  Father is [place], mother  from [place]. Has family all over the place, [place], [place], [place], [place]   Social Determinants of Health   Financial Resource Strain:   . Difficulty of Paying Living Expenses:   Food Insecurity:   . Worried About Charity fundraiser in the Last Year:   . Arboriculturist in the Last Year:   Transportation Needs:   . Film/video editor (Medical):   Marland Kitchen Lack of Transportation (Non-Medical):   Physical Activity:   . Days of Exercise per Week:   . Minutes of Exercise per Session:   Stress:   . Feeling of Stress :   Social Connections:   . Frequency of Communication with Friends and Family:   . Frequency of Social Gatherings with Friends and Family:   . Attends Religious Services:   . Active Member of Clubs or Organizations:   . Attends Archivist Meetings:   Marland Kitchen Marital Status:   Intimate Partner Violence:   . Fear of Current or Ex-Partner:   . Emotionally Abused:   Marland Kitchen Physically Abused:   . Sexually Abused:     Family History  Problem Relation Age of Onset  . Cancer Brother        stomach  . Arthritis Brother   . Arthritis Brother   . Hypertension Brother   . Hypertension Brother   . Hypertension Mother   . Cancer Father        lung, smoked 2 ppd  . Cancer Paternal Uncle        cancer  . Cancer Paternal Uncle        stomach     Current Outpatient Medications:  .  atorvastatin (LIPITOR) 10 MG tablet, TAKE 1 TABLET BY MOUTH EVERY DAY, Disp: 90 tablet, Rfl: 0 .  famotidine (PEPCID) 40 MG tablet, TAKE 1 TABLET BY MOUTH EVERY DAY, Disp: 30 tablet, Rfl: 0 .  fluticasone (FLONASE) 50 MCG/ACT nasal spray, SPRAY 2 SPRAYS INTO EACH NOSTRIL EVERY DAY, Disp: 48 mL, Rfl: 2 .  levocetirizine (XYZAL) 5 MG tablet, TAKE 1 TABLET BY MOUTH EVERY DAY IN THE EVENING, Disp: 90 tablet, Rfl: 3 .  lisinopril-hydrochlorothiazide (ZESTORETIC) 20-12.5 MG tablet, TAKE 1 TABLET BY MOUTH EVERY DAY, Disp: 90 tablet, Rfl: 1 .  meclizine (ANTIVERT) 25 MG tablet, Take 1 tablet (25 mg  total) by mouth 2 (two) times daily., Disp: 30 tablet, Rfl: 0 .  olopatadine (PATANOL) 0.1 % ophthalmic solution, Place 1 drop into both eyes 2 (two) times daily., Disp: 5 mL, Rfl: 2 .  omeprazole (PRILOSEC) 40 MG capsule, TAKE 1 CAPSULE BY MOUTH EVERY DAY, Disp: 90 capsule, Rfl: 1 .  polyvinyl alcohol (LIQUIFILM TEARS) 1.4 % ophthalmic solution, Place 1 drop into both eyes as needed for  dry eyes., Disp: , Rfl:  .  sulfamethoxazole-trimethoprim (BACTRIM DS) 800-160 MG tablet, Take 1 tablet by mouth 2 (two) times daily. X 3 days now. Also begin day before next Urology appointment., Disp: 12 tablet, Rfl: 0 .  traMADol (ULTRAM) 50 MG tablet, Take 1-2 tablets (50-100 mg total) by mouth every 6 (six) hours as needed for moderate pain or severe pain. Post-operatively, Disp: 15 tablet, Rfl: 0 .  verapamil (CALAN-SR) 240 MG CR tablet, TAKE 1 TABLET (240 MG TOTAL) BY MOUTH AT BEDTIME., Disp: 90 tablet, Rfl: 3 .  polyethylene glycol powder (GLYCOLAX/MIRALAX) powder, 1 capfull daily in beverage (Patient not taking: Reported on 09/22/2019), Disp: 3350 g, Rfl: 3  No Known Allergies  History reviewed: allergies, current medications, past family history, past medical history, past social history, past surgical history and problem list  Chronic issues discussed: Compliant with medications    Objective:      Biometrics BP (!) 148/76   Pulse 76   Ht 5\' 9"  (1.753 m)   Wt 183 lb 9.6 oz (83.3 kg)   SpO2 96%   BMI 27.11 kg/m   Wt Readings from Last 3 Encounters:  09/22/19 183 lb 9.6 oz (83.3 kg)  06/22/19 180 lb (81.6 kg)  02/10/19 184 lb 6.4 oz (83.6 kg)   BP Readings from Last 3 Encounters:  09/22/19 (!) 148/76  02/10/19 121/75  01/20/19 138/78    General appearance: alert, no distress, WD/WN, white male Skin: No specific worrisome lesions today  HEENT: normocephalic, sclerae anicteric, right ear canal with impacted cerumen, left ear canal with mild cerumen, left TM normal, nares patent, no  discharge, mild erythema, pharynx normal Oral cavity: MMM, no lesions Neck: supple, no lymphadenopathy, no thyromegaly, no masses, no bruits Heart: RRR, normal S1, S2, no murmurs Lungs: CTA bilaterally, no wheezes, rhonchi, or rales Musculoskeletal: Bony arthritic changes noted of both knees, nontender, no swelling, no obvious deformity Extremities: no edema, no cyanosis, no clubbing Pulses: 2+ symmetric, upper and lower extremities, normal cap refill Neurological: Mild action tremor noted of hands ,alert, oriented x 3, CN2-12 intact, strength normal upper extremities and lower extremities, sensation normal throughout, DTRs 2+ throughout, no cerebellar signs, gait normal Psychiatric: normal affect, behavior normal, pleasant  Declined GU/rectal   Assessment:   Encounter Diagnoses  Name Primary?  . Encounter for health maintenance examination in adult Yes  . Urinary retention   . Corn of foot   . Gastroesophageal reflux disease, unspecified whether esophagitis present   . Other allergic rhinitis   . Osteoarthritis of both knees, unspecified osteoarthritis type   . History of compression fracture of spine   . Hyperlipidemia, unspecified hyperlipidemia type   . Tremor   . Chronic constipation   . Frequent urination   . High risk medication use   . Allergic conjunctivitis, bilateral   . Vaccine counseling   . History of prostate cancer   . Microalbuminuria   . Impaired fasting blood sugar   . Advanced directives, counseling/discussion   . DDD (degenerative disc disease), cervical   . 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 Diverticulosis   . Hearing decreased, right   . Impacted cerumen of right ear      Plan:   A preventative services visit was completed today.  During the course of the visit today, we discussed and counseled about appropriate screening and preventive services.  A health risk assessment was established today that included a review of  current medications,  allergies, social history, family history, medical and preventative health history, biometrics, and preventative screenings to identify potential safety concerns or impairments.  A personalized plan was printed today for your records and use.   Personalized health advice and education was given today to reduce health risks and promote self management and wellness.  Information regarding end of life planning was discussed today.  He was not too excited about doing preventative services today but we went ahead and at least went over the basics, screenings, labs, routine recommendations as best we could.  He is tendency is to not come in for well visits, so I tried to at least cover general recommendations and labs while we had a near.  Impacted cerumen Discussed findings.  Discussed risk/benefits of procedure and patient agrees to procedure. Successfully used warm water lavage to remove impacted cerumen from right ear canal. Patient tolerated procedure well. Advised they avoid using any cotton swabs or other devices to clean the ear canals.  Use basic hygiene as discussed.  Follow up prn.    Patient Instructions  Recommendations:  Eye doctor I recommend you go ahead and see your eye doctor soon for follow-up and for chronic eye issues particularly with the right with drainage and redness as well as the cataract   Ringing in the ear I recommend referral to ear nose and throat specialist about the ringing in the ears.  Let me know if you are agreeable to this referral.  Make sure you talk about your chronic allergy problem and sinus drainage    History of prostate cancer Urinary retention, history of prostate cancer-make sure you follow-up with urology as planned  Dental See your dentist yearly or every 6 months for cleaning   Screen for osteoporosis I recommend you have a bone density test to screen for osteoporosis given the history of compression fracture in your  spine.   Please call to schedule your Bone Density test.   [facility]  [phone] [id number] [place], [place] [address]   Carotid Artery A few years ago you had findings suggestive of cholesterol buildup in your carotid arteries.  I recommend an updated ultrasound of your carotid arteries.  Let me know if you are agreeable and we will schedule this   Lab screening We will call with lab results from today    Tremor You have a mild tremor with motion of the arms hands.  If this has gotten worse, we can refer you to neurology for further evaluation.  It did not seem to be worse today   Impaired glucose, prediabetes I recommend a healthy low sugar low-fat diet.  We are checking your labs today for follow-up wellness   Chronic constipation You can continue MiraLAX 1 capful daily powder in your beverage.  If your problems are not improving or if they are worse let me know   High blood pressure Your blood pressure was a little higher than usual.  For now continue the same medication.  If you noticed that you do not have as much stamina with exercise or if you have any type of chest discomfort, we would send you for cardiac evaluation.  If not, then we can continue the same treatment    Vaccines: You are due for shingles Shingrix vaccine and tetanus booster  Please call your insurance to see if they cover the tetanus diphtheria vaccine and the 2 shot shingles vaccine.  If so, then schedule a time  to start the vaccine      Advance Directive  Advance directives are legal documents that let you make choices ahead of time about your health care and medical treatment in case you become unable to communicate for yourself. Advance directives are a way for you to make known your wishes to family, friends, and health care providers. This can let others know about your end-of-life care if you become unable to communicate. Discussing and  writing advance directives should happen over time rather than all at once. Advance directives can be changed depending on your situation and what you want, even after you have signed the advance directives. There are different types of advance directives, such as:  Medical power of attorney.  Living will.  Do not resuscitate (DNR) or do not attempt resuscitation (DNAR) order. Health care proxy and medical power of attorney A health care proxy is also called a health care agent. This is a person who is appointed to make medical decisions for you in cases where you are unable to make the decisions yourself. Generally, people choose someone they know well and trust to represent their preferences. Make sure to ask this person for an agreement to act as your proxy. A proxy may have to exercise judgment in the event of a medical decision for which your wishes are not known. A medical power of attorney is a legal document that names your health care proxy. Depending on the laws in your state, after the document is written, it may also need to be:  Signed.  Notarized.  Dated.  Copied.  Witnessed.  Incorporated into your medical record. You may also want to appoint someone to manage your money in a situation in which you are unable to do so. This is called a durable power of attorney for finances. It is a separate legal document from the durable power of attorney for health care. You may choose the same person or someone different from your health care proxy to act as your agent in money matters. If you do not appoint a proxy, or if there is a concern that the proxy is not acting in your best interests, a court may appoint a guardian to act on your behalf. Living will A living will is a set of instructions that state your wishes about medical care when you cannot express them yourself. Health care providers should keep a copy of your living will in your medical record. You may want to give a copy  to family members or friends. To alert caregivers in case of an emergency, you can place a card in your wallet to let them know that you have a living will and where they can find it. A living will is used if you become:  Terminally ill.  Disabled.  Unable to communicate or make decisions. Items to consider in your living will include:  To use or not to use life-support equipment, such as dialysis machines and breathing machines (ventilators).  A DNR or DNAR order. This tells health care providers not to use cardiopulmonary resuscitation (CPR) if breathing or heartbeat stops.  To use or not to use tube feeding.  To be given or not to be given food and fluids.  Comfort (palliative) care when the goal becomes comfort rather than a cure.  Donation of organs and tissues. A living will does not give instructions for distributing your money and property if you should pass away. DNR or DNAR A DNR or DNAR order is  a request not to have CPR in the event that your heart stops beating or you stop breathing. If a DNR or DNAR order has not been made and shared, a health care provider will try to help any patient whose heart has stopped or who has stopped breathing. If you plan to have surgery, talk with your health care provider about how your DNR or DNAR order will be followed if problems occur. What if I do not have an advance directive? If you do not have an advance directive, some states assign family decision makers to act on your behalf based on how closely you are related to them. Each state has its own laws about advance directives. You may want to check with your health care provider, attorney, or state representative about the laws in your state. Summary  Advance directives are the legal documents that allow you to make choices ahead of time about your health care and medical treatment in case you become unable to tell others about your care.  The process of discussing and writing advance  directives should happen over time. You can change the advance directives, even after you have signed them.  Advance directives include DNR or DNAR orders, living wills, and designating an agent as your medical power of attorney. This information is not intended to replace advice given to you by your health care provider. Make sure you discuss any questions you have with your health care provider. Document Revised: 11/03/2018 Document Reviewed: 11/03/2018 Elsevier Patient Education  2020 [place] was seen today for uri.  Diagnoses and all orders for this visit:  Encounter for health maintenance examination in adult -     Comprehensive metabolic panel -     CBC with Differential/Platelet -     Lipid panel -     Microalbumin/Creatinine Ratio, Urine -     Hemoglobin A1c -     DG Bone Density; Future -     US Carotid Duplex Bilateral; Future  Urinary retention  Corn of foot  Gastroesophageal reflux disease, unspecified whether esophagitis present  Other allergic rhinitis  Osteoarthritis of both knees, unspecified osteoarthritis type  History of compression fracture of spine -     DG Bone Density; Future  Hyperlipidemia, unspecified hyperlipidemia type -     Comprehensive metabolic panel -     Lipid panel  Tremor -     Comprehensive metabolic panel -     CBC with Differential/Platelet  Chronic constipation  Frequent urination  High risk medication use  Allergic conjunctivitis, bilateral  Vaccine counseling  History of prostate cancer  Microalbuminuria -     Microalbumin/Creatinine Ratio, Urine  Impaired fasting blood sugar -     Microalbumin/Creatinine Ratio, Urine -     Hemoglobin A1c  Advanced directives, counseling/discussion  DDD (degenerative disc disease), cervical -     DG Bone Density; Future  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  During the course of the visit the patient was educated and counseled about appropriate screening and preventive services.  A health risk assessment was established with the patient that included a review of current medications, allergies, social history, family history, medical and preventative health history, biometrics, and preventative screenings to identify potential safety concerns or  impairments.  A personalized plan was printed today for the patient's records and use.   Personalized health advice and education was given today to reduce health risks and promote self management and wellness.  Information regarding end of life planning was discussed today.  Dorothea Ogle, PA-C   09/22/2019

## 2019-09-22 NOTE — Telephone Encounter (Signed)
Pts daughter [name] called and wants to see if pt can get a referral to see Dr. Constance Holster at [facility]. Pts daughter request that the ENT calls her to schedule his appt. [name] can be reached at [phone]

## 2019-09-22 NOTE — Telephone Encounter (Signed)
That is fine 

## 2019-09-23 LAB — LIPID PANEL
Chol/HDL Ratio: 4 ratio (ref 0.0–5.0)
Cholesterol, Total: 129 mg/dL (ref 100–199)
HDL: 32 mg/dL — ABNORMAL LOW (ref >39)
LDL Chol Calc (NIH): 61 mg/dL (ref 0–99)
Triglycerides: 217 mg/dL — ABNORMAL HIGH (ref 0–149)
VLDL Cholesterol Cal: 36 mg/dL (ref 5–40)

## 2019-09-23 LAB — HEMOGLOBIN A1C
Est. average glucose Bld gHb Est-mCnc: 123 mg/dL
Hgb A1c MFr Bld: 5.9 % — ABNORMAL HIGH (ref 4.8–5.6)

## 2019-09-23 LAB — COMPREHENSIVE METABOLIC PANEL
ALT: 11 IU/L (ref 0–44)
AST: 14 IU/L (ref 0–40)
Albumin/Globulin Ratio: 1.7 (ref 1.2–2.2)
Albumin: 4 g/dL (ref 3.7–4.7)
Alkaline Phosphatase: 51 IU/L (ref 48–121)
BUN/Creatinine Ratio: 21 (ref 10–24)
BUN: 20 mg/dL (ref 8–27)
Bilirubin Total: 0.2 mg/dL (ref 0.0–1.2)
CO2: 24 mmol/L (ref 20–29)
Calcium: 9.3 mg/dL (ref 8.6–10.2)
Chloride: 103 mmol/L (ref 96–106)
Creatinine, Ser: 0.94 mg/dL (ref 0.76–1.27)
GFR calc Af Amer: 88 mL/min/{1.73_m2} (ref >59)
GFR calc non Af Amer: 76 mL/min/{1.73_m2} (ref >59)
Globulin, Total: 2.4 g/dL (ref 1.5–4.5)
Glucose: 146 mg/dL — ABNORMAL HIGH (ref 65–99)
Potassium: 4.5 mmol/L (ref 3.5–5.2)
Sodium: 141 mmol/L (ref 134–144)
Total Protein: 6.4 g/dL (ref 6.0–8.5)

## 2019-09-23 LAB — CBC WITH DIFFERENTIAL/PLATELET
Basophils Absolute: 0 10*3/uL (ref 0.0–0.2)
Basos: 1 %
EOS (ABSOLUTE): 0.3 10*3/uL (ref 0.0–0.4)
Eos: 3 %
Hematocrit: 39.7 % (ref 37.5–51.0)
Hemoglobin: 13.4 g/dL (ref 13.0–17.7)
Immature Grans (Abs): 0 10*3/uL (ref 0.0–0.1)
Immature Granulocytes: 0 %
Lymphocytes Absolute: 3.2 10*3/uL — ABNORMAL HIGH (ref 0.7–3.1)
Lymphs: 38 %
MCH: 29.8 pg (ref 26.6–33.0)
MCHC: 33.8 g/dL (ref 31.5–35.7)
MCV: 88 fL (ref 79–97)
Monocytes Absolute: 0.6 10*3/uL (ref 0.1–0.9)
Monocytes: 7 %
Neutrophils Absolute: 4.4 10*3/uL (ref 1.4–7.0)
Neutrophils: 51 %
Platelets: 266 10*3/uL (ref 150–450)
RBC: 4.49 x10E6/uL (ref 4.14–5.80)
RDW: 12.4 % (ref 11.6–15.4)
WBC: 8.7 10*3/uL (ref 3.4–10.8)

## 2019-09-23 LAB — MICROALBUMIN / CREATININE URINE RATIO
Creatinine, Urine: 81.6 mg/dL
Microalb/Creat Ratio: 18 mg/g creat (ref 0–29)
Microalbumin, Urine: 14.7 ug/mL

## 2019-09-25 ENCOUNTER — Other Ambulatory Visit: Payer: Self-pay | Admitting: Medical

## 2019-09-25 NOTE — Telephone Encounter (Signed)
Referral has been sent.

## 2019-09-29 ENCOUNTER — Other Ambulatory Visit: Payer: Self-pay

## 2019-09-29 DIAGNOSIS — H9313 Tinnitus, bilateral: Secondary | ICD-10-CM

## 2019-10-01 ENCOUNTER — Other Ambulatory Visit: Payer: Self-pay | Admitting: Medical

## 2019-10-02 ENCOUNTER — Ambulatory Visit
Admission: RE | Admit: 2019-10-02 | Discharge: 2019-10-02 | Disposition: A | Discharge status: Home / Self Care | Payer: Medicare Other | Source: Ambulatory Visit | Attending: Medical | Admitting: Medical

## 2019-10-02 ENCOUNTER — Other Ambulatory Visit: Payer: Self-pay

## 2019-10-02 DIAGNOSIS — M5030 Other cervical disc degeneration, unspecified cervical region: Secondary | ICD-10-CM

## 2019-10-02 DIAGNOSIS — Z0000 Encounter for general adult medical examination without abnormal findings: Secondary | ICD-10-CM

## 2019-10-02 DIAGNOSIS — Z8781 Personal history of (healed) traumatic fracture: Secondary | ICD-10-CM

## 2019-10-02 DIAGNOSIS — M810 Age-related osteoporosis without current pathological fracture: Secondary | ICD-10-CM | POA: Diagnosis not present

## 2019-10-02 DIAGNOSIS — Z0389 Encounter for observation for other suspected diseases and conditions ruled out: Secondary | ICD-10-CM | POA: Diagnosis not present

## 2019-10-14 ENCOUNTER — Emergency Department (HOSPITAL_COMMUNITY)
Admission: EM | Admit: 2019-10-14 | Discharge: 2019-10-14 | Disposition: A | Discharge status: Home / Self Care | Payer: Medicare Other | Source: Home / Self Care | Attending: Emergency Medicine | Admitting: Emergency Medicine

## 2019-10-14 ENCOUNTER — Encounter (HOSPITAL_COMMUNITY): Payer: Self-pay | Admitting: Emergency Medicine

## 2019-10-14 ENCOUNTER — Emergency Department (HOSPITAL_COMMUNITY)
Admission: EM | Admit: 2019-10-14 | Discharge: 2019-10-14 | Disposition: A | Discharge status: Home / Self Care | Payer: Medicare Other | Attending: Emergency Medicine | Admitting: Emergency Medicine

## 2019-10-14 ENCOUNTER — Other Ambulatory Visit: Payer: Self-pay

## 2019-10-14 ENCOUNTER — Encounter (HOSPITAL_COMMUNITY): Payer: Self-pay

## 2019-10-14 ENCOUNTER — Emergency Department (HOSPITAL_COMMUNITY): Payer: Medicare Other

## 2019-10-14 DIAGNOSIS — M5020 Other cervical disc displacement, unspecified cervical region: Secondary | ICD-10-CM | POA: Insufficient documentation

## 2019-10-14 DIAGNOSIS — M47812 Spondylosis without myelopathy or radiculopathy, cervical region: Secondary | ICD-10-CM

## 2019-10-14 DIAGNOSIS — Z5321 Procedure and treatment not carried out due to patient leaving prior to being seen by health care provider: Secondary | ICD-10-CM | POA: Insufficient documentation

## 2019-10-14 DIAGNOSIS — M542 Cervicalgia: Secondary | ICD-10-CM | POA: Diagnosis not present

## 2019-10-14 DIAGNOSIS — Z79899 Other long term (current) drug therapy: Secondary | ICD-10-CM | POA: Insufficient documentation

## 2019-10-14 DIAGNOSIS — I10 Essential (primary) hypertension: Secondary | ICD-10-CM | POA: Insufficient documentation

## 2019-10-14 MED ORDER — PREDNISONE 20 MG PO TABS
20.0000 mg | ORAL_TABLET | Freq: Two times a day (BID) | ORAL | 0 refills | Status: DC
Start: 2019-10-14 — End: 2020-11-13

## 2019-10-14 MED ORDER — PREDNISONE 20 MG PO TABS
20.0000 mg | ORAL_TABLET | Freq: Once | ORAL | Status: AC
  Administered 2019-10-14: 20 mg via ORAL
  Filled 2019-10-14: qty 1

## 2019-10-14 NOTE — Discharge Instructions (Addendum)
We are prescribing prednisone to help for recovery from the arthritis and degenerative disc changes.  It will also help to use heat on the sore area 3-4 times a day for 30 minutes.  Additionally, using Tylenol 650 mg every 4 hours as beneficial for this type of pain.  Since he may require a type of intervention, we are referring him to a neurosurgeon, for assistance with care.  Call Dr. Ellene Route for the appointment.  He may prefer to evaluate things further with an MRI.  Additionally, his PCP could be helpful with care for this problem.

## 2019-10-14 NOTE — ED Provider Notes (Signed)
[place] DEPT Provider Note   CSN: [id number] Arrival date & time: 10/14/19  0409     History Chief Complaint  Patient presents with  . Neck Pain    Matthew Mckay is a 79 y.o. male.  HPI He presents for evaluation of neck pain, persistent since 2018 after a fall from a building.  He states he had initial imaging and saw a specialist, but he has pain which is ongoing, though intermittent.  He has increased pain with sleeping, exertion, and various activities.  He denies weakness, dizziness,, fever, chills, nausea or vomiting.  He is using symptomatic treatments without relief.  There are no other known modifying factors.    Past Medical History:  Diagnosis Date  . Allergy   . Anxiety   . Chronic sinusitis   . Diverticulosis    sigmoid colon and increased vascularization due to prior radiation, per 2013 colonoscopy  . GERD (gastroesophageal reflux disease)   . Hemorrhoid   . Hyperlipidemia   . Hypertension   . Prostate cancer [facility])    history of radiation therapy  . Seasonal allergies   . Urinary retention 2019    Patient Active Problem List   Diagnosis Date Noted  . Chronic constipation 09/22/2019  . Microalbuminuria 09/22/2019  . Impaired fasting blood sugar 09/22/2019  . 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 ([place]) 09/22/2019  . DDD (degenerative disc disease), cervical 09/22/2019  . Diverticulosis 09/22/2019  . Impacted cerumen of right ear 09/22/2019  . Hearing decreased, right 09/22/2019  . Frequent urination 01/20/2019  . History of compression fracture of spine 05/26/2018  . Corn of foot 05/26/2018  . Urinary retention 05/26/2018  . Tremor 05/26/2018  . Skin lesion 02/18/2017  . Hyperlipidemia 02/18/2017  . High risk medication use 09/24/2016  . Advanced directives, counseling/discussion 07/30/2016  . Allergic conjunctivitis, bilateral 07/30/2016  . 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 GERD  (gastroesophageal reflux disease) 08/08/2015  . Essential hypertension 02/12/2015  . Encounter for health maintenance examination in adult 02/12/2015  . Other allergic rhinitis 02/12/2015  . History of prostate cancer 02/12/2015  . Need for prophylactic vaccination against Streptococcus pneumoniae (pneumococcus) 02/12/2015  . Vaccine counseling 02/12/2015  . Need for influenza vaccination 03/27/2011    Past Surgical History:  Procedure Laterality Date  . COLONOSCOPY  02/2012   sigmoid diverticulosis, polyps, Dr. Clarene Essex  . CYSTOSCOPY WITH URETHRAL DILATATION N/A 02/10/2019   Procedure: CYSTOSCOPY WITH URETHRAL DILATATION,;  Surgeon: Alexis Frock, MD;  Location: [facility];  Service: Urology;  Laterality: N/A;  . PROSTATECTOMY     1990s       Family History  Problem Relation Age of Onset  . Cancer Brother        stomach  . Arthritis Brother   . Arthritis Brother   . Hypertension Brother   . Hypertension Brother   . Hypertension Mother   . Cancer Father        lung, smoked 2 ppd  . Cancer Paternal Uncle        cancer  . Cancer Paternal Uncle        stomach    Social History   Tobacco Use  . Smoking status: Never Smoker  . Smokeless tobacco: Never Used  Substance Use Topics  . Alcohol use: No  . Drug use: No    Home Medications Prior to Admission medications   Medication Sig Start Date End Date Taking? Authorizing Provider  atorvastatin (LIPITOR) 10 MG  tablet TAKE 1 TABLET BY MOUTH EVERY DAY 07/27/19   Tysinger, Camelia Eng, PA-C  famotidine (PEPCID) 40 MG tablet TAKE 1 TABLET BY MOUTH EVERY DAY 10/02/19   Tysinger, Camelia Eng, PA-C  fluticasone [facility]) 50 MCG/ACT nasal spray SPRAY 2 SPRAYS INTO EACH NOSTRIL EVERY DAY 09/14/19   Tysinger, Camelia Eng, PA-C  levocetirizine (XYZAL) 5 MG tablet TAKE 1 TABLET BY MOUTH EVERY DAY IN THE EVENING 07/31/19   Tysinger, Camelia Eng, PA-C  lisinopril-hydrochlorothiazide (ZESTORETIC) 20-12.5 MG tablet TAKE 1 TABLET BY MOUTH EVERY DAY 09/06/19    Tysinger, Camelia Eng, PA-C  meclizine (ANTIVERT) 25 MG tablet Take 1 tablet (25 mg total) by mouth 2 (two) times daily. 06/22/19   Tysinger, Camelia Eng, PA-C  olopatadine (PATANOL) 0.1 % ophthalmic solution Place 1 drop into both eyes 2 (two) times daily. 06/22/19   Tysinger, Camelia Eng, PA-C  omeprazole (PRILOSEC) 40 MG capsule TAKE 1 CAPSULE BY MOUTH EVERY DAY 08/17/19   Tysinger, Camelia Eng, PA-C  polyethylene glycol powder (GLYCOLAX/MIRALAX) powder 1 capfull daily in beverage Patient not taking: Reported on 09/22/2019 05/26/18   Tysinger, Camelia Eng, PA-C  polyvinyl alcohol (LIQUIFILM TEARS) 1.4 % ophthalmic solution Place 1 drop into both eyes as needed for dry eyes.    [provider]  traMADol (ULTRAM) 50 MG tablet Take 1-2 tablets (50-100 mg total) by mouth every 6 (six) hours as needed for moderate pain or severe pain. Post-operatively 02/10/19 02/10/20  Alexis Frock, MD  verapamil (CALAN-SR) 240 MG CR tablet TAKE 1 TABLET (240 MG TOTAL) BY MOUTH AT BEDTIME. 10/17/18   Tysinger, Camelia Eng, PA-C    Allergies    Patient has no known allergies.  Review of Systems   Review of Systems  All other systems reviewed and are negative.   Physical Exam Updated Vital Signs BP (!) 185/100 (BP Location: Left Arm)   Pulse 73   Temp 98.2 F (36.8 C) (Oral)   Resp 16   Ht 5\' 9"  (1.753 m)   Wt 81.6 kg   SpO2 97%   BMI 26.58 kg/m   Physical Exam Vitals and nursing note reviewed.  Constitutional:      General: He is not in acute distress.    Appearance: He is well-developed. He is not ill-appearing, toxic-appearing or diaphoretic.  HENT:     Head: Normocephalic and atraumatic.     Right Ear: External ear normal.     Left Ear: External ear normal.  Eyes:     Conjunctiva/sclera: Conjunctivae normal.     Pupils: Pupils are equal, round, and reactive to light.  Neck:     Trachea: Phonation normal.  Cardiovascular:     Rate and Rhythm: Normal rate.  Pulmonary:     Effort: Pulmonary effort is  normal.  Abdominal:     General: There is no distension.  Musculoskeletal:     Cervical back: Normal range of motion and neck supple.     Comments: Diminished motion of the cervical spine, flexion, extension and lateral bending bilaterally.  Skin:    General: Skin is warm and dry.  Neurological:     Mental Status: He is alert and oriented to person, place, and time.     Cranial Nerves: No cranial nerve deficit.     Sensory: No sensory deficit.     Motor: No abnormal muscle tone.     Coordination: Coordination normal.  Psychiatric:        Mood and Affect: Mood normal.  Behavior: Behavior normal.        Thought Content: Thought content normal.        Judgment: Judgment normal.     ED Results / Procedures / Treatments   Labs (all labs ordered are listed, but only abnormal results are displayed) Labs Reviewed - No data to display  EKG None  Radiology CT Cervical Spine Wo Contrast  Result Date: 10/14/2019 CLINICAL DATA:  Chronic neck pain.  Worsening pain. EXAM: CT CERVICAL SPINE WITHOUT CONTRAST TECHNIQUE: Multidetector CT imaging of the cervical spine was performed without intravenous contrast. Multiplanar CT image reconstructions were also generated. COMPARISON:  CT cervical spine 09/21/2016 FINDINGS: Alignment: 2 mm retrolisthesis C3-4 unchanged. Remaining alignment normal. Mild to moderate cervical kyphosis. Skull base and vertebrae: Negative for fracture or mass. Soft tissues and spinal canal: No mass or adenopathy in the neck. Disc levels: C2-3: Mild right foraminal narrowing due to uncinate spurring and facet hypertrophy. C3-4: 2 mm retrolisthesis. Disc and facet degeneration with spurring. Mild foraminal narrowing bilaterally. There is a moderately large central disc protrusion which has progressed in the interval. There is progressive spinal stenosis since the prior study. C4-5: Disc degeneration with diffuse uncinate spurring. Small central disc protrusion unchanged. Mild  facet degeneration. Mild left foraminal narrowing and mild spinal stenosis. No significant interval change. C5-6: Disc degeneration with diffuse uncinate spurring. There is mild spinal stenosis. Severe left foraminal stenosis and moderate right foraminal stenosis due to spurring is unchanged. C6-7: Disc degeneration with diffuse uncinate spurring. Mild spinal stenosis and moderate foraminal stenosis bilaterally due to spurring. C7-T1: Mild disc and facet degeneration.  Negative for stenosis. Upper chest: Lung apices clear bilaterally. Other: None IMPRESSION: 1. No acute abnormality in the cervical spine. 2. Multilevel degenerative change in the cervical spine. There is progressive central disc protrusion at C3-4 with progressive spinal stenosis compared to the prior study. This extends to the left of midline. Remaining degenerative changes are stable. Electronically Signed   By: Franchot Gallo M.D.   On: 10/14/2019 10:05    Procedures Procedures (including critical care time)  Medications Ordered in ED Medications  predniSONE (DELTASONE) tablet 20 mg (has no administration in time range)    ED Course  I have reviewed the triage vital signs and the nursing notes.  Pertinent labs & imaging results that were available during my care of the patient were reviewed by me and considered in my medical decision making (see chart for details).    MDM Rules/Calculators/A&P                           Patient Vitals for the past 24 hrs:  BP Temp Temp src Pulse Resp SpO2 Height Weight  10/14/19 0531 -- -- -- -- -- -- 5\' 9"  (1.753 m) 81.6 kg  10/14/19 0512 (!) 185/100 98.2 F (36.8 C) Oral 73 16 97 % 5\' 9"  (1.753 m) 81.6 kg    11:11 AM Reevaluation with update and discussion. After initial assessment and treatment, an updated evaluation reveals he remains stable and comfortable.  Ambulating normally.  Findings discussed with daughter in room as well as a daughter-in-law on the telephone.  All questions  answered. Daleen Bo   Medical Decision Making:  This patient is presenting for evaluation of chronic neck pain status post injury several years ago, which does require a range of treatment options, and is a complaint that involves a moderate risk of morbidity and mortality. The differential diagnoses  include muscle strain, cervical radiculopathy, cervical myelopathy, fracture. I decided to review old records, and in summary elderly male with prior injury and ongoing neck pain.  I did got additional historical information from his daughter in the room.   Radiologic Tests Ordered, included CT cervical spine.  I independently Visualized: CT images, which show degenerative joint disease, spinal stenosis L3-4, with HNP.    Critical Interventions-clinical evaluation, CT imaging, observation reassessment  After These Interventions, the Patient was reevaluated and was found stable for discharge.  Pain likely related to combination of degenerative changes and progressive disease with herniated disc also associated with degeneration.  Doubt spinal myelopathy, fracture, infectious process causing symptoms.  Plan referral to neurosurgery follow-up possible additional evaluation and treatment.  CRITICAL CARE-no Performed by: Daleen Bo  Nursing Notes Reviewed/ Care Coordinated Applicable Imaging Reviewed Interpretation of Laboratory Data incorporated into ED treatment  The patient appears reasonably screened and/or stabilized for discharge and I doubt any other medical condition or other [facility] requiring further screening, evaluation, or treatment in the ED at this time prior to discharge.  Plan: Home Medications-continue current medication and use Tylenol for pain regularly; Home Treatments-heat to affected area; return here if the recommended treatment, does not improve the symptoms; Recommended follow up-PCP, as needed.  Follow-up with neurosurgery as soon as possible.     Final Clinical  Impression(s) / ED Diagnoses Final diagnoses:  Herniated disc, cervical  Neck pain  Spondylosis of cervical region without myelopathy or radiculopathy    Rx / DC Orders ED Discharge Orders    None       Daleen Bo, MD 10/14/19 1150

## 2019-10-14 NOTE — ED Triage Notes (Signed)
Pt presents to ED POv. Pt c/o neck pain. Pt reports that this is a chronic issue but this is worst ever. Pt says usually chiropractor typically helps with it. Pt reports increased exertion.

## 2019-10-14 NOTE — ED Triage Notes (Signed)
Pt sts chronic neck pain since falling off of a room years ago. Pt sts sharp pain in left side of neck and unable to turn his head.

## 2019-10-16 DIAGNOSIS — N3000 Acute cystitis without hematuria: Secondary | ICD-10-CM | POA: Diagnosis not present

## 2019-10-16 DIAGNOSIS — R3915 Urgency of urination: Secondary | ICD-10-CM | POA: Diagnosis not present

## 2019-10-16 DIAGNOSIS — N320 Bladder-neck obstruction: Secondary | ICD-10-CM | POA: Diagnosis not present

## 2019-10-20 ENCOUNTER — Other Ambulatory Visit: Payer: Self-pay | Admitting: Medical

## 2019-10-21 ENCOUNTER — Other Ambulatory Visit: Payer: Self-pay | Admitting: Medical

## 2019-11-04 ENCOUNTER — Other Ambulatory Visit: Payer: Self-pay | Admitting: Medical

## 2019-11-07 DIAGNOSIS — J301 Allergic rhinitis due to pollen: Secondary | ICD-10-CM | POA: Insufficient documentation

## 2019-11-07 DIAGNOSIS — H903 Sensorineural hearing loss, bilateral: Secondary | ICD-10-CM | POA: Diagnosis not present

## 2019-11-07 DIAGNOSIS — H9313 Tinnitus, bilateral: Secondary | ICD-10-CM | POA: Diagnosis not present

## 2019-11-07 DIAGNOSIS — H9113 Presbycusis, bilateral: Secondary | ICD-10-CM | POA: Diagnosis not present

## 2019-11-10 DIAGNOSIS — H524 Presbyopia: Secondary | ICD-10-CM | POA: Diagnosis not present

## 2019-11-10 DIAGNOSIS — H0101A Ulcerative blepharitis right eye, upper and lower eyelids: Secondary | ICD-10-CM | POA: Diagnosis not present

## 2019-11-10 DIAGNOSIS — H02112 Cicatricial ectropion of right lower eyelid: Secondary | ICD-10-CM | POA: Diagnosis not present

## 2019-11-10 DIAGNOSIS — H0101B Ulcerative blepharitis left eye, upper and lower eyelids: Secondary | ICD-10-CM | POA: Diagnosis not present

## 2019-11-10 DIAGNOSIS — H2513 Age-related nuclear cataract, bilateral: Secondary | ICD-10-CM | POA: Diagnosis not present

## 2019-11-23 ENCOUNTER — Other Ambulatory Visit: Payer: Self-pay | Admitting: Medical

## 2019-12-05 ENCOUNTER — Other Ambulatory Visit: Payer: Self-pay | Admitting: Medical

## 2019-12-13 ENCOUNTER — Emergency Department (HOSPITAL_COMMUNITY)
Admission: EM | Admit: 2019-12-13 | Discharge: 2019-12-13 | Disposition: A | Discharge status: Home / Self Care | Payer: Medicare Other | Attending: Emergency Medicine | Admitting: Emergency Medicine

## 2019-12-13 ENCOUNTER — Other Ambulatory Visit: Payer: Self-pay

## 2019-12-13 ENCOUNTER — Encounter (HOSPITAL_COMMUNITY): Payer: Self-pay | Admitting: Emergency Medicine

## 2019-12-13 DIAGNOSIS — Z79899 Other long term (current) drug therapy: Secondary | ICD-10-CM | POA: Insufficient documentation

## 2019-12-13 DIAGNOSIS — R55 Syncope and collapse: Secondary | ICD-10-CM

## 2019-12-13 DIAGNOSIS — R6889 Other general symptoms and signs: Secondary | ICD-10-CM | POA: Diagnosis not present

## 2019-12-13 DIAGNOSIS — I2510 Atherosclerotic heart disease of native coronary artery without angina pectoris: Secondary | ICD-10-CM | POA: Diagnosis not present

## 2019-12-13 DIAGNOSIS — R42 Dizziness and giddiness: Secondary | ICD-10-CM | POA: Diagnosis not present

## 2019-12-13 DIAGNOSIS — I10 Essential (primary) hypertension: Secondary | ICD-10-CM | POA: Diagnosis not present

## 2019-12-13 DIAGNOSIS — T671XXA Heat syncope, initial encounter: Secondary | ICD-10-CM | POA: Diagnosis not present

## 2019-12-13 DIAGNOSIS — T675XXA Heat exhaustion, unspecified, initial encounter: Secondary | ICD-10-CM | POA: Diagnosis not present

## 2019-12-13 DIAGNOSIS — Z743 Need for continuous supervision: Secondary | ICD-10-CM | POA: Diagnosis not present

## 2019-12-13 DIAGNOSIS — E860 Dehydration: Secondary | ICD-10-CM

## 2019-12-13 DIAGNOSIS — R0902 Hypoxemia: Secondary | ICD-10-CM | POA: Diagnosis not present

## 2019-12-13 DIAGNOSIS — T699XXA Effect of reduced temperature, unspecified, initial encounter: Secondary | ICD-10-CM | POA: Diagnosis not present

## 2019-12-13 LAB — URINALYSIS, ROUTINE W REFLEX MICROSCOPIC
Bilirubin Urine: NEGATIVE
Glucose, UA: NEGATIVE mg/dL
Hgb urine dipstick: NEGATIVE
Ketones, ur: NEGATIVE mg/dL
Nitrite: NEGATIVE
Protein, ur: NEGATIVE mg/dL
Specific Gravity, Urine: 1.014 (ref 1.005–1.030)
pH: 5 (ref 5.0–8.0)

## 2019-12-13 LAB — COMPREHENSIVE METABOLIC PANEL
ALT: 14 U/L (ref 0–44)
AST: 21 U/L (ref 15–41)
Albumin: 3.6 g/dL (ref 3.5–5.0)
Alkaline Phosphatase: 51 U/L (ref 38–126)
Anion gap: 13 (ref 5–15)
BUN: 26 mg/dL — ABNORMAL HIGH (ref 8–23)
CO2: 21 mmol/L — ABNORMAL LOW (ref 22–32)
Calcium: 9.2 mg/dL (ref 8.9–10.3)
Chloride: 108 mmol/L (ref 98–111)
Creatinine, Ser: 1.34 mg/dL — ABNORMAL HIGH (ref 0.61–1.24)
GFR calc Af Amer: 57 mL/min — ABNORMAL LOW (ref >60)
GFR calc non Af Amer: 49 mL/min — ABNORMAL LOW (ref >60)
Glucose, Bld: 148 mg/dL — ABNORMAL HIGH (ref 70–99)
Potassium: 3.7 mmol/L (ref 3.5–5.1)
Sodium: 142 mmol/L (ref 135–145)
Total Bilirubin: 0.4 mg/dL (ref 0.3–1.2)
Total Protein: 6.7 g/dL (ref 6.5–8.1)

## 2019-12-13 LAB — CBC
HCT: 44.1 % (ref 39.0–52.0)
Hemoglobin: 14 g/dL (ref 13.0–17.0)
MCH: 30 pg (ref 26.0–34.0)
MCHC: 31.7 g/dL (ref 30.0–36.0)
MCV: 94.4 fL (ref 80.0–100.0)
Platelets: 226 10*3/uL (ref 150–400)
RBC: 4.67 MIL/uL (ref 4.22–5.81)
RDW: 12.8 % (ref 11.5–15.5)
WBC: 12.8 10*3/uL — ABNORMAL HIGH (ref 4.0–10.5)
nRBC: 0 % (ref 0.0–0.2)

## 2019-12-13 LAB — LIPASE, BLOOD: Lipase: 32 U/L (ref 11–51)

## 2019-12-13 MED ORDER — LACTATED RINGERS IV BOLUS
1000.0000 mL | Freq: Once | INTRAVENOUS | Status: AC
  Administered 2019-12-13: 1000 mL via INTRAVENOUS

## 2019-12-13 MED ORDER — ONDANSETRON 4 MG PO TBDP
ORAL_TABLET | ORAL | Status: AC
  Administered 2019-12-13: 4 mg via ORAL
  Filled 2019-12-13: qty 1

## 2019-12-13 MED ORDER — ONDANSETRON 4 MG PO TBDP: 4.0000 mg | ORAL_TABLET | Freq: Once | ORAL | Status: AC | PRN

## 2019-12-13 NOTE — Discharge Instructions (Addendum)
Drink plenty of fluids.  You can take colace or your preferred stool softener, available over the counter, if you develop constipation.

## 2019-12-13 NOTE — ED Provider Notes (Signed)
[place] EMERGENCY DEPARTMENT Provider Note   CSN: [id number] Arrival date & time: 12/13/19  1242     History Chief Complaint  Patient presents with  . Near Syncope    Matthew Mckay is a 79 y.o. male.  The history is provided by the patient and medical records. No language interpreter was used.   Matthew Mckay is a 79 y.o. male who presents to the Emergency Department complaining of near syncope. He presents the emergency department for evaluation following a near syncopal event that occurred earlier today. 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 Today he went to the bathroom to attempt to have a bowel movement and was unsuccessful. He felt week after attempting to have a bowel movement and attempted to get up. He walked outside and felt very weak as if he might pass out when he was assisted to the ground. EMS arrived report that he was diaphoretic with the blood pressure 55 or 42 and a heart rate of 58. He did have an episode of stool incontinence as well as nausea and vomiting. His blood pressure improved after IV fluid administration prior to ED arrival. He has experienced multiple bowel movements that are nonbloody, nonblack since ED arrival. He describes him as a loose stools. He denies any headache, chest pain, abdominal pain. He does have a history of hypertension, hyperlipidemia, allergies.    Past Medical History:  Diagnosis Date  . Allergy   . Anxiety   . Chronic sinusitis   . Diverticulosis    sigmoid colon and increased vascularization due to prior radiation, per 2013 colonoscopy  . GERD (gastroesophageal reflux disease)   . Hemorrhoid   . Hyperlipidemia   . Hypertension   . Prostate cancer [facility])    history of radiation therapy  . Seasonal allergies   . Urinary retention 2019    Patient Active Problem List   Diagnosis Date Noted  . Chronic constipation 09/22/2019  . Microalbuminuria 09/22/2019  . Impaired fasting blood sugar  09/22/2019  . 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 ([place]) 09/22/2019  . DDD (degenerative disc disease), cervical 09/22/2019  . Diverticulosis 09/22/2019  . Impacted cerumen of right ear 09/22/2019  . Hearing decreased, right 09/22/2019  . Frequent urination 01/20/2019  . History of compression fracture of spine 05/26/2018  . Corn of foot 05/26/2018  . Urinary retention 05/26/2018  . Tremor 05/26/2018  . Skin lesion 02/18/2017  . Hyperlipidemia 02/18/2017  . High risk medication use 09/24/2016  . Advanced directives, counseling/discussion 07/30/2016  . Allergic conjunctivitis, bilateral 07/30/2016  . 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 GERD (gastroesophageal reflux disease) 08/08/2015  . Essential hypertension 02/12/2015  . Encounter for health maintenance examination in adult 02/12/2015  . Other allergic rhinitis 02/12/2015  . History of prostate cancer 02/12/2015  . Need for prophylactic vaccination against Streptococcus pneumoniae (pneumococcus) 02/12/2015  . Vaccine counseling 02/12/2015  . Need for influenza vaccination 03/27/2011    Past Surgical History:  Procedure Laterality Date  . COLONOSCOPY  02/2012   sigmoid diverticulosis, polyps, Dr. Clarene Essex  . CYSTOSCOPY WITH URETHRAL DILATATION N/A 02/10/2019   Procedure: CYSTOSCOPY WITH URETHRAL DILATATION,;  Surgeon: Alexis Frock, MD;  Location: [facility];  Service: Urology;  Laterality: N/A;  . PROSTATECTOMY     1990s       Family History  Problem Relation Age of Onset  . Cancer Brother        stomach  . Arthritis Brother   .  Arthritis Brother   . Hypertension Brother   . Hypertension Brother   . Hypertension Mother   . Cancer Father        lung, smoked 2 ppd  . Cancer Paternal Uncle        cancer  . Cancer Paternal Uncle        stomach    Social History   Tobacco Use  . Smoking status: Never Smoker  . Smokeless tobacco: Never Used  Substance Use Topics  . Alcohol use: No  .  Drug use: No    Home Medications Prior to Admission medications   Medication Sig Start Date End Date Taking? Authorizing Provider  atorvastatin (LIPITOR) 10 MG tablet TAKE 1 TABLET BY MOUTH EVERY DAY 10/20/19   Tysinger, Camelia Eng, PA-C  famotidine (PEPCID) 40 MG tablet TAKE 1 TABLET BY MOUTH EVERY DAY 12/05/19   Tysinger, Camelia Eng, PA-C  fluticasone [facility]) 50 MCG/ACT nasal spray SPRAY 2 SPRAYS INTO EACH NOSTRIL EVERY DAY 09/14/19   Tysinger, Camelia Eng, PA-C  levocetirizine (XYZAL) 5 MG tablet TAKE 1 TABLET BY MOUTH EVERY DAY IN THE EVENING 07/31/19   Tysinger, Camelia Eng, PA-C  lisinopril-hydrochlorothiazide (ZESTORETIC) 20-12.5 MG tablet TAKE 1 TABLET BY MOUTH EVERY DAY 09/06/19   Tysinger, Camelia Eng, PA-C  meclizine (ANTIVERT) 25 MG tablet Take 1 tablet (25 mg total) by mouth 2 (two) times daily. 06/22/19   Tysinger, Camelia Eng, PA-C  olopatadine (PATANOL) 0.1 % ophthalmic solution Place 1 drop into both eyes 2 (two) times daily. 06/22/19   Tysinger, Camelia Eng, PA-C  omeprazole (PRILOSEC) 40 MG capsule TAKE 1 CAPSULE BY MOUTH EVERY DAY 08/17/19   Tysinger, Camelia Eng, PA-C  polyethylene glycol powder (GLYCOLAX/MIRALAX) powder 1 capfull daily in beverage Patient not taking: Reported on 09/22/2019 05/26/18   Tysinger, Camelia Eng, PA-C  polyvinyl alcohol (LIQUIFILM TEARS) 1.4 % ophthalmic solution Place 1 drop into both eyes as needed for dry eyes.    [provider]  predniSONE (DELTASONE) 20 MG tablet Take 1 tablet (20 mg total) by mouth 2 (two) times daily. 10/14/19   Daleen Bo, MD  traMADol (ULTRAM) 50 MG tablet Take 1-2 tablets (50-100 mg total) by mouth every 6 (six) hours as needed for moderate pain or severe pain. Post-operatively 02/10/19 02/10/20  Alexis Frock, MD  verapamil (CALAN-SR) 240 MG CR tablet TAKE 1 TABLET (240 MG TOTAL) BY MOUTH AT BEDTIME. 10/24/19   Tysinger, Camelia Eng, PA-C    Allergies    Patient has no known allergies.  Review of Systems   Review of Systems  All other systems reviewed  and are negative.   Physical Exam Updated Vital Signs BP (!) 157/81   Pulse 72   Temp 97.6 F (36.4 C) (Oral)   Resp 18   Wt 80 kg   SpO2 100%   BMI 26.05 kg/m   Physical Exam Vitals and nursing note reviewed.  Constitutional:      Appearance: He is well-developed.  HENT:     Head: Normocephalic and atraumatic.  Cardiovascular:     Rate and Rhythm: Normal rate and regular rhythm.     Heart sounds: No murmur heard.   Pulmonary:     Effort: Pulmonary effort is normal. No respiratory distress.     Breath sounds: Normal breath sounds.  Abdominal:     Palpations: Abdomen is soft.     Tenderness: There is no abdominal tenderness. There is no guarding or rebound.  Musculoskeletal:  General: No swelling or tenderness.  Skin:    General: Skin is warm and dry.  Neurological:     Mental Status: He is alert and oriented to person, place, and time.  Psychiatric:        Behavior: Behavior normal.     ED Results / Procedures / Treatments   Labs (all labs ordered are listed, but only abnormal results are displayed) Labs Reviewed  CBC - Abnormal; Notable for the following components:      Result Value   WBC 12.8 (*)    All other components within normal limits  URINALYSIS, ROUTINE W REFLEX MICROSCOPIC - Abnormal; Notable for the following components:   Leukocytes,Ua TRACE (*)    Bacteria, UA RARE (*)    All other components within normal limits  COMPREHENSIVE METABOLIC PANEL - Abnormal; Notable for the following components:   CO2 21 (*)    Glucose, Bld 148 (*)    BUN 26 (*)    Creatinine, Ser 1.34 (*)    GFR calc non Af Amer 49 (*)    GFR calc Af Amer 57 (*)    All other components within normal limits  LIPASE, BLOOD    EKG EKG Interpretation  Date/Time:  Wednesday December 13 2019 12:52:15 EDT Ventricular Rate:  55 PR Interval:  166 QRS Duration: 102 QT Interval:  470 QTC Calculation: 449 R Axis:   17 Text Interpretation: Sinus bradycardia with sinus  arrhythmia Incomplete right bundle branch block Borderline ECG Confirmed by Quintella Reichert [phone]) on 12/13/2019 2:26:54 PM   Radiology No results found.  Procedures Procedures (including critical care time)  Medications Ordered in ED Medications  ondansetron (ZOFRAN-ODT) disintegrating tablet 4 mg (4 mg Oral Given 12/13/19 1300)  lactated ringers bolus 1,000 mL (0 mLs Intravenous Stopped 12/13/19 1633)    ED Course  I have reviewed the triage vital signs and the nursing notes.  Pertinent labs & imaging results that were available during my care of the patient were reviewed by me and considered in my medical decision making (see chart for details).    MDM Rules/Calculators/A&P                         Patient with history of hypertension here for evaluation following near syncopal event after attempting to have a bowel movement as well as working in the heat. On evaluation he is feeling improved. He has had a few nonbloody bowel movements during his ED stay. He is well appearing on evaluation. Presentation is not consistent with ACS, life-threatening arrhythmia, PE, AAA. Labs with mild elevation and creatinine compared to baseline, favor element of dehydration. He was treated with IV fluids. He is not orthostatic. Plan to discharge home with outpatient resources and return precautions.  Final Clinical Impression(s) / ED Diagnoses Final diagnoses:  Near syncope  Dehydration  Heat syncope, initial encounter  Vaso vagal episode    Rx / DC Orders ED Discharge Orders    None       Quintella Reichert, MD 12/13/19 1715

## 2019-12-13 NOTE — ED Triage Notes (Addendum)
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 Patient has an episode of incontinence of stool with nausea and emesis. Ax4 with EMS and upon arrival. Administered 0.9 NS 566ml repeat BP 98/52 HR 62. IV left FA 18G.

## 2020-01-03 ENCOUNTER — Other Ambulatory Visit: Payer: Self-pay | Admitting: Medical

## 2020-02-03 ENCOUNTER — Other Ambulatory Visit: Payer: Self-pay | Admitting: Medical

## 2020-02-08 ENCOUNTER — Other Ambulatory Visit: Payer: Self-pay

## 2020-02-08 ENCOUNTER — Other Ambulatory Visit (INDEPENDENT_AMBULATORY_CARE_PROVIDER_SITE_OTHER): Payer: Medicare Other

## 2020-02-08 DIAGNOSIS — Z23 Encounter for immunization: Secondary | ICD-10-CM | POA: Diagnosis not present

## 2020-02-09 ENCOUNTER — Other Ambulatory Visit: Payer: Self-pay | Admitting: Medical

## 2020-02-15 ENCOUNTER — Other Ambulatory Visit: Payer: Self-pay | Admitting: Medical

## 2020-03-05 ENCOUNTER — Other Ambulatory Visit: Payer: Self-pay | Admitting: Medical

## 2020-03-10 ENCOUNTER — Other Ambulatory Visit: Payer: Self-pay | Admitting: Medical

## 2020-03-24 ENCOUNTER — Other Ambulatory Visit: Payer: Self-pay | Admitting: Medical

## 2020-04-05 ENCOUNTER — Encounter: Payer: Self-pay | Admitting: Medical

## 2020-04-05 ENCOUNTER — Other Ambulatory Visit: Payer: Self-pay

## 2020-04-05 ENCOUNTER — Telehealth (INDEPENDENT_AMBULATORY_CARE_PROVIDER_SITE_OTHER): Payer: Medicare Other | Admitting: Medical

## 2020-04-05 VITALS — Ht 69.0 in | Wt 180.0 lb

## 2020-04-05 DIAGNOSIS — R32 Unspecified urinary incontinence: Secondary | ICD-10-CM | POA: Insufficient documentation

## 2020-04-05 DIAGNOSIS — N3000 Acute cystitis without hematuria: Secondary | ICD-10-CM | POA: Diagnosis not present

## 2020-04-05 DIAGNOSIS — R300 Dysuria: Secondary | ICD-10-CM | POA: Diagnosis not present

## 2020-04-05 DIAGNOSIS — N481 Balanitis: Secondary | ICD-10-CM

## 2020-04-05 DIAGNOSIS — J0110 Acute frontal sinusitis, unspecified: Secondary | ICD-10-CM | POA: Diagnosis not present

## 2020-04-05 MED ORDER — NYSTATIN 100000 UNIT/GM EX CREA: 1.0000 "application " | TOPICAL_CREAM | Freq: Two times a day (BID) | CUTANEOUS | 1 refills | Status: DC

## 2020-04-05 MED ORDER — SULFAMETHOXAZOLE-TRIMETHOPRIM 800-160 MG PO TABS: 1.0000 | ORAL_TABLET | Freq: Two times a day (BID) | ORAL | 0 refills | Status: DC

## 2020-04-05 MED ORDER — GUAIFENESIN ER 600 MG PO TB12: 600.0000 mg | ORAL_TABLET | Freq: Two times a day (BID) | ORAL | 0 refills | Status: DC

## 2020-04-05 NOTE — Progress Notes (Signed)
Subjective:     Patient ID: Matthew Mckay, male   DOB: October 15, 1940, 79 y.o.   MRN: [id number]  This visit type was conducted due to national recommendations for restrictions regarding the COVID-19 Pandemic (e.g. social distancing) in an effort to limit this patient's exposure and mitigate transmission in our community.  Due to their co-morbid illnesses, this patient is at least at moderate risk for complications without adequate follow up.  This format is felt to be most appropriate for this patient at this time.    Documentation for virtual audio and video telecommunications through [place] encounter:  The patient was located at home. The provider was located in the office. The patient did consent to this visit and is aware of possible charges through their insurance for this visit.  The other persons participating in this telemedicine service were none. Time spent on call was 20 minutes and in review of previous records 20 minutes total.  This virtual service is not related to other E/M service within previous 7 days.   HPI Chief Complaint  Patient presents with  . URI    Congestion, throat drainage  . Urinary Tract Infection    Penis hurts, burning with urination, frequent urination    Virtual consult today for 2 issues.  He notes 2-week history of sinus pressure, congested, clearing the throat, drainage, not improving.  Several coworkers have had head colds.  Most of his contacts have been fully vaccinated and he has been vaccinated as well to Covid.  He got a flu shot back in October.  No recent change in taste or smell, no fever, body aches or chills, no vomiting nausea back pain or lower belly pain.  He also notes a few days of burning with urination, increased frequency of urination, having to wear a diaper temporarily due to some leakage and fear of having an accident.  No odor or cloudy urine but more irritation when he urinates.  Occasionally gets redness at the foreskin  but not currently.  No other aggravating or relieving factors. No other complaint.   Review of Systems As in subjective    Objective:   Physical Exam Due to coronavirus pandemic stay at home measures, patient visit was virtual and they were not examined in person.   Ht 5\' 9"  (1.753 m)   Wt 180 lb (81.6 kg)   BMI 26.58 kg/m   Pleasant, answers questions appropriate, no obvious pain, sounds congested    Assessment:     Encounter Diagnoses  Name Primary?  . Dysuria Yes  . Acute non-recurrent frontal sinusitis   . Acute cystitis without hematuria   . Burning with urination   . Balanitis   . Urinary incontinence, unspecified type        Plan:     We will use Bactrim for both sinusitis and possible urinary tract infection.  We discussed rest, hydration, Mucinex over-the-counter, Tylenol as needed, nasal saline flushing continue routine medicines.  Advise he do not duplicate antihistamines though.  He does have more than 1 listed in the chart but he uses on for different things depending on if he has vertigo or not.  If worse, not improving, fever body aches chills or new symptoms of the weekend call or get reevaluated.  I prescribed nystatin cream for occasional balanitis that he has gotten in the past with some urinary leakage  Matthew Mckay was seen today for uri and urinary tract infection.  Diagnoses and all orders for this visit:  Dysuria  Acute non-recurrent frontal sinusitis  Acute cystitis without hematuria  Burning with urination  Balanitis  Urinary incontinence, unspecified type  Other orders -     sulfamethoxazole-trimethoprim (BACTRIM DS) 800-160 MG tablet; Take 1 tablet by mouth 2 (two) times daily. -     nystatin cream (MYCOSTATIN); Apply 1 application topically 2 (two) times daily. -     guaiFENesin (MUCINEX) 600 MG 12 hr tablet; Take 1 tablet (600 mg total) by mouth 2 (two) times daily.   F/u prn

## 2020-04-09 ENCOUNTER — Other Ambulatory Visit: Payer: Self-pay

## 2020-04-09 ENCOUNTER — Ambulatory Visit (INDEPENDENT_AMBULATORY_CARE_PROVIDER_SITE_OTHER): Payer: Medicare Other | Admitting: Family Medicine

## 2020-04-09 VITALS — BP 132/76 | HR 67 | Temp 98.7°F | Wt 177.6 lb

## 2020-04-09 DIAGNOSIS — S60945A Unspecified superficial injury of left ring finger, initial encounter: Secondary | ICD-10-CM | POA: Diagnosis not present

## 2020-04-09 DIAGNOSIS — W5501XA Bitten by cat, initial encounter: Secondary | ICD-10-CM

## 2020-04-09 MED ORDER — TETANUS-DIPHTHERIA TOXOIDS TD 2-2 LF/0.5ML IM SUSP: 0.5000 mL | Freq: Once | INTRAMUSCULAR | 0 refills | Status: DC

## 2020-04-09 NOTE — Progress Notes (Signed)
   Subjective:    Patient ID: Matthew Mckay, male    DOB: [birth year], 79 y.o.   MRN: [id number]  HPI He states feral cats in the neighborhood.  He was bitten by 1 on the left third fingertip yesterday.  Today he has minimal tenderness.    Review of Systems     Objective:   Physical Exam Exam of the left third fingertip does show evidence of recent bites.  It is not red hot or tender.       Assessment & Plan:  Cat bite, initial encounter - Plan: diptheria-tetanus toxoids [facility]) 2-2 LF/0.5ML injection I explained that we need to be very careful with this and the potential for a problem.  Instructed him to call me if it gets hot red or tender.

## 2020-04-09 NOTE — Addendum Note (Signed)
Addended by: Elyse Jarvis on: 04/09/2020 02:54 PM   Modules accepted: Orders

## 2020-04-11 ENCOUNTER — Other Ambulatory Visit: Payer: Self-pay

## 2020-04-11 ENCOUNTER — Ambulatory Visit (INDEPENDENT_AMBULATORY_CARE_PROVIDER_SITE_OTHER): Payer: Medicare Other

## 2020-04-11 DIAGNOSIS — Z23 Encounter for immunization: Secondary | ICD-10-CM

## 2020-04-20 ENCOUNTER — Other Ambulatory Visit: Payer: Self-pay | Admitting: Medical

## 2020-05-13 ENCOUNTER — Other Ambulatory Visit: Payer: Self-pay | Admitting: Medical

## 2020-05-16 ENCOUNTER — Encounter: Payer: Self-pay | Admitting: Family Medicine

## 2020-05-16 ENCOUNTER — Other Ambulatory Visit: Payer: Self-pay

## 2020-05-16 ENCOUNTER — Ambulatory Visit (INDEPENDENT_AMBULATORY_CARE_PROVIDER_SITE_OTHER): Payer: Medicare Other | Admitting: Family Medicine

## 2020-05-16 VITALS — BP 128/72 | HR 61 | Temp 98.1°F | Wt 177.8 lb

## 2020-05-16 DIAGNOSIS — R399 Unspecified symptoms and signs involving the genitourinary system: Secondary | ICD-10-CM | POA: Diagnosis not present

## 2020-05-16 DIAGNOSIS — Z87448 Personal history of other diseases of urinary system: Secondary | ICD-10-CM | POA: Diagnosis not present

## 2020-05-16 DIAGNOSIS — R300 Dysuria: Secondary | ICD-10-CM

## 2020-05-16 LAB — POCT URINALYSIS DIP (PROADVANTAGE DEVICE)
Bilirubin, UA: NEGATIVE
Glucose, UA: NEGATIVE mg/dL
Ketones, POC UA: NEGATIVE mg/dL
Nitrite, UA: POSITIVE — AB
Protein Ur, POC: 30 mg/dL — AB
Specific Gravity, Urine: 1.03
Urobilinogen, Ur: 0.2
pH, UA: 6 (ref 5.0–8.0)

## 2020-05-16 MED ORDER — SULFAMETHOXAZOLE-TRIMETHOPRIM 800-160 MG PO TABS: 1.0000 | ORAL_TABLET | Freq: Two times a day (BID) | ORAL | 0 refills | Status: DC

## 2020-05-16 NOTE — Addendum Note (Signed)
Addended by: Elyse Jarvis on: 05/16/2020 05:38 PM   Modules accepted: Orders

## 2020-05-16 NOTE — Progress Notes (Addendum)
   Subjective:    Patient ID: Matthew Mckay, male    DOB: [birth year], 80 y.o.   MRN: [id number]  HPI He is here for evaluation of a several day history of urinary frequency, incontinence and dysuria.  Review of the record indicates he was treated him in December for similar symptoms with Septra.  He states that after 1 week he did get much better.  Also record indicates a previous history of prostate cancer and last year he had difficulty with a urethral stricture requiring dilatation.  Further discussion with him indicates that he has had difficulty with increasing difficulty with urination started roughly 1 month after he had the stricture procedure.   Review of Systems     Objective:   Physical Exam Alert and in no distress.  Exam of the penis shows no swelling or erythema. Urine dipstick and microscopic was positive.  Microscopic was positive for white cells and red cells as well as bacteria, TNTC       Assessment & Plan:  UTI symptoms - Plan: POCT Urinalysis DIP (Proadvantage Device)  Dysuria - Plan: Urine Culture, sulfamethoxazole-trimethoprim (BACTRIM DS) 800-160 MG tablet, Ambulatory referral to Urology  History of urethral stricture - Plan: Ambulatory referral to Urology

## 2020-05-16 NOTE — Patient Instructions (Signed)
When you pick up your antibiotic asked the pharmacist to point you towards getting Azo-Standard.  It will make your urine turn orange is yellow but it will help with your symptoms.

## 2020-05-18 LAB — URINE CULTURE

## 2020-05-25 ENCOUNTER — Other Ambulatory Visit: Payer: Self-pay | Admitting: Medical

## 2020-05-29 ENCOUNTER — Other Ambulatory Visit: Payer: Self-pay | Admitting: Medical

## 2020-06-25 ENCOUNTER — Other Ambulatory Visit: Payer: Self-pay | Admitting: Medical

## 2020-07-10 ENCOUNTER — Other Ambulatory Visit: Payer: Self-pay | Admitting: Medical

## 2020-07-30 ENCOUNTER — Other Ambulatory Visit: Payer: Self-pay | Admitting: Medical

## 2020-08-10 ENCOUNTER — Other Ambulatory Visit: Payer: Self-pay | Admitting: Medical

## 2020-08-25 ENCOUNTER — Other Ambulatory Visit: Payer: Self-pay | Admitting: Medical

## 2020-08-26 ENCOUNTER — Other Ambulatory Visit: Payer: Self-pay | Admitting: Medical

## 2020-08-29 ENCOUNTER — Other Ambulatory Visit: Payer: Self-pay | Admitting: Medical

## 2020-08-29 DIAGNOSIS — J3089 Other allergic rhinitis: Secondary | ICD-10-CM

## 2020-08-29 DIAGNOSIS — H1013 Acute atopic conjunctivitis, bilateral: Secondary | ICD-10-CM

## 2020-09-03 ENCOUNTER — Other Ambulatory Visit: Payer: Self-pay | Admitting: Medical

## 2020-09-13 ENCOUNTER — Other Ambulatory Visit: Payer: Self-pay | Admitting: Medical

## 2020-09-13 DIAGNOSIS — H1013 Acute atopic conjunctivitis, bilateral: Secondary | ICD-10-CM

## 2020-09-13 DIAGNOSIS — J3089 Other allergic rhinitis: Secondary | ICD-10-CM

## 2020-09-25 ENCOUNTER — Other Ambulatory Visit: Payer: Self-pay | Admitting: Medical

## 2020-10-12 ENCOUNTER — Other Ambulatory Visit: Payer: Self-pay | Admitting: Medical

## 2020-10-14 ENCOUNTER — Other Ambulatory Visit: Payer: Self-pay | Admitting: Medical

## 2020-10-14 NOTE — Telephone Encounter (Signed)
Pt was advised he needs an appt but states he would like to call back to schedule. I advised I would only refill for 30 days

## 2020-10-26 ENCOUNTER — Other Ambulatory Visit: Payer: Self-pay | Admitting: Medical

## 2020-11-08 ENCOUNTER — Other Ambulatory Visit: Payer: Self-pay | Admitting: Medical

## 2020-11-08 NOTE — Telephone Encounter (Signed)
Left message for pt to call back to scheduled an appt. Per last refill. Pt was notified that only 30 was going to be sent in but patient did not want to schedule at that time. I will deny

## 2020-11-12 ENCOUNTER — Ambulatory Visit (INDEPENDENT_AMBULATORY_CARE_PROVIDER_SITE_OTHER): Payer: Medicare Other | Admitting: Medical

## 2020-11-12 ENCOUNTER — Other Ambulatory Visit: Payer: Self-pay | Admitting: Medical

## 2020-11-12 ENCOUNTER — Other Ambulatory Visit: Payer: Self-pay

## 2020-11-12 VITALS — BP 124/82 | HR 60 | Temp 98.0°F | Wt 178.0 lb

## 2020-11-12 DIAGNOSIS — Z79899 Other long term (current) drug therapy: Secondary | ICD-10-CM | POA: Diagnosis not present

## 2020-11-12 DIAGNOSIS — R809 Proteinuria, unspecified: Secondary | ICD-10-CM | POA: Diagnosis not present

## 2020-11-12 DIAGNOSIS — E785 Hyperlipidemia, unspecified: Secondary | ICD-10-CM

## 2020-11-12 DIAGNOSIS — R7301 Impaired fasting glucose: Secondary | ICD-10-CM

## 2020-11-12 DIAGNOSIS — J309 Allergic rhinitis, unspecified: Secondary | ICD-10-CM | POA: Insufficient documentation

## 2020-11-12 DIAGNOSIS — I10 Essential (primary) hypertension: Secondary | ICD-10-CM

## 2020-11-12 DIAGNOSIS — L989 Disorder of the skin and subcutaneous tissue, unspecified: Secondary | ICD-10-CM | POA: Diagnosis not present

## 2020-11-12 NOTE — Progress Notes (Signed)
Subjective:  Matthew Mckay is a 80 y.o. male who presents for Chief Complaint  Patient presents with   other    Issues with BP med verapamil 240 allergies bothering him lots of drainage.      Here for med check follow-up.  Hypertension-compliant with verapamil and lisinopril HCT.  He is about out of his verapamil which prompted this visit.  He notes that some of his friends thought his dose was pretty high.  He does not check blood pressures elsewhere.  He has no symptoms of concern, no chest pain or palpitations or shortness of breath no edema  He is compliant with his Lipitor for hyperlipidemia without complaint  He continues to have chronic allergy problems.  He uses Flonase regularly.  This seems to do pretty well for him.  He sometimes gets drippy runny nose but most the time he does fine.  He continues on Xyzal.  He saw Dr. Neldon Mc in the past  Skin: He notes cutting himself shaving on his cheek recently but he also has a crusty area on his right cheek that he gets weepy and bleeds at times  He occasionally gets constipation but takes MiraLAX rarely which helps  He sees Dr. Tresa Moore, urology periodically for his history of urinary issues and urinary retention  He turns 80 tomorrow.  He is excited about his birthday.  He has 9 grandchildren.  His son in [place] recently moved to [place], so they are closer to home now.  He has worked at the [employer] for over 22 years now.  No other aggravating or relieving factors.    No other c/o.  Past Medical History:  Diagnosis Date   Allergy    Anxiety    Chronic sinusitis    Diverticulosis    sigmoid colon and increased vascularization due to prior radiation, per 2013 colonoscopy   GERD (gastroesophageal reflux disease)    Hemorrhoid    Hyperlipidemia    Hypertension    Prostate cancer ([place])    history of radiation therapy   Seasonal allergies    Urinary retention 2019   Current Outpatient Medications on File Prior to Visit   Medication Sig Dispense Refill   atorvastatin (LIPITOR) 10 MG tablet TAKE 1 TABLET BY MOUTH EVERY DAY 90 tablet 0   famotidine (PEPCID) 40 MG tablet TAKE 1 TABLET BY MOUTH EVERY DAY 30 tablet 0   fluticasone (FLONASE) 50 MCG/ACT nasal spray SPRAY 2 SPRAYS INTO EACH NOSTRIL EVERY DAY 48 mL 0   guaiFENesin (MUCINEX) 600 MG 12 hr tablet Take 1 tablet (600 mg total) by mouth 2 (two) times daily. 10 tablet 0   levocetirizine (XYZAL) 5 MG tablet TAKE 1 TABLET BY MOUTH EVERY DAY IN THE EVENING 90 tablet 0   lisinopril-hydrochlorothiazide (ZESTORETIC) 20-12.5 MG tablet TAKE 1 TABLET BY MOUTH EVERY DAY 30 tablet 1   meclizine (ANTIVERT) 25 MG tablet Take 1 tablet (25 mg total) by mouth 2 (two) times daily. 30 tablet 0   nystatin cream (MYCOSTATIN) Apply 1 application topically 2 (two) times daily. 30 g 1   olopatadine (PATANOL) 0.1 % ophthalmic solution Place 1 drop into both eyes 2 (two) times daily. 5 mL 2   polyethylene glycol powder (GLYCOLAX/MIRALAX) powder 1 capfull daily in beverage 3350 g 3   polyvinyl alcohol (LIQUIFILM TEARS) 1.4 % ophthalmic solution Place 1 drop into both eyes as needed for dry eyes.     verapamil (CALAN-SR) 240 MG CR tablet TAKE 1 TABLET (  240 MG TOTAL) BY MOUTH AT BEDTIME. 30 tablet 0   predniSONE (DELTASONE) 20 MG tablet Take 1 tablet (20 mg total) by mouth 2 (two) times daily. (Patient not taking: No sig reported) 10 tablet 0   sulfamethoxazole-trimethoprim (BACTRIM DS) 800-160 MG tablet Take 1 tablet by mouth 2 (two) times daily. (Patient not taking: Reported on 11/12/2020) 20 tablet 0   No current facility-administered medications on file prior to visit.     The following portions of the patient's history were reviewed and updated as appropriate: allergies, current medications, past family history, past medical history, past social history, past surgical history and problem list.  ROS Otherwise as in subjective above  Objective: BP 124/82   Pulse 60   Temp 98 F  (36.7 C)   Wt 178 lb (80.7 kg)   BMI 26.29 kg/m   General appearance: alert, no distress, well developed, well nourished HEENT: normocephalic, sclerae anicteric, conjunctiva pink and moist, TMs pearly, nares patent, no discharge or erythema, pharynx normal Oral cavity: MMM, no lesions Neck: supple, no lymphadenopathy, no thyromegaly, no masses Heart: RRR, normal S1, S2, no murmurs Lungs: CTA bilaterally, no wheezes, rhonchi, or rales Abdomen: +bs, soft, non tender, non distended, no masses, no hepatomegaly, no splenomegaly Pulses: 2+ radial pulses, 2+ pedal pulses, normal cap refill Ext: no edema Skin: Dry crusty lesions on his dorsum of the hands, several crusted lesions on the cheeks and one on the left tragus.  All suggestive of AK's    Assessment: Encounter Diagnoses  Name Primary?   Essential hypertension Yes   Microalbuminuria    Hyperlipidemia, unspecified hyperlipidemia type    High risk medication use    Impaired fasting blood sugar    Skin lesion    Chronic allergic rhinitis      Plan: We reviewed over his health issues and medications.  Overall he seems to be doing okay with no particular concerns today  Hypertension-continue current medication, routine labs today  Hyperlipidemia-continue current medicine, routine labs today  Skin lesion-referral back to Dr. Allyn Kenner for updated skin surveillance and evaluation  Routine labs today given his medications  Chronic allergies-continue current therapy  Santhosh was seen today for other.  Diagnoses and all orders for this visit:  Essential hypertension -     Comprehensive metabolic panel -     CBC with Differential/Platelet  Microalbuminuria -     Microalbumin/Creatinine Ratio, Urine  Hyperlipidemia, unspecified hyperlipidemia type -     LDL Cholesterol, Direct  High risk medication use -     Comprehensive metabolic panel -     CBC with Differential/Platelet  Impaired fasting blood sugar -      Hemoglobin A1c  Skin lesion -     Ambulatory referral to Dermatology  Chronic allergic rhinitis   Follow up: pending labs

## 2020-11-13 ENCOUNTER — Other Ambulatory Visit: Payer: Self-pay | Admitting: Medical

## 2020-11-13 DIAGNOSIS — H1013 Acute atopic conjunctivitis, bilateral: Secondary | ICD-10-CM

## 2020-11-13 DIAGNOSIS — J3089 Other allergic rhinitis: Secondary | ICD-10-CM

## 2020-11-13 LAB — CBC WITH DIFFERENTIAL/PLATELET
Basophils Absolute: 0 10*3/uL (ref 0.0–0.2)
Basos: 0 %
EOS (ABSOLUTE): 0.3 10*3/uL (ref 0.0–0.4)
Eos: 3 %
Hematocrit: 40.7 % (ref 37.5–51.0)
Hemoglobin: 13.8 g/dL (ref 13.0–17.7)
Immature Grans (Abs): 0 10*3/uL (ref 0.0–0.1)
Immature Granulocytes: 0 %
Lymphocytes Absolute: 3.9 10*3/uL — ABNORMAL HIGH (ref 0.7–3.1)
Lymphs: 45 %
MCH: 29.2 pg (ref 26.6–33.0)
MCHC: 33.9 g/dL (ref 31.5–35.7)
MCV: 86 fL (ref 79–97)
Monocytes Absolute: 0.7 10*3/uL (ref 0.1–0.9)
Monocytes: 8 %
Neutrophils Absolute: 3.8 10*3/uL (ref 1.4–7.0)
Neutrophils: 44 %
Platelets: 242 10*3/uL (ref 150–450)
RBC: 4.73 x10E6/uL (ref 4.14–5.80)
RDW: 12.2 % (ref 11.6–15.4)
WBC: 8.8 10*3/uL (ref 3.4–10.8)

## 2020-11-13 LAB — COMPREHENSIVE METABOLIC PANEL
ALT: 10 IU/L (ref 0–44)
AST: 17 IU/L (ref 0–40)
Albumin/Globulin Ratio: 1.8 (ref 1.2–2.2)
Albumin: 4.4 g/dL (ref 3.6–4.6)
Alkaline Phosphatase: 53 IU/L (ref 44–121)
BUN/Creatinine Ratio: 28 — ABNORMAL HIGH (ref 10–24)
BUN: 22 mg/dL (ref 8–27)
Bilirubin Total: 0.3 mg/dL (ref 0.0–1.2)
CO2: 24 mmol/L (ref 20–29)
Calcium: 9.3 mg/dL (ref 8.6–10.2)
Chloride: 102 mmol/L (ref 96–106)
Creatinine, Ser: 0.79 mg/dL (ref 0.76–1.27)
Globulin, Total: 2.5 g/dL (ref 1.5–4.5)
Glucose: 82 mg/dL (ref 65–99)
Potassium: 4.1 mmol/L (ref 3.5–5.2)
Sodium: 142 mmol/L (ref 134–144)
Total Protein: 6.9 g/dL (ref 6.0–8.5)
eGFR: 89 mL/min/{1.73_m2} (ref >59)

## 2020-11-13 LAB — MICROALBUMIN / CREATININE URINE RATIO
Creatinine, Urine: 127 mg/dL
Microalb/Creat Ratio: 12 mg/g creat (ref 0–29)
Microalbumin, Urine: 15.5 ug/mL

## 2020-11-13 LAB — LDL CHOLESTEROL, DIRECT: LDL Direct: 83 mg/dL (ref 0–99)

## 2020-11-13 LAB — HEMOGLOBIN A1C
Est. average glucose Bld gHb Est-mCnc: 131 mg/dL
Hgb A1c MFr Bld: 6.2 % — ABNORMAL HIGH (ref 4.8–5.6)

## 2020-11-13 MED ORDER — LISINOPRIL-HYDROCHLOROTHIAZIDE 20-12.5 MG PO TABS: 1.0000 | ORAL_TABLET | Freq: Every day | ORAL | 3 refills | Status: DC

## 2020-11-13 MED ORDER — FLUTICASONE PROPIONATE 50 MCG/ACT NA SUSP: NASAL | 11 refills | Status: DC

## 2020-11-13 MED ORDER — LEVOCETIRIZINE DIHYDROCHLORIDE 5 MG PO TABS: ORAL_TABLET | ORAL | 3 refills | Status: DC

## 2020-11-13 MED ORDER — VERAPAMIL HCL ER 240 MG PO TBCR: 240.0000 mg | EXTENDED_RELEASE_TABLET | Freq: Every day | ORAL | 3 refills | Status: DC

## 2020-11-13 MED ORDER — FAMOTIDINE 40 MG PO TABS: 40.0000 mg | ORAL_TABLET | Freq: Every day | ORAL | 3 refills | Status: DC

## 2020-11-13 MED ORDER — ATORVASTATIN CALCIUM 10 MG PO TABS: 10.0000 mg | ORAL_TABLET | Freq: Every day | ORAL | 3 refills | Status: DC

## 2020-12-06 DIAGNOSIS — L570 Actinic keratosis: Secondary | ICD-10-CM | POA: Diagnosis not present

## 2020-12-06 DIAGNOSIS — C44319 Basal cell carcinoma of skin of other parts of face: Secondary | ICD-10-CM | POA: Diagnosis not present

## 2020-12-06 DIAGNOSIS — S60561A Insect bite (nonvenomous) of right hand, initial encounter: Secondary | ICD-10-CM | POA: Diagnosis not present

## 2020-12-06 DIAGNOSIS — X32XXXD Exposure to sunlight, subsequent encounter: Secondary | ICD-10-CM | POA: Diagnosis not present

## 2021-01-08 ENCOUNTER — Telehealth: Payer: Medicare Other | Admitting: Family Medicine

## 2021-01-09 ENCOUNTER — Ambulatory Visit (INDEPENDENT_AMBULATORY_CARE_PROVIDER_SITE_OTHER): Payer: Medicare Other | Admitting: Medical

## 2021-01-09 ENCOUNTER — Other Ambulatory Visit: Payer: Self-pay

## 2021-01-09 ENCOUNTER — Encounter: Payer: Self-pay | Admitting: Medical

## 2021-01-09 VITALS — BP 116/72 | HR 73 | Temp 98.8°F | Wt 171.2 lb

## 2021-01-09 DIAGNOSIS — R1110 Vomiting, unspecified: Secondary | ICD-10-CM | POA: Insufficient documentation

## 2021-01-09 DIAGNOSIS — R112 Nausea with vomiting, unspecified: Secondary | ICD-10-CM | POA: Diagnosis not present

## 2021-01-09 DIAGNOSIS — R197 Diarrhea, unspecified: Secondary | ICD-10-CM | POA: Insufficient documentation

## 2021-01-09 DIAGNOSIS — C4490 Unspecified malignant neoplasm of skin, unspecified: Secondary | ICD-10-CM

## 2021-01-09 DIAGNOSIS — K5900 Constipation, unspecified: Secondary | ICD-10-CM | POA: Insufficient documentation

## 2021-01-09 DIAGNOSIS — I10 Essential (primary) hypertension: Secondary | ICD-10-CM | POA: Diagnosis not present

## 2021-01-09 DIAGNOSIS — R682 Dry mouth, unspecified: Secondary | ICD-10-CM | POA: Insufficient documentation

## 2021-01-09 DIAGNOSIS — A059 Bacterial foodborne intoxication, unspecified: Secondary | ICD-10-CM | POA: Insufficient documentation

## 2021-01-09 DIAGNOSIS — K644 Residual hemorrhoidal skin tags: Secondary | ICD-10-CM

## 2021-01-09 MED ORDER — LISINOPRIL 20 MG PO TABS: 20.0000 mg | ORAL_TABLET | Freq: Every day | ORAL | 2 refills | Status: DC

## 2021-01-09 MED ORDER — HYDROCORTISONE 2.5 % EX CREA: TOPICAL_CREAM | Freq: Two times a day (BID) | CUTANEOUS | 1 refills | Status: DC

## 2021-01-09 NOTE — Patient Instructions (Addendum)
Recommendations: Given the recent diarrhea and vomiting, you are a little dehydrated  Re-hydrate over the next few days such as water, soup broth, Gatorade G2 Consider Pepto Bismol over the counter 1-2 times daily for a few times Try and get 80-100 ounces of water daily Consider using either 1/2 or 1 whole cap full of miralax powder daily The goal is a daily soft formed stool, not hard dry stool.   We want to avoid bloating, lots of gas, hard to pass stool which will aggravate the hemorrhoids If your stools and bowels are not improving over the next month, there are other medications we can try for constipation Make sure you are getting greens such as salad, turnip greens, or collards, spinach Get good fiber daily STOP lisinopril HCT due to being a little dry BEGIN Lisinopril plain without the HCT Continue your other medicaiton as usual  Recheck in a month on blood pressure

## 2021-01-09 NOTE — Progress Notes (Signed)
Subjective:  Matthew Mckay is a 80 y.o. male who presents for Chief Complaint  Patient presents with   Rectal Bleeding    States r/t hemorrhoids started Sunday afternoon   Vomiting    With diarrhea Sunday after eating chicken Sunday that him and daughter states was not cooked all the way. Now he has not had a bowel movement since Sunday      Here with daughter Matthew Mckay.  She lives across the street from him.  This past Saturday he was spending time with his family in [place], [place]..  The next day they had bought some [place] food.  They felt like the chicken was not cooked thoroughly.  A little later that day he had 1 major episode of nausea vomiting and a blowout diarrhea episode.  He had diarrhea all over his clothes.  Since that episode he has gradually started feeling better.  He still has a little bit decreased appetite but no additional diarrhea no nausea no other vomiting.  He does have some bloating and gas but that is about it.  He did have some hemorrhoid bleeding on Sunday but no bleeding since then.  He wanted come in to get checked out to see if this was food poisoning versus stomach virus.  He denies fever, no body aches or chills, he has not had a bowel movement since Monday.  No urinary issues today.  Otherwise normal state of health.  We did discuss that he can sometimes have harder stool and maybe only a bowel movement every other day.  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    No other c/o.  The following portions of the patient's history were reviewed and updated as appropriate: allergies, current medications, past family history, past medical history, past social history, past surgical history and problem list.  ROS Otherwise as in subjective  above    Objective: BP 116/72   Pulse 73   Temp 98.8 F (37.1 C) (Tympanic)   Wt 171 lb 3.2 oz (77.7 kg)   SpO2 97%   BMI 25.28 kg/m  Wt Readings from Last 3 Encounters:  01/09/21 171 lb 3.2 oz (77.7 kg)  11/12/20 178 lb (80.7 kg)  05/16/20 177 lb 12.8 oz (80.6 kg)   BP Readings from Last 3 Encounters:  01/09/21 116/72  11/12/20 124/82  05/16/20 128/72    General appearance: alert, no distress, well developed, well nourished Oral cavity: Somewhat dry mucous membranes Neck: supple, no lymphadenopathy, no thyromegaly, no masses Heart: RRR, normal S1, S2, no murmurs Lungs: CTA bilaterally, no wheezes, rhonchi, or rales Abdomen: +bs, soft, non tender, non distended, no masses, no hepatomegaly, no splenomegaly Pulses: 2+ radial pulses, 2+ pedal pulses, normal cap refill Ext: no edema   Assessment: Encounter Diagnoses  Name Primary?   Food poisoning Yes   Non-intractable vomiting with nausea, unspecified vomiting type    Diarrhea, unspecified type    Constipation, unspecified constipation type    Essential hypertension    Dry mouth    Skin cancer    External hemorrhoid      Plan: We discussed his recent symptoms.  His symptoms suggest recent food poisoning.  Fortunately was a fairly mild episode.  His vomiting and diarrhea has resolved.  I recommended he rehydrate over the next  few days.  He can use Pepto-Bismol for a few days.  We discussed importance of good water intake.  Symptoms should gradually improve  Regarding hemorrhoids-we discussed hot bath soaks, hydrocortisone cream as below as needed, and work on prevention  He is also been having some constipation prior to the food poisoning episode.  We discussed the need for good hydration, good fiber intake.  He will begin MiraLAX one half or 1 capful daily.  Avoid foods that constipate you as discussed  Hypertension-I changed his medication today and stopped the lisinopril HCT and just changed to plain HCTZ as he  was getting a lot of dry mouth.  His blood pressures are stable enough that he may not need to be on the fluid pill at this point.  I will see him back in 1 month to recheck on constipation and blood pressure  We discussed the following instructions:  Patient Instructions  Recommendations: Given the recent diarrhea and vomiting, you are a little dehydrated  Re-hydrate over the next few days such as water, soup broth, Gatorade G2 Consider Pepto Bismol over the counter 1-2 times daily for a few times Try and get 80-100 ounces of water daily Consider using either 1/2 or 1 whole cap full of miralax powder daily The goal is a daily soft formed stool, not hard dry stool.   We want to avoid bloating, lots of gas, hard to pass stool which will aggravate the hemorrhoids If your stools and bowels are not improving over the next month, there are other medications we can try for constipation Make sure you are getting greens such as salad, turnip greens, or collards, spinach Get good fiber daily STOP lisinopril HCT due to being a little dry BEGIN Lisinopril plain without the HCT Continue your other medicaiton as usual  Recheck in a month on blood pressure      July was seen today for rectal bleeding and vomiting.  Diagnoses and all orders for this visit:  Food poisoning  Non-intractable vomiting with nausea, unspecified vomiting type  Diarrhea, unspecified type  Constipation, unspecified constipation type  Essential hypertension  Dry mouth  Skin cancer  External hemorrhoid  Other orders -     lisinopril (ZESTRIL) 20 MG tablet; Take 1 tablet (20 mg total) by mouth daily. -     hydrocortisone 2.5 % cream; Apply topically 2 (two) times daily.   Follow up: 1 month

## 2021-01-21 DIAGNOSIS — Z85828 Personal history of other malignant neoplasm of skin: Secondary | ICD-10-CM | POA: Diagnosis not present

## 2021-01-21 DIAGNOSIS — Z08 Encounter for follow-up examination after completed treatment for malignant neoplasm: Secondary | ICD-10-CM | POA: Diagnosis not present

## 2021-01-21 DIAGNOSIS — C44329 Squamous cell carcinoma of skin of other parts of face: Secondary | ICD-10-CM | POA: Diagnosis not present

## 2021-02-03 ENCOUNTER — Ambulatory Visit (INDEPENDENT_AMBULATORY_CARE_PROVIDER_SITE_OTHER): Payer: Medicare Other | Admitting: Medical

## 2021-02-03 VITALS — Wt 180.0 lb

## 2021-02-03 DIAGNOSIS — J3489 Other specified disorders of nose and nasal sinuses: Secondary | ICD-10-CM | POA: Insufficient documentation

## 2021-02-03 DIAGNOSIS — J988 Other specified respiratory disorders: Secondary | ICD-10-CM | POA: Insufficient documentation

## 2021-02-03 DIAGNOSIS — M79672 Pain in left foot: Secondary | ICD-10-CM | POA: Diagnosis not present

## 2021-02-03 DIAGNOSIS — R300 Dysuria: Secondary | ICD-10-CM | POA: Diagnosis not present

## 2021-02-03 DIAGNOSIS — Z87898 Personal history of other specified conditions: Secondary | ICD-10-CM | POA: Insufficient documentation

## 2021-02-03 MED ORDER — SULFAMETHOXAZOLE-TRIMETHOPRIM 800-160 MG PO TABS: 1.0000 | ORAL_TABLET | Freq: Two times a day (BID) | ORAL | 0 refills | Status: DC

## 2021-02-03 NOTE — Progress Notes (Signed)
Subjective:     Patient ID: Matthew Mckay, male   DOB: [birth year], 80 y.o.   MRN: [id number]  This visit type was conducted due to national recommendations for restrictions regarding the COVID-19 Pandemic (e.g. social distancing) in an effort to limit this patient's exposure and mitigate transmission in our community.  Due to their co-morbid illnesses, this patient is at least at moderate risk for complications without adequate follow up.  This format is felt to be most appropriate for this patient at this time.    Documentation for virtual audio and video telecommunications through [place] encounter:  The patient was located at home. The provider was located in the office. The patient did consent to this visit and is aware of possible charges through their insurance for this visit.  The other persons participating in this telemedicine service were none. Time spent on call was 20 minutes and in review of previous records 20 minutes total.  This virtual service is not related to other E/M service within previous 7 days.   HPI Chief Complaint  Patient presents with   possible UTI and Head Cold    Head cold x last week, sore throat, sneezing and ear pain. Burning with urination started last week   Virtual consult for concerns  He notes over a week hx/o head pressure, head cold, head congestion, sneezing, thick mucous causing throat clearing, but no cough, no fever, no NVD, no body aches or chills.  Using hauls throat lozenges and honey.    Is fully vaccinated for covid, suppose to get flu shot soon  Also has urine concerns.  Has hx/o urinary retention and history of urinary tract infection.  For past several days having burning with urination, urinary discomfort, frequently.  No odor, no cloudy urine, no obvious blood in urine, no pain in testicles, no swelling.  No current retention problems.  Has possible left heel spur, pain in left heel. Always on the go, works for [employer].  Past Medical History:  Diagnosis Date   Allergy    Anxiety    Chronic sinusitis    Diverticulosis    sigmoid colon and increased vascularization due to prior radiation, per 2013 colonoscopy   GERD (gastroesophageal reflux disease)    Hemorrhoid    Hyperlipidemia    Hypertension    Prostate cancer ([place])    history of radiation therapy   Seasonal allergies    Urinary retention 2019   Current Outpatient Medications on File Prior to Visit  Medication Sig Dispense Refill   atorvastatin (LIPITOR) 10 MG tablet Take 1 tablet (10 mg total) by mouth daily. 90 tablet 3   famotidine (PEPCID) 40 MG tablet Take 1 tablet (40 mg total) by mouth daily. 90 tablet 3   fluticasone (FLONASE) 50 MCG/ACT nasal spray SPRAY 2 SPRAYS INTO EACH NOSTRIL EVERY DAY 48 mL 11   levocetirizine (XYZAL) 5 MG tablet TAKE 1 TABLET BY MOUTH EVERY DAY IN THE EVENING 90 tablet 3   lisinopril (ZESTRIL) 20 MG tablet Take 1 tablet (20 mg total) by mouth daily. 30 tablet 2   olopatadine (PATANOL) 0.1 % ophthalmic solution Place 1 drop into both eyes 2 (two) times daily. 5 mL 2   omeprazole (PRILOSEC) 40 MG capsule TAKE 1 CAPSULE BY MOUTH EVERY DAY 90 capsule 0   polyethylene glycol powder (GLYCOLAX/MIRALAX) powder 1 capfull daily in beverage 3350 g 3   polyvinyl alcohol (LIQUIFILM TEARS) 1.4 % ophthalmic solution Place 1 drop into both eyes as  needed for dry eyes.     verapamil (CALAN-SR) 240 MG CR tablet Take 1 tablet (240 mg total) by mouth at bedtime. 90 tablet 3   No current facility-administered medications on file prior to visit.     Review of Systems As in subjective    Objective:   Physical Exam Due to coronavirus pandemic stay at home measures, patient visit was virtual and they were not examined in person.   Wt 180 lb (81.6 kg)   BMI 26.58 kg/m    Gen: nad No labored breathing, no obvious wheezing Otherwise not examined      Assessment:     Encounter Diagnoses  Name Primary?   Sinus  pressure Yes   Dysuria    Pain of left heel    Respiratory tract infection    History of urinary retention        Plan:      We discussed limitations of virtual consult.  Sinus pressure, respiratory tract infection, times greater than 1 week.  Discussed supportive measures, rest, hydration.  Dysuria -given history of urinary tract infection urinary retention and given current new acute symptoms, begin Bactrim antibiotic empirically.  Hydrate well.  If worse or not improving over the next 3 to 4 days, particularly if fever or worse urinary symptoms then recheck right away.  Heel pain-we discussed possible differential.  Begin heel cups over-the-counter.  Can use essential oil lavender topically or the other cream he is using over-the-counter now for pain.  If not seeing improvement in the next few weeks and recheck in person  Cassius was seen today for possible uti and head cold.  Diagnoses and all orders for this visit:  Sinus pressure  Dysuria  Pain of left heel  Respiratory tract infection  History of urinary retention  Other orders -     sulfamethoxazole-trimethoprim (BACTRIM DS) 800-160 MG tablet; Take 1 tablet by mouth 2 (two) times daily.   F/u prn

## 2021-02-04 DIAGNOSIS — C44329 Squamous cell carcinoma of skin of other parts of face: Secondary | ICD-10-CM | POA: Diagnosis not present

## 2021-02-11 ENCOUNTER — Other Ambulatory Visit: Payer: Self-pay | Admitting: Medical

## 2021-02-11 NOTE — Telephone Encounter (Signed)
Pt has upcoming appt 

## 2021-02-13 ENCOUNTER — Ambulatory Visit (INDEPENDENT_AMBULATORY_CARE_PROVIDER_SITE_OTHER): Payer: Medicare Other | Admitting: Medical

## 2021-02-13 ENCOUNTER — Other Ambulatory Visit: Payer: Self-pay

## 2021-02-13 VITALS — BP 110/60 | HR 76 | Temp 97.7°F | Wt 168.2 lb

## 2021-02-13 DIAGNOSIS — N401 Enlarged prostate with lower urinary tract symptoms: Secondary | ICD-10-CM

## 2021-02-13 DIAGNOSIS — Z23 Encounter for immunization: Secondary | ICD-10-CM | POA: Diagnosis not present

## 2021-02-13 DIAGNOSIS — R109 Unspecified abdominal pain: Secondary | ICD-10-CM | POA: Insufficient documentation

## 2021-02-13 DIAGNOSIS — R140 Abdominal distension (gaseous): Secondary | ICD-10-CM | POA: Diagnosis not present

## 2021-02-13 DIAGNOSIS — R198 Other specified symptoms and signs involving the digestive system and abdomen: Secondary | ICD-10-CM | POA: Diagnosis not present

## 2021-02-13 DIAGNOSIS — N3943 Post-void dribbling: Secondary | ICD-10-CM

## 2021-02-13 DIAGNOSIS — Z8546 Personal history of malignant neoplasm of prostate: Secondary | ICD-10-CM

## 2021-02-13 DIAGNOSIS — R197 Diarrhea, unspecified: Secondary | ICD-10-CM | POA: Diagnosis not present

## 2021-02-13 DIAGNOSIS — R634 Abnormal weight loss: Secondary | ICD-10-CM

## 2021-02-13 NOTE — Progress Notes (Signed)
Subjective:  Matthew Mckay is a 80 y.o. male who presents for Chief Complaint  Patient presents with   follow-up     Follow-up on hemmorroids. Had some diarrhea this morning alittle bit and he might have a cold down there.      Here for some concerns about loose stool today.  Primary Care Provider Shaniqwa Horsman, Camelia Eng, PA-C here for primary care   Current Health Care Team: Dentist, Dr. Lafonda Mosses, Dr. Venetia Maxon, in [facility] chiropractor on [facility] Dr. Clarene Essex, GI Dr. Carmelina Peal and Johnanna Schneiders, allergist Dr. Allyn Kenner, dermatology Dr. Tresa Moore, Urology currently, had seen Urologist in [place] in the past, treatment for prostate [place].  Was cleaning up leaves off deck this morning, was outside messing around.  Had breakfast.  Then a little later got the runs, diarrhea out of the blue. Stomach /abdomen feels fine, no pain.   Toilet filled up to the top with the runs.  He denies any diarrhea in the past week or so.   This was isolated event today.  No blood.  Doesn't feel any hemorrhoids poking out today.  Still taking famotidine and prilosec.  Normally is about 180lb.  He came in 12/2020 for bowel issues as well.   Had recent UTI a few weeks ago.    Has skin surgery on forehead about 2 weeks ago.   No other aggravating or relieving factors.    No other c/o.  Past Medical History:  Diagnosis Date   Allergy    Anxiety    Chronic sinusitis    Diverticulosis    sigmoid colon and increased vascularization due to prior radiation, per 2013 colonoscopy   GERD (gastroesophageal reflux disease)    Hemorrhoid    Hyperlipidemia    Hypertension    Prostate cancer ([place])    history of radiation therapy   Seasonal allergies    Urinary retention 2019   Current Outpatient Medications on File Prior to Visit  Medication Sig Dispense Refill   atorvastatin (LIPITOR) 10 MG tablet Take 1 tablet (10 mg total) by mouth daily. 90 tablet 3   famotidine (PEPCID) 40 MG tablet Take 1 tablet (40  mg total) by mouth daily. 90 tablet 3   fluticasone (FLONASE) 50 MCG/ACT nasal spray SPRAY 2 SPRAYS INTO EACH NOSTRIL EVERY DAY 48 mL 11   levocetirizine (XYZAL) 5 MG tablet TAKE 1 TABLET BY MOUTH EVERY DAY IN THE EVENING 90 tablet 3   lisinopril (ZESTRIL) 20 MG tablet Take 1 tablet (20 mg total) by mouth daily. 30 tablet 2   olopatadine (PATANOL) 0.1 % ophthalmic solution Place 1 drop into both eyes 2 (two) times daily. 5 mL 2   omeprazole (PRILOSEC) 40 MG capsule TAKE 1 CAPSULE BY MOUTH EVERY DAY 90 capsule 0   polyethylene glycol powder (GLYCOLAX/MIRALAX) powder 1 capfull daily in beverage 3350 g 3   polyvinyl alcohol (LIQUIFILM TEARS) 1.4 % ophthalmic solution Place 1 drop into both eyes as needed for dry eyes.     verapamil (CALAN-SR) 240 MG CR tablet Take 1 tablet (240 mg total) by mouth at bedtime. 90 tablet 3   No current facility-administered medications on file prior to visit.    The following portions of the patient's history were reviewed and updated as appropriate: allergies, current medications, past family history, past medical history, past social history, past surgical history and problem list.  ROS Otherwise as in subjective above  Objective: BP 110/60   Pulse 76  Temp 97.7 F (36.5 C)   Wt 168 lb 3.2 oz (76.3 kg)   BMI 24.84 kg/m   Wt Readings from Last 3 Encounters:  02/13/21 168 lb 3.2 oz (76.3 kg)  02/03/21 180 lb (81.6 kg)  01/09/21 171 lb 3.2 oz (77.7 kg)   General appearance: alert, no distress, well developed, well nourished Abdomen: +bs, soft, mild LLQ tenderness othewise non tender, non distended, no masses, no hepatomegaly, no splenomegaly Back: nontender Pulses: 2+ radial pulses, 2+ pedal pulses, normal cap refill Ext: no edema GU: normal male, uncirc, no hernia , no mass, no lymphadenopahty Rectal: anus without obvious hemorrhoid or abnormality    Assessment: Encounter Diagnoses  Name Primary?   Abdominal pain, unspecified abdominal  location Yes   Weight loss    Needs flu shot    Diarrhea, unspecified type    Bloating    History of prostate cancer    Benign prostatic hyperplasia with post-void dribbling    Change in bowel function      Plan: We discussed symptoms and concerns.  Keep in mind that 1 isolated loose stool or diarrhea is not necessarily a concern.  Your diarrhea stool today may be related to something you ate yesterday since she did have a special occasion meal yesterday.  However given that you have had 2 recent visits for bowel changes, unusual changes in bowels, bloating, gas, pain including tender on exam today, and recent weight loss, we will move forward with CT scan and additional evaluation  You can use over-the-counter Pepto-Bismol twice daily for the next few days  Continue your acid reflux medicine famotidine and omeprazole  Avoid spicy foods, acidic foods such as lemons and grapefruits, avoid hot sauce, avoid deep fried greasy foods and large portions.  Monitor your weights at home.  If you continue to lose weight in the next few weeks then let me know   Terre was seen today for follow-up .  Diagnoses and all orders for this visit:  Abdominal pain, unspecified abdominal location -     Basic metabolic panel -     CT ABDOMEN PELVIS W CONTRAST; Future  Weight loss -     CT ABDOMEN PELVIS W CONTRAST; Future  Needs flu shot -     Flu Vaccine QUAD High Dose(Fluad)  Diarrhea, unspecified type -     CT ABDOMEN PELVIS W CONTRAST; Future  Bloating -     CT ABDOMEN PELVIS W CONTRAST; Future  History of prostate cancer -     CT ABDOMEN PELVIS W CONTRAST; Future -     PSA  Benign prostatic hyperplasia with post-void dribbling -     CT ABDOMEN PELVIS W CONTRAST; Future -     PSA  Change in bowel function -     CT ABDOMEN PELVIS W CONTRAST; Future   Follow up: pending CT

## 2021-02-13 NOTE — Patient Instructions (Signed)
We discussed symptoms and concerns.  Keep in mind that 1 isolated loose stool or diarrhea is not necessarily a concern.  Your diarrhea stool today may be related to something you ate yesterday since she did have a special occasion meal yesterday.  However given that you have had 2 recent visits for bowel changes, unusual changes in bowels, bloating, gas, pain including tender on exam today, and recent weight loss, we will move forward with CT scan and additional evaluation  You can use over-the-counter Pepto-Bismol twice daily for the next few days  Continue your acid reflux medicine famotidine and omeprazole  Avoid spicy foods, acidic foods such as lemons and grapefruits, avoid hot sauce, avoid deep fried greasy foods and large portions.  Monitor your weights at home.  If you continue to lose weight in the next few weeks then let me know

## 2021-02-14 LAB — BASIC METABOLIC PANEL
BUN/Creatinine Ratio: 25 — ABNORMAL HIGH (ref 10–24)
BUN: 30 mg/dL — ABNORMAL HIGH (ref 8–27)
CO2: 20 mmol/L (ref 20–29)
Calcium: 9.3 mg/dL (ref 8.6–10.2)
Chloride: 102 mmol/L (ref 96–106)
Creatinine, Ser: 1.2 mg/dL (ref 0.76–1.27)
Glucose: 168 mg/dL — ABNORMAL HIGH (ref 70–99)
Potassium: 4.1 mmol/L (ref 3.5–5.2)
Sodium: 138 mmol/L (ref 134–144)
eGFR: 60 mL/min/{1.73_m2} (ref >59)

## 2021-02-14 LAB — PSA: Prostate Specific Ag, Serum: 1.1 ng/mL (ref 0.0–4.0)

## 2021-03-10 ENCOUNTER — Other Ambulatory Visit: Payer: Medicare Other

## 2021-03-11 ENCOUNTER — Ambulatory Visit: Payer: Medicare Other | Admitting: Medical

## 2021-03-27 ENCOUNTER — Other Ambulatory Visit: Payer: Self-pay | Admitting: Medical

## 2021-03-27 ENCOUNTER — Ambulatory Visit (INDEPENDENT_AMBULATORY_CARE_PROVIDER_SITE_OTHER): Payer: Medicare Other | Admitting: Medical

## 2021-03-27 DIAGNOSIS — R490 Dysphonia: Secondary | ICD-10-CM

## 2021-03-27 DIAGNOSIS — R059 Cough, unspecified: Secondary | ICD-10-CM

## 2021-03-27 DIAGNOSIS — R634 Abnormal weight loss: Secondary | ICD-10-CM

## 2021-03-27 DIAGNOSIS — R140 Abdominal distension (gaseous): Secondary | ICD-10-CM

## 2021-03-27 DIAGNOSIS — R198 Other specified symptoms and signs involving the digestive system and abdomen: Secondary | ICD-10-CM

## 2021-03-27 DIAGNOSIS — N401 Enlarged prostate with lower urinary tract symptoms: Secondary | ICD-10-CM

## 2021-03-27 DIAGNOSIS — R197 Diarrhea, unspecified: Secondary | ICD-10-CM

## 2021-03-27 DIAGNOSIS — J3489 Other specified disorders of nose and nasal sinuses: Secondary | ICD-10-CM

## 2021-03-27 DIAGNOSIS — N3943 Post-void dribbling: Secondary | ICD-10-CM

## 2021-03-27 DIAGNOSIS — R109 Unspecified abdominal pain: Secondary | ICD-10-CM

## 2021-03-27 DIAGNOSIS — Z8546 Personal history of malignant neoplasm of prostate: Secondary | ICD-10-CM

## 2021-03-27 MED ORDER — DM-GUAIFENESIN ER 30-600 MG PO TB12: 1.0000 | ORAL_TABLET | Freq: Two times a day (BID) | ORAL | 0 refills | Status: DC

## 2021-03-27 MED ORDER — AMOXICILLIN 875 MG PO TABS: 875.0000 mg | ORAL_TABLET | Freq: Two times a day (BID) | ORAL | 0 refills | Status: DC

## 2021-03-27 NOTE — Progress Notes (Signed)
Subjective:     Patient ID: Matthew Mckay, male   DOB: 1940/11/10, 80 y.o.   MRN: [id number]  This visit type was conducted due to national recommendations for restrictions regarding the COVID-19 Pandemic (e.g. social distancing) in an effort to limit this patient's exposure and mitigate transmission in our community.  Due to their co-morbid illnesses, this patient is at least at moderate risk for complications without adequate follow up.  This format is felt to be most appropriate for this patient at this time.    Documentation for virtual audio and video telecommunications through [place] encounter:  The patient was located at home. The provider was located in the office. The patient did consent to this visit and is aware of possible charges through their insurance for this visit.  The other persons participating in this telemedicine service were none. Time spent on call was 20 minutes and in review of previous records 20 minutes total.  This virtual service is not related to other E/M service within previous 7 days.   HPI Chief Complaint  Patient presents with   Nasal Congestion    With chest congestion, denies fever and cough but has chills. Denies checking B/P or temp at home   Virtual consult today for illness.  He notes 2-3 day history of illness.  He notes hoarse throat, chest congestion, sore throat, mucous drainage from nose.  Has some post nasal drainage.  Has moderate cough.  Worse cough yesterday, today not as bad.  3-4 coworkers are sick with similar.   Had some chills and low grade fever 2 days ago.  No recent covid test.  No sob.  Daughter recently had similar illness.   Feels like bronchitis.  Used some antihistmaine.  No nausea, no vomting.  No other aggravating or relieving factors. No other complaint.   Past Medical History:  Diagnosis Date   Allergy    Anxiety    Chronic sinusitis    Diverticulosis    sigmoid colon and increased vascularization due to prior  radiation, per 2013 colonoscopy   GERD (gastroesophageal reflux disease)    Hemorrhoid    Hyperlipidemia    Hypertension    Prostate cancer ([place])    history of radiation therapy   Seasonal allergies    Urinary retention 2019   Current Outpatient Medications on File Prior to Visit  Medication Sig Dispense Refill   atorvastatin (LIPITOR) 10 MG tablet Take 1 tablet (10 mg total) by mouth daily. 90 tablet 3   famotidine (PEPCID) 40 MG tablet Take 1 tablet (40 mg total) by mouth daily. 90 tablet 3   fluticasone (FLONASE) 50 MCG/ACT nasal spray SPRAY 2 SPRAYS INTO EACH NOSTRIL EVERY DAY 48 mL 11   levocetirizine (XYZAL) 5 MG tablet TAKE 1 TABLET BY MOUTH EVERY DAY IN THE EVENING 90 tablet 3   lisinopril (ZESTRIL) 20 MG tablet Take 1 tablet (20 mg total) by mouth daily. 30 tablet 2   olopatadine (PATANOL) 0.1 % ophthalmic solution Place 1 drop into both eyes 2 (two) times daily. 5 mL 2   omeprazole (PRILOSEC) 40 MG capsule TAKE 1 CAPSULE BY MOUTH EVERY DAY 90 capsule 0   polyethylene glycol powder (GLYCOLAX/MIRALAX) powder 1 capfull daily in beverage 3350 g 3   polyvinyl alcohol (LIQUIFILM TEARS) 1.4 % ophthalmic solution Place 1 drop into both eyes as needed for dry eyes.     verapamil (CALAN-SR) 240 MG CR tablet Take 1 tablet (240 mg total) by mouth at bedtime. [place]  tablet 3   No current facility-administered medications on file prior to visit.    Review of Systems As in subjective    Objective:   Physical Exam Due to coronavirus pandemic stay at home measures, patient visit was virtual and they were not examined in person.   There were no vitals taken for this visit.  Gen: congested and hoarse sounding No obvious wheezing or labored breathing Answers questions in complete sentneces     Assessment:     Encounter Diagnoses  Name Primary?   Cough, unspecified type Yes   Sinus pressure    Hoarse        Plan:     We discussed symptoms and concerns and limitations of virtual  consult.  Begin medication as below.  Continue good water intake, we discussed supportive measures such as salt water gargles, warm fluids, voice rest.  If worse symptoms in the short-term if not improving within the next 3 to 4 days then recheck  Zakry was seen today for nasal congestion.  Diagnoses and all orders for this visit:  Cough, unspecified type  Sinus pressure  Hoarse  Other orders -     amoxicillin (AMOXIL) 875 MG tablet; Take 1 tablet (875 mg total) by mouth 2 (two) times daily for 10 days. -     dextromethorphan-guaiFENesin (MUCINEX DM) 30-600 MG 12hr tablet; Take 1 tablet by mouth 2 (two) times daily.   F/u prn

## 2021-03-29 ENCOUNTER — Inpatient Hospital Stay: Admission: RE | Admit: 2021-03-29 | Payer: Medicare Other | Source: Ambulatory Visit

## 2021-04-04 ENCOUNTER — Other Ambulatory Visit: Payer: Self-pay

## 2021-04-04 ENCOUNTER — Telehealth (INDEPENDENT_AMBULATORY_CARE_PROVIDER_SITE_OTHER): Payer: Medicare Other | Admitting: Family Medicine

## 2021-04-04 ENCOUNTER — Encounter: Payer: Self-pay | Admitting: Family Medicine

## 2021-04-04 VITALS — Ht 69.0 in | Wt 164.0 lb

## 2021-04-04 DIAGNOSIS — R059 Cough, unspecified: Secondary | ICD-10-CM

## 2021-04-04 MED ORDER — AMOXICILLIN-POT CLAVULANATE 875-125 MG PO TABS: 1.0000 | ORAL_TABLET | Freq: Two times a day (BID) | ORAL | 0 refills | Status: DC

## 2021-04-04 NOTE — Progress Notes (Signed)
° °  Subjective:    Patient ID: Matthew Mckay, male    DOB: [birth year], 80 y.o.   MRN: [id number]  HPI Documentation for virtual audio and video telecommunications through [place] encounter: The patient was located at home. 2 patient identifiers used.  The provider was located in the office. The patient did consent to this visit and is aware of possible charges through their insurance for this visit. The other persons participating in this telemedicine service were none. Time spent on call was 4 minutes and in review of previous records >17 minutes total for counseling and coordination of care. This virtual service is not related to other E/M service within previous 7 days.  He continues have difficulty with sneezing, rhinorrhea, sore throat, productive cough with chills but no fever or earache.  He has been on Amoxil but states that it does not really help that much.  Review of Systems     Objective:   Physical Exam Alert and in no distress.  His respiratory pattern appears normal.       Assessment & Plan:  Cough, unspecified type He probably has bronchitis unresponsive to the Amoxil and I will therefore give him Augmentin.  Cautioned that that if he does not get totally back to normal we Matthew Mckay this course we would need to see him, do blood work and x-rays.  He and his daughter expressed understanding of this.

## 2021-04-05 ENCOUNTER — Other Ambulatory Visit: Payer: Self-pay | Admitting: Medical

## 2021-04-16 ENCOUNTER — Other Ambulatory Visit: Payer: Self-pay | Admitting: Medical

## 2021-04-17 ENCOUNTER — Ambulatory Visit: Payer: Medicare Other | Admitting: Medical

## 2021-05-02 ENCOUNTER — Ambulatory Visit
Admission: RE | Admit: 2021-05-02 | Discharge: 2021-05-02 | Disposition: A | Discharge status: Home / Self Care | Payer: Medicare Other | Source: Ambulatory Visit | Attending: Medical | Admitting: Medical

## 2021-05-02 ENCOUNTER — Other Ambulatory Visit: Payer: Self-pay

## 2021-05-02 DIAGNOSIS — N281 Cyst of kidney, acquired: Secondary | ICD-10-CM | POA: Diagnosis not present

## 2021-05-02 DIAGNOSIS — R140 Abdominal distension (gaseous): Secondary | ICD-10-CM

## 2021-05-02 DIAGNOSIS — Z8546 Personal history of malignant neoplasm of prostate: Secondary | ICD-10-CM

## 2021-05-02 DIAGNOSIS — R634 Abnormal weight loss: Secondary | ICD-10-CM

## 2021-05-02 DIAGNOSIS — K5730 Diverticulosis of large intestine without perforation or abscess without bleeding: Secondary | ICD-10-CM | POA: Diagnosis not present

## 2021-05-02 DIAGNOSIS — R109 Unspecified abdominal pain: Secondary | ICD-10-CM | POA: Diagnosis not present

## 2021-05-02 DIAGNOSIS — K760 Fatty (change of) liver, not elsewhere classified: Secondary | ICD-10-CM | POA: Diagnosis not present

## 2021-05-02 DIAGNOSIS — N3943 Post-void dribbling: Secondary | ICD-10-CM

## 2021-05-02 DIAGNOSIS — R197 Diarrhea, unspecified: Secondary | ICD-10-CM

## 2021-05-02 DIAGNOSIS — R198 Other specified symptoms and signs involving the digestive system and abdomen: Secondary | ICD-10-CM

## 2021-05-02 MED ORDER — IOPAMIDOL (ISOVUE-300) INJECTION 61%
100.0000 mL | Freq: Once | INTRAVENOUS | Status: AC | PRN
  Administered 2021-05-02: 100 mL via INTRAVENOUS

## 2021-05-07 ENCOUNTER — Encounter: Payer: Self-pay | Admitting: Medical

## 2021-05-22 ENCOUNTER — Other Ambulatory Visit: Payer: Self-pay

## 2021-05-22 ENCOUNTER — Ambulatory Visit (INDEPENDENT_AMBULATORY_CARE_PROVIDER_SITE_OTHER): Payer: Medicare Other | Admitting: Medical

## 2021-05-22 VITALS — BP 120/70 | HR 62 | Wt 173.4 lb

## 2021-05-22 DIAGNOSIS — R32 Unspecified urinary incontinence: Secondary | ICD-10-CM

## 2021-05-22 DIAGNOSIS — N401 Enlarged prostate with lower urinary tract symptoms: Secondary | ICD-10-CM

## 2021-05-22 DIAGNOSIS — E785 Hyperlipidemia, unspecified: Secondary | ICD-10-CM

## 2021-05-22 DIAGNOSIS — I10 Essential (primary) hypertension: Secondary | ICD-10-CM

## 2021-05-22 DIAGNOSIS — R935 Abnormal findings on diagnostic imaging of other abdominal regions, including retroperitoneum: Secondary | ICD-10-CM

## 2021-05-22 DIAGNOSIS — Z79899 Other long term (current) drug therapy: Secondary | ICD-10-CM

## 2021-05-22 DIAGNOSIS — Z8546 Personal history of malignant neoplasm of prostate: Secondary | ICD-10-CM

## 2021-05-22 DIAGNOSIS — R109 Unspecified abdominal pain: Secondary | ICD-10-CM

## 2021-05-22 DIAGNOSIS — N281 Cyst of kidney, acquired: Secondary | ICD-10-CM

## 2021-05-22 DIAGNOSIS — R809 Proteinuria, unspecified: Secondary | ICD-10-CM

## 2021-05-22 DIAGNOSIS — K862 Cyst of pancreas: Secondary | ICD-10-CM | POA: Insufficient documentation

## 2021-05-22 DIAGNOSIS — R251 Tremor, unspecified: Secondary | ICD-10-CM

## 2021-05-22 DIAGNOSIS — K5909 Other constipation: Secondary | ICD-10-CM | POA: Diagnosis not present

## 2021-05-22 DIAGNOSIS — K219 Gastro-esophageal reflux disease without esophagitis: Secondary | ICD-10-CM

## 2021-05-22 DIAGNOSIS — M5030 Other cervical disc degeneration, unspecified cervical region: Secondary | ICD-10-CM | POA: Diagnosis not present

## 2021-05-22 DIAGNOSIS — Z87898 Personal history of other specified conditions: Secondary | ICD-10-CM

## 2021-05-22 DIAGNOSIS — J309 Allergic rhinitis, unspecified: Secondary | ICD-10-CM | POA: Diagnosis not present

## 2021-05-22 DIAGNOSIS — R7301 Impaired fasting glucose: Secondary | ICD-10-CM

## 2021-05-22 DIAGNOSIS — M170 Bilateral primary osteoarthritis of knee: Secondary | ICD-10-CM

## 2021-05-22 DIAGNOSIS — I779 Disorder of arteries and arterioles, unspecified: Secondary | ICD-10-CM | POA: Diagnosis not present

## 2021-05-22 DIAGNOSIS — N3943 Post-void dribbling: Secondary | ICD-10-CM

## 2021-05-22 MED ORDER — DOXYCYCLINE HYCLATE 100 MG PO TABS: 100.0000 mg | ORAL_TABLET | Freq: Two times a day (BID) | ORAL | 0 refills | Status: DC

## 2021-05-22 NOTE — Progress Notes (Signed)
Subjective:  Matthew Mckay is a 81 y.o. male who presents for Chief Complaint  Patient presents with   med check    Med check and discuss [place]. Has another head cold.      Here with daughter Matthew Mckay.  Here for med check.  He has a history of hypertension, hyperlipidemia, chronic constipation, microalbuminuria, impaired fasting glucose, degenerative disc disease, coronary artery disease, decreased hearing, urinary incontinence, history of prostate cancer, allergies, chronic sinus issues  Hypertension-compliant with verapamil to 40 mg daily, lisinopril 20 mg daily.  Hyperlipidemia-compliant with atorvastatin 10 mg daily  Allergies-continues on Xyzal 5 mg daily, Flonase daily, Patanol eyedrops as needed. Having some flare up the last 2 weeks.  Using some short term mucinex DM.  Was on antibiotic recent per Dr. Redmond School here.  Here to discuss recent scan. No current abdominal pain, no recent bowel issues.  Drinks plenty of water, diet cokes.     No other aggravating or relieving factors.    No other c/o.  Past Medical History:  Diagnosis Date   Allergy    Anxiety    Chronic sinusitis    Diverticulosis    sigmoid colon and increased vascularization due to prior radiation, per 2013 colonoscopy   GERD (gastroesophageal reflux disease)    Hemorrhoid    Hyperlipidemia    Hypertension    Prostate cancer ([place])    history of radiation therapy   Seasonal allergies    Urinary retention 2019   Current Outpatient Medications on File Prior to Visit  Medication Sig Dispense Refill   atorvastatin (LIPITOR) 10 MG tablet Take 1 tablet (10 mg total) by mouth daily. 90 tablet 3   dextromethorphan-guaiFENesin (MUCINEX DM) 30-600 MG 12hr tablet Take 1 tablet by mouth 2 (two) times daily. 10 tablet 0   famotidine (PEPCID) 40 MG tablet Take 1 tablet (40 mg total) by mouth daily. 90 tablet 3   fluticasone (FLONASE) 50 MCG/ACT nasal spray SPRAY 2 SPRAYS INTO EACH NOSTRIL EVERY DAY 48 mL 11    levocetirizine (XYZAL) 5 MG tablet TAKE 1 TABLET BY MOUTH EVERY DAY IN THE EVENING 90 tablet 3   lisinopril (ZESTRIL) 20 MG tablet TAKE 1 TABLET BY MOUTH EVERY DAY 90 tablet 0   olopatadine (PATANOL) 0.1 % ophthalmic solution Place 1 drop into both eyes 2 (two) times daily. 5 mL 2   omeprazole (PRILOSEC) 40 MG capsule TAKE 1 CAPSULE BY MOUTH EVERY DAY 90 capsule 0   polyethylene glycol powder (GLYCOLAX/MIRALAX) powder 1 capfull daily in beverage 3350 g 3   verapamil (CALAN-SR) 240 MG CR tablet Take 1 tablet (240 mg total) by mouth at bedtime. 90 tablet 3   No current facility-administered medications on file prior to visit.     The following portions of the patient's history were reviewed and updated as appropriate: allergies, current medications, past family history, past medical history, past social history, past surgical history and problem list.  ROS Otherwise as in subjective above  Objective: BP 120/70    Pulse 62    Wt 173 lb 6.4 oz (78.7 kg)    BMI 25.61 kg/m   Wt Readings from Last 3 Encounters:  05/22/21 173 lb 6.4 oz (78.7 kg)  04/04/21 164 lb (74.4 kg)  02/13/21 168 lb 3.2 oz (76.3 kg)    General appearance: alert, no distress, well developed, well nourished HEENT: normocephalic, sclerae anicteric, conjunctiva pink and moist, TMs pearly, nares patent, no discharge or erythema, pharynx normal Oral cavity: MMM, no  lesions Neck: supple, no lymphadenopathy, no thyromegaly, no masses Heart: RRR, normal S1, S2, no murmurs Lungs: CTA bilaterally, no wheezes, rhonchi, or rales Abdomen: +bs, soft, non tender, non distended, no masses, no hepatomegaly, no splenomegaly Pulses: 2+ radial pulses, 2+ pedal pulses, normal cap refill Ext: no edema Slight resting and motion tremor, no over pointing, no widened gait. Otherwise cn2-12 intact, normal strength, sensation, DTRs Psych: pleasant, answers questions appropriately   Assessment: Encounter Diagnoses  Name Primary?    Essential hypertension Yes   Urinary incontinence, unspecified type    Chronic constipation    Chronic allergic rhinitis    DDD (degenerative disc disease), cervical    Abdominal pain, unspecified abdominal location    Benign prostatic hyperplasia with post-void dribbling    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 (HCC)    Osteoarthritis of both knees, unspecified osteoarthritis type    Microalbuminuria    Impaired fasting blood sugar    Hyperlipidemia, unspecified hyperlipidemia type    History of urinary retention    History of prostate cancer    High risk medication use    Gastroesophageal reflux disease, unspecified whether esophagitis present    Tremor    Pancreas cyst    Renal cyst    Abnormal CT of the abdomen      Plan: Hypertension Continue Lisinopril 20mg  daily Continue Verapamil 240 mg daily  Urinary incontinence, history of prostate cancer  Continue routine follow up with urology  Chronic constipation - no recent concerns  Chronic allergies Continue daily xyzal allergy medication, Flonase nasal Continue with routine follow up with allergist  GERD Continue Omeprazole and Pepcid daily Avoid GERD triggers  Microalbuminuria  Continue Lisinopril for renal protection  Weight loss, abdominal pain - resolved  Abnormal CT abdomen pelvis, 05/02/21 Recent findings of benign renal cysts Recent findings or pancreas cyst - radiology advised repeat in 2 years, felt to be benign  Atherosclerosis, high cholesterol Continue Atorvastatin Lipitor 10mg  daily  Sinus issues begin round of Doxycycline antibiotic x 1 week Follow up with allergist every 6-12 months   Screen for heart disease  If agreeable, lets refer to cardiology for baseline evaluation Also consider CT coronary calcium score test   Tremor - been present for a while, but worsening per patient.  Labs today, referral to neurology  Vaccine counseling  I recommend Shingrix vaccine, Prevnar  20 pneumococcal vaccine, yearly flu shot  Shingles vaccine:  I recommend you have a shingles vaccine to help prevent shingles or herpes zoster outbreak.   Please call your insurer to inquire about coverage for the Shingrix vaccine given in 2 doses.   Some insurers cover this vaccine after age 63, some cover this after age 89.  If your insurer covers this, then call to schedule appointment to have this vaccine here.  Check insurance coverage for Prevnar 20 as well   Advanced Directives: I recommend you consider completing a [place] and Living Will.   These documents respect your wishes and help alleviate burdens on your loved ones if you were to become terminally ill or be in a position to need those documents enforced.    You can complete Advanced Directives yourself, have them notarized, then have copies made for our office, for you and for anybody you feel should have them in safe keeping.  Or, you can have an attorney prepare these documents.   If you haven't updated your Last Will and Testament in a while, it may be worthwhile  having an attorney prepare these documents together and save on some costs.       Gianni was seen today for med check.  Diagnoses and all orders for this visit:  Essential hypertension  Urinary incontinence, unspecified type  Chronic constipation  Chronic allergic rhinitis  DDD (degenerative disc disease), cervical  Abdominal pain, unspecified abdominal location  Benign prostatic hyperplasia with post-void dribbling  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 (HCC)  Osteoarthritis of both knees, unspecified osteoarthritis type  Microalbuminuria  Impaired fasting blood sugar -     Hemoglobin A1c  Hyperlipidemia, unspecified hyperlipidemia type -     Lipid panel  History of urinary retention  History of prostate cancer  High risk medication use  Gastroesophageal reflux disease, unspecified whether esophagitis  present  Tremor -     CBC -     TSH -     Ambulatory referral to Neurology -     Vitamin B12  Pancreas cyst  Renal cyst  Abnormal CT of the abdomen  Other orders -     doxycycline (VIBRA-TABS) 100 MG tablet; Take 1 tablet (100 mg total) by mouth 2 (two) times daily.  Spent > 45 minutes face to face with patient in discussion of symptoms, evaluation, plan and recommendations.    Follow up: pending labs

## 2021-05-22 NOTE — Patient Instructions (Signed)
Hypertension Continue Lisinopril 20mg  daily Continue Verapamil 240 mg daily  Urinary incontinence, history of prostate cancer  Continue routine follow up with urology  Chronic constipation - no recent concerns  Chronic allergies Continue daily xyzal allergy medication, Flonase nasal Continue with routine follow up with allergist  GERD Continue Omeprazole and Pepcid daily Avoid GERD triggers  Microalbuminuria  Continue Lisinopril for renal protection  Weight loss, abdominal pain - resolved  Abnormal CT abdomen pelvis, 05/02/21 Recent findings of benign renal cysts Recent findings or pancreas cyst - radiology advised repeat in 2 years, felt to be benign  Atherosclerosis, high cholesterol Continue Atorvastatin Lipitor 10mg  daily  Sinus issues begin round of Doxycycline antibiotic x 1 week Follow up with allergist every 6-12 months   Screen for heart disease  If agreeable, lets refer to cardiology for baseline evaluation Also consider CT coronary calcium score test   Tremor - been present for a while, but worsening per patient.  Labs today, referral to neurology  Vaccine counseling  I recommend Shingrix vaccine, Prevnar 20 pneumococcal vaccine, yearly flu shot  Shingles vaccine:  I recommend you have a shingles vaccine to help prevent shingles or herpes zoster outbreak.   Please call your insurer to inquire about coverage for the Shingrix vaccine given in 2 doses.   Some insurers cover this vaccine after age 59, some cover this after age 22.  If your insurer covers this, then call to schedule appointment to have this vaccine here.  Check insurance coverage for Prevnar 20 as well   Advanced Directives: I recommend you consider completing a [place] and Living Will.   These documents respect your wishes and help alleviate burdens on your loved ones if you were to become terminally ill or be in a position to need those documents enforced.    You can  complete Advanced Directives yourself, have them notarized, then have copies made for our office, for you and for anybody you feel should have them in safe keeping.  Or, you can have an attorney prepare these documents.   If you haven't updated your Last Will and Testament in a while, it may be worthwhile having an attorney prepare these documents together and save on some costs.         Patient Active Problem List   Diagnosis Date Noted   Pancreas cyst 05/22/2021   Renal cyst 05/22/2021   Abnormal CT of the abdomen 05/22/2021   Benign prostatic hyperplasia with post-void dribbling 02/13/2021   Bloating 02/13/2021   Abdominal pain 02/13/2021   Needs flu shot 02/13/2021   Weight loss 02/13/2021   Change in bowel function 02/13/2021   Sinus pressure 02/03/2021   Pain of left heel 02/03/2021   Respiratory tract infection 02/03/2021   History of urinary retention 02/03/2021   Food poisoning 01/09/2021   Non-intractable vomiting 01/09/2021   Diarrhea 01/09/2021   Dry mouth 01/09/2021   Skin cancer 01/09/2021   External hemorrhoid 01/09/2021   Chronic allergic rhinitis 11/12/2020   Dysuria 04/05/2020   Acute non-recurrent frontal sinusitis 04/05/2020   Acute cystitis without hematuria 04/05/2020   Urinary incontinence 04/05/2020   Balanitis 04/05/2020   Burning with urination 04/05/2020   Chronic constipation 09/22/2019   Microalbuminuria 09/22/2019   Impaired fasting blood sugar 09/22/2019   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 ([place]) 09/22/2019   DDD (degenerative disc disease), cervical 09/22/2019   Diverticulosis 09/22/2019   Impacted cerumen of right ear 09/22/2019   Hearing decreased,  right 09/22/2019   Frequent urination 01/20/2019   History of compression fracture of spine 05/26/2018   [place] of foot 05/26/2018   Urinary retention 05/26/2018   Tremor 05/26/2018   Skin lesion 02/18/2017   Hyperlipidemia 02/18/2017   High risk medication use 09/24/2016    Advanced directives, counseling/discussion 07/30/2016   Allergic conjunctivitis, bilateral 07/30/2016   Osteoarthritis of both knees 07/30/2016   GERD (gastroesophageal reflux disease) 08/08/2015   Essential hypertension 02/12/2015   Encounter for health maintenance examination in adult 02/12/2015   Other allergic rhinitis 02/12/2015   History of prostate cancer 02/12/2015   Need for prophylactic vaccination against Streptococcus pneumoniae (pneumococcus) 02/12/2015   Vaccine counseling 02/12/2015   Need for influenza vaccination 03/27/2011    Current Outpatient Medications on File Prior to Visit  Medication Sig Dispense Refill   atorvastatin (LIPITOR) 10 MG tablet Take 1 tablet (10 mg total) by mouth daily. 90 tablet 3   dextromethorphan-guaiFENesin (MUCINEX DM) 30-600 MG 12hr tablet Take 1 tablet by mouth 2 (two) times daily. 10 tablet 0   famotidine (PEPCID) 40 MG tablet Take 1 tablet (40 mg total) by mouth daily. 90 tablet 3   fluticasone (FLONASE) 50 MCG/ACT nasal spray SPRAY 2 SPRAYS INTO EACH NOSTRIL EVERY DAY 48 mL 11   levocetirizine (XYZAL) 5 MG tablet TAKE 1 TABLET BY MOUTH EVERY DAY IN THE EVENING 90 tablet 3   lisinopril (ZESTRIL) 20 MG tablet TAKE 1 TABLET BY MOUTH EVERY DAY 90 tablet 0   olopatadine (PATANOL) 0.1 % ophthalmic solution Place 1 drop into both eyes 2 (two) times daily. 5 mL 2   omeprazole (PRILOSEC) 40 MG capsule TAKE 1 CAPSULE BY MOUTH EVERY DAY 90 capsule 0   polyethylene glycol powder (GLYCOLAX/MIRALAX) powder 1 capfull daily in beverage 3350 g 3   verapamil (CALAN-SR) 240 MG CR tablet Take 1 tablet (240 mg total) by mouth at bedtime. 90 tablet 3   No current facility-administered medications on file prior to visit.

## 2021-05-23 LAB — CBC
Hematocrit: 38.8 % (ref 37.5–51.0)
Hemoglobin: 13.6 g/dL (ref 13.0–17.7)
MCH: 30.4 pg (ref 26.6–33.0)
MCHC: 35.1 g/dL (ref 31.5–35.7)
MCV: 87 fL (ref 79–97)
Platelets: 216 10*3/uL (ref 150–450)
RBC: 4.47 x10E6/uL (ref 4.14–5.80)
RDW: 12.1 % (ref 11.6–15.4)
WBC: 9 10*3/uL (ref 3.4–10.8)

## 2021-05-23 LAB — LIPID PANEL
Chol/HDL Ratio: 3.7 ratio (ref 0.0–5.0)
Cholesterol, Total: 131 mg/dL (ref 100–199)
HDL: 35 mg/dL — ABNORMAL LOW (ref >39)
LDL Chol Calc (NIH): 73 mg/dL (ref 0–99)
Triglycerides: 129 mg/dL (ref 0–149)
VLDL Cholesterol Cal: 23 mg/dL (ref 5–40)

## 2021-05-23 LAB — TSH: TSH: 0.832 u[IU]/mL (ref 0.450–4.500)

## 2021-05-23 LAB — HEMOGLOBIN A1C
Est. average glucose Bld gHb Est-mCnc: 123 mg/dL
Hgb A1c MFr Bld: 5.9 % — ABNORMAL HIGH (ref 4.8–5.6)

## 2021-05-23 LAB — VITAMIN B12: Vitamin B-12: 648 pg/mL (ref 232–1245)

## 2021-07-10 DIAGNOSIS — H0101A Ulcerative blepharitis right eye, upper and lower eyelids: Secondary | ICD-10-CM | POA: Diagnosis not present

## 2021-07-10 DIAGNOSIS — H02112 Cicatricial ectropion of right lower eyelid: Secondary | ICD-10-CM | POA: Diagnosis not present

## 2021-07-10 DIAGNOSIS — H0101B Ulcerative blepharitis left eye, upper and lower eyelids: Secondary | ICD-10-CM | POA: Diagnosis not present

## 2021-07-10 DIAGNOSIS — H524 Presbyopia: Secondary | ICD-10-CM | POA: Diagnosis not present

## 2021-07-10 DIAGNOSIS — H2513 Age-related nuclear cataract, bilateral: Secondary | ICD-10-CM | POA: Diagnosis not present

## 2021-07-10 DIAGNOSIS — H5203 Hypermetropia, bilateral: Secondary | ICD-10-CM | POA: Diagnosis not present

## 2021-07-11 ENCOUNTER — Other Ambulatory Visit: Payer: Self-pay | Admitting: Medical

## 2021-07-23 ENCOUNTER — Other Ambulatory Visit: Payer: Self-pay | Admitting: Medical

## 2021-07-23 MED ORDER — OMEPRAZOLE 40 MG PO CPDR: DELAYED_RELEASE_CAPSULE | ORAL | 0 refills | Status: DC

## 2021-07-23 NOTE — Telephone Encounter (Signed)
done

## 2021-07-23 NOTE — Addendum Note (Signed)
Addended by: Minette Headland A on: 07/23/2021 02:24 PM ? ? Modules accepted: Orders ? ?

## 2021-08-13 ENCOUNTER — Encounter: Payer: Self-pay | Admitting: Medical

## 2021-08-19 ENCOUNTER — Ambulatory Visit (INDEPENDENT_AMBULATORY_CARE_PROVIDER_SITE_OTHER): Payer: Medicare Other | Admitting: Physician Assistant

## 2021-08-19 ENCOUNTER — Encounter: Payer: Self-pay | Admitting: Physician Assistant

## 2021-08-19 VITALS — BP 110/60 | HR 64 | Temp 98.5°F | Ht 69.0 in | Wt 169.4 lb

## 2021-08-19 DIAGNOSIS — B9689 Other specified bacterial agents as the cause of diseases classified elsewhere: Secondary | ICD-10-CM

## 2021-08-19 DIAGNOSIS — H109 Unspecified conjunctivitis: Secondary | ICD-10-CM | POA: Diagnosis not present

## 2021-08-19 DIAGNOSIS — J309 Allergic rhinitis, unspecified: Secondary | ICD-10-CM

## 2021-08-19 DIAGNOSIS — J301 Allergic rhinitis due to pollen: Secondary | ICD-10-CM

## 2021-08-19 DIAGNOSIS — H1013 Acute atopic conjunctivitis, bilateral: Secondary | ICD-10-CM

## 2021-08-19 MED ORDER — CIPROFLOXACIN HCL 0.3 % OP SOLN: 1.0000 [drp] | OPHTHALMIC | 0 refills | Status: AC

## 2021-08-19 MED ORDER — CIPROFLOXACIN HCL 0.3 % OP SOLN: 1.0000 [drp] | OPHTHALMIC | 0 refills | Status: DC

## 2021-08-19 NOTE — Progress Notes (Signed)
? ?Acute Office Visit ? ?Subjective:  ? ? Patient ID: Matthew Mckay, male    DOB: 24-Dec-1940, 81 y.o.   MRN: [id number] ? ?Chief Complaint  ?Patient presents with  ? Acute Visit  ?  Bad allergy symptoms. 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  ? ? ?HPI ?Patient is in today for increasing symptoms of allergic rhinitis; reports that he is outside a lot, is taking Xyzal and using Flonase, but still has a lot of sneezing, runny nose, and red, itchy eyes; has been using an OTC allergy eye drop, but for the past 3 mornings has had crust in his eyes when he wakes up; reports that his last eye doctor evaluation was about 2 months ago in [place], [place]; reports that he's has a right lower eye lid droop for a while that happened after a skin cancer treatment on his face. ? ? ?Outpatient Medications Prior to Visit  ?Medication Sig Dispense Refill  ? atorvastatin (LIPITOR) 10 MG tablet Take 1 tablet (10 mg total) by mouth daily. 90 tablet 3  ? dextromethorphan-guaiFENesin (MUCINEX DM) 30-600 MG 12hr tablet Take 1 tablet by mouth 2 (two) times daily. 10 tablet 0  ? doxycycline (VIBRA-TABS) 100 MG tablet Take 1 tablet (100 mg total) by mouth 2 (two) times daily. 14 tablet 0  ? famotidine (PEPCID) 40 MG tablet Take 1 tablet (40 mg total) by mouth daily. 90 tablet 3  ? fluticasone (FLONASE) 50 MCG/ACT nasal spray SPRAY 2 SPRAYS INTO EACH NOSTRIL EVERY DAY 48 mL 11  ? levocetirizine (XYZAL) 5 MG tablet TAKE 1 TABLET BY MOUTH EVERY DAY IN THE EVENING 90 tablet 3  ? lisinopril (ZESTRIL) 20 MG tablet TAKE 1 TABLET BY MOUTH EVERY DAY 90 tablet 0  ? olopatadine (PATANOL) 0.1 % ophthalmic solution Place 1 drop into both eyes 2 (two) times daily. 5 mL 2  ? omeprazole (PRILOSEC) 40 MG capsule TAKE 1 CAPSULE BY MOUTH EVERY DAY 90 capsule 0  ? polyethylene glycol powder (GLYCOLAX/MIRALAX) powder 1 capfull daily in beverage 3350 g 3  ? verapamil (CALAN-SR) 240 MG CR tablet Take 1 tablet (240 mg  total) by mouth at bedtime. 90 tablet 3  ? ?No facility-administered medications prior to visit.  ? ? ?No Known Allergies ? ?Review of Systems  ?Constitutional:  Negative for activity change, chills, fatigue and fever.  ?HENT:  Negative for congestion, ear pain, hearing loss and voice change.   ?Eyes:  Positive for discharge, redness and itching. Negative for pain and visual disturbance.  ?Respiratory:  Negative for cough and shortness of breath.   ?Cardiovascular:  Negative for leg swelling.  ?Gastrointestinal:  Negative for constipation, diarrhea, nausea and vomiting.  ?Endocrine: Negative for polyuria.  ?Genitourinary:  Negative for flank pain and frequency.  ?Musculoskeletal:  Negative for joint swelling and neck pain.  ?Skin:  Negative for rash.  ?Neurological:  Negative for dizziness.  ?Hematological:  Does not bruise/bleed easily.  ?Psychiatric/Behavioral:  Negative for agitation and behavioral problems.   ? ?   ?Objective:  ?  ?Physical Exam ?Vitals and nursing note reviewed.  ?Constitutional:   ?   General: He is not in acute distress. ?   Appearance: Normal appearance.  ?HENT:  ?   Head: Normocephalic and atraumatic.  ?   Right Ear: External ear normal.  ?   Left Ear: External ear normal.  ?   Nose: No congestion.  ?Eyes:  ?  General: Vision grossly intact.     ?   Right eye: Discharge present.  ?   Conjunctiva/sclera:  ?   Right eye: Right conjunctiva is injected.  ?   Left eye: Left conjunctiva is injected.  ?   Pupils: Pupils are equal, round, and reactive to light.  ?Cardiovascular:  ?   Rate and Rhythm: Normal rate and regular rhythm.  ?   Pulses: Normal pulses.  ?   Heart sounds: Normal heart sounds.  ?Pulmonary:  ?   Effort: Pulmonary effort is normal.  ?   Breath sounds: Normal breath sounds. No wheezing.  ?Abdominal:  ?   General: Bowel sounds are normal.  ?   Palpations: Abdomen is soft.  ?Musculoskeletal:     ?   General: Normal range of motion.  ?   Cervical back: Normal range of motion and  neck supple.  ?   Right lower leg: No edema.  ?   Left lower leg: No edema.  ?Skin: ?   General: Skin is warm and dry.  ?   Findings: No rash.  ?Neurological:  ?   Mental Status: He is alert and oriented to person, place, and time.  ?   Gait: Gait normal.  ?Psychiatric:     ?   Mood and Affect: Mood normal.     ?   Behavior: Behavior normal.  ? ? ?BP 110/60   Pulse 64   Temp 98.5 ?F (36.9 ?C)   Wt 169 lb 6.4 oz (76.8 kg)   SpO2 97%   BMI 25.02 kg/m?  ? ?Wt Readings from Last 3 Encounters:  ?08/19/21 169 lb 6.4 oz (76.8 kg)  ?05/22/21 173 lb 6.4 oz (78.7 kg)  ?04/04/21 164 lb (74.4 kg)  ? ? ?Results for orders placed or performed in visit on 05/22/21  ?Hemoglobin A1c  ?Result Value Ref Range  ? Hgb A1c MFr Bld 5.9 (H) 4.8 - 5.6 %  ? Est. average glucose Bld gHb Est-mCnc 123 mg/dL  ?Lipid panel  ?Result Value Ref Range  ? Cholesterol, Total 131 100 - 199 mg/dL  ? Triglycerides 129 0 - 149 mg/dL  ? HDL 35 (L) >39 mg/dL  ? VLDL Cholesterol Cal 23 5 - 40 mg/dL  ? LDL Chol Calc (NIH) 73 0 - 99 mg/dL  ? Chol/HDL Ratio 3.7 0.0 - 5.0 ratio  ?CBC  ?Result Value Ref Range  ? WBC 9.0 3.4 - 10.8 x10E3/uL  ? RBC 4.47 4.14 - 5.80 x10E6/uL  ? Hemoglobin 13.6 13.0 - 17.7 g/dL  ? Hematocrit 38.8 37.5 - 51.0 %  ? MCV 87 79 - 97 fL  ? MCH 30.4 26.6 - 33.0 pg  ? MCHC 35.1 31.5 - 35.7 g/dL  ? RDW 12.1 11.6 - 15.4 %  ? Platelets 216 150 - 450 x10E3/uL  ?TSH  ?Result Value Ref Range  ? TSH 0.832 0.450 - 4.500 uIU/mL  ?Vitamin B12  ?Result Value Ref Range  ? Vitamin B-12 648 232 - 1,245 pg/mL  ? ? ?   ?Assessment & Plan:  ?1. Bacterial conjunctivitis of both eyes ? ?2. Non-seasonal allergic rhinitis due to pollen ? ? ? ?Meds ordered this encounter  ?Medications  ? DISCONTD: ciprofloxacin (CILOXAN) 0.3 % ophthalmic solution  ?  Sig: Place 1 drop into both eyes every 4 (four) hours while awake. Administer 1 drop, every 2 hours, while awake, for 2 days. Then 1 drop, every 4 hours, while awake, for the next 5 days.  ?  Dispense:  5 mL  ?   Refill:  0  ?  Order Specific Question:   Supervising Provider  ?  Answer:   Denita Lung [[id number]]  ? ciprofloxacin (CILOXAN) 0.3 % ophthalmic solution  ?  Sig: Place 1 drop into both eyes every 4 (four) hours while awake for 7 days. Administer 1 drop, every 4 hours, while awake, for the next 7 days.  ?  Dispense:  5 mL  ?  Refill:  0  ?  Order Specific Question:   Supervising Provider  ?  Answer:   Denita Lung [[id number]]  ? ? ?Return for Return as Already Scheduled. ? ?Irene Pap, PA-C ?

## 2021-08-20 ENCOUNTER — Encounter: Payer: Self-pay | Admitting: Physician Assistant

## 2021-09-09 ENCOUNTER — Other Ambulatory Visit: Payer: Self-pay | Admitting: Medical

## 2021-10-07 ENCOUNTER — Telehealth: Payer: Self-pay | Admitting: Medical

## 2021-10-07 NOTE — Telephone Encounter (Signed)
Left message for patient to call back and schedule Medicare Annual Wellness Visit (AWV) either virtually or in office. I left my number for patient to call [phone].  Last AWV  05/26/18 ; please schedule at anytime with health coach  I wanted to see if patient would schedule AWV with [place] prior to 12/12/21 appt with provider

## 2021-10-09 DIAGNOSIS — H02112 Cicatricial ectropion of right lower eyelid: Secondary | ICD-10-CM | POA: Diagnosis not present

## 2021-10-09 DIAGNOSIS — H109 Unspecified conjunctivitis: Secondary | ICD-10-CM | POA: Diagnosis not present

## 2021-10-09 DIAGNOSIS — C441122 Basal cell carcinoma of skin of right lower eyelid, including canthus: Secondary | ICD-10-CM | POA: Diagnosis not present

## 2021-10-09 DIAGNOSIS — H16211 Exposure keratoconjunctivitis, right eye: Secondary | ICD-10-CM | POA: Diagnosis not present

## 2021-10-09 DIAGNOSIS — D485 Neoplasm of uncertain behavior of skin: Secondary | ICD-10-CM | POA: Diagnosis not present

## 2021-10-09 DIAGNOSIS — H04203 Unspecified epiphora, bilateral lacrimal glands: Secondary | ICD-10-CM | POA: Diagnosis not present

## 2021-10-09 DIAGNOSIS — L570 Actinic keratosis: Secondary | ICD-10-CM | POA: Diagnosis not present

## 2021-10-10 ENCOUNTER — Ambulatory Visit (INDEPENDENT_AMBULATORY_CARE_PROVIDER_SITE_OTHER): Payer: Medicare Other

## 2021-10-10 VITALS — Ht 69.0 in | Wt 180.0 lb

## 2021-10-10 DIAGNOSIS — Z0000 Encounter for general adult medical examination without abnormal findings: Secondary | ICD-10-CM | POA: Diagnosis not present

## 2021-10-13 ENCOUNTER — Other Ambulatory Visit: Payer: Self-pay | Admitting: Medical

## 2021-12-12 ENCOUNTER — Ambulatory Visit (INDEPENDENT_AMBULATORY_CARE_PROVIDER_SITE_OTHER): Payer: Medicare Other | Admitting: Medical

## 2021-12-12 VITALS — BP 120/70 | HR 65 | Ht 68.0 in | Wt 174.0 lb

## 2021-12-12 DIAGNOSIS — H1013 Acute atopic conjunctivitis, bilateral: Secondary | ICD-10-CM

## 2021-12-12 DIAGNOSIS — R32 Unspecified urinary incontinence: Secondary | ICD-10-CM

## 2021-12-12 DIAGNOSIS — M79672 Pain in left foot: Secondary | ICD-10-CM

## 2021-12-12 DIAGNOSIS — I10 Essential (primary) hypertension: Secondary | ICD-10-CM

## 2021-12-12 DIAGNOSIS — Z7189 Other specified counseling: Secondary | ICD-10-CM

## 2021-12-12 DIAGNOSIS — N3943 Post-void dribbling: Secondary | ICD-10-CM

## 2021-12-12 DIAGNOSIS — Z0000 Encounter for general adult medical examination without abnormal findings: Secondary | ICD-10-CM

## 2021-12-12 DIAGNOSIS — J309 Allergic rhinitis, unspecified: Secondary | ICD-10-CM

## 2021-12-12 DIAGNOSIS — H9191 Unspecified hearing loss, right ear: Secondary | ICD-10-CM | POA: Diagnosis not present

## 2021-12-12 DIAGNOSIS — K219 Gastro-esophageal reflux disease without esophagitis: Secondary | ICD-10-CM | POA: Diagnosis not present

## 2021-12-12 DIAGNOSIS — I779 Disorder of arteries and arterioles, unspecified: Secondary | ICD-10-CM

## 2021-12-12 DIAGNOSIS — R7301 Impaired fasting glucose: Secondary | ICD-10-CM

## 2021-12-12 DIAGNOSIS — Z79899 Other long term (current) drug therapy: Secondary | ICD-10-CM | POA: Diagnosis not present

## 2021-12-12 DIAGNOSIS — N401 Enlarged prostate with lower urinary tract symptoms: Secondary | ICD-10-CM

## 2021-12-12 DIAGNOSIS — Z87898 Personal history of other specified conditions: Secondary | ICD-10-CM

## 2021-12-12 DIAGNOSIS — Z8781 Personal history of (healed) traumatic fracture: Secondary | ICD-10-CM

## 2021-12-12 DIAGNOSIS — Z8546 Personal history of malignant neoplasm of prostate: Secondary | ICD-10-CM

## 2021-12-12 DIAGNOSIS — J3089 Other allergic rhinitis: Secondary | ICD-10-CM

## 2021-12-12 DIAGNOSIS — M5030 Other cervical disc degeneration, unspecified cervical region: Secondary | ICD-10-CM

## 2021-12-12 DIAGNOSIS — R339 Retention of urine, unspecified: Secondary | ICD-10-CM

## 2021-12-12 DIAGNOSIS — R251 Tremor, unspecified: Secondary | ICD-10-CM

## 2021-12-12 DIAGNOSIS — K5790 Diverticulosis of intestine, part unspecified, without perforation or abscess without bleeding: Secondary | ICD-10-CM

## 2021-12-12 DIAGNOSIS — M170 Bilateral primary osteoarthritis of knee: Secondary | ICD-10-CM

## 2021-12-12 DIAGNOSIS — E785 Hyperlipidemia, unspecified: Secondary | ICD-10-CM

## 2021-12-12 DIAGNOSIS — Z7185 Encounter for immunization safety counseling: Secondary | ICD-10-CM

## 2021-12-12 LAB — POCT URINALYSIS DIP (PROADVANTAGE DEVICE)
Bilirubin, UA: NEGATIVE
Blood, UA: NEGATIVE
Glucose, UA: NEGATIVE mg/dL
Ketones, POC UA: NEGATIVE mg/dL
Leukocytes, UA: NEGATIVE
Nitrite, UA: NEGATIVE
Protein Ur, POC: NEGATIVE mg/dL
Specific Gravity, Urine: 1.015
Urobilinogen, Ur: NEGATIVE
pH, UA: 6 (ref 5.0–8.0)

## 2021-12-12 MED ORDER — ATORVASTATIN CALCIUM 10 MG PO TABS: 10.0000 mg | ORAL_TABLET | Freq: Every day | ORAL | 3 refills | Status: DC

## 2021-12-12 MED ORDER — FLUTICASONE PROPIONATE 50 MCG/ACT NA SUSP: NASAL | 11 refills | Status: DC

## 2021-12-12 MED ORDER — LEVOCETIRIZINE DIHYDROCHLORIDE 5 MG PO TABS: ORAL_TABLET | ORAL | 3 refills | Status: DC

## 2021-12-12 MED ORDER — OMEPRAZOLE 40 MG PO CPDR: DELAYED_RELEASE_CAPSULE | ORAL | 3 refills | Status: DC

## 2021-12-12 MED ORDER — OLOPATADINE HCL 0.1 % OP SOLN: 1.0000 [drp] | Freq: Two times a day (BID) | OPHTHALMIC | 11 refills | Status: DC

## 2021-12-12 MED ORDER — LISINOPRIL 20 MG PO TABS: 20.0000 mg | ORAL_TABLET | Freq: Every day | ORAL | 3 refills | Status: DC

## 2021-12-12 NOTE — Progress Notes (Signed)
Subjective:    Matthew Mckay is a 81 y.o. male who presents for Preventative Services visit and chronic medical problems/med check visit.    Chief Complaint  Patient presents with   fasting cpe    Fasting cpe, had labs drawn this morning, get hot flashes working outside- come and go. Decline flu shot-     Primary Care Provider Tysinger, Camelia Eng, PA-C here for primary care  Current Health Care Team: Dentist, in [facility] dentistry  Dr. Allyn Kenner, dermatology Dr. Dulcy Fanny Dr. Izora Gala, ENT Dr. Alexis Frock, urology Dr. Clarene Essex, [place] you may have received from other than [facility] providers in the past year (date may be approximate) Dr. Nevada Crane- Dermatology   Exercise Current exercise habits:  yard and house work    Nutrition/Diet Current diet: well balanced  Depression Screen    12/12/2021   10:29 AM  Depression screen PHQ 2/9  Decreased Interest 0  Down, Depressed, Hopeless 0  PHQ - 2 Score 0    Activities of Daily Living Screen/Functional Status Survey Is the patient deaf or have difficulty hearing?: No Does the patient have difficulty seeing, even when wearing glasses/contacts?: No Does the patient have difficulty concentrating, remembering, or making decisions?: No Does the patient have difficulty walking or climbing stairs?: No Does the patient have difficulty dressing or bathing?: No Does the patient have difficulty doing errands alone such as visiting a doctor's office or shopping?: No  Can patient draw a clock face showing 3:15 oclock, yes  Fall Risk Screen    12/12/2021   10:28 AM 10/10/2021    9:20 AM 05/22/2021    9:41 AM 02/13/2021   10:48 AM 01/09/2021    8:46 AM  [place] in the past year? 0 0 0 0 0  Number falls in past yr: 0 0 0 0 0  Injury with Fall? 0 0 0 0 0  Risk for fall due to : No Fall Risks Medication side effect No Fall Risks No Fall Risks No Fall Risks  Follow up Falls evaluation  completed Falls evaluation completed;Education provided;Falls prevention discussed Falls evaluation completed Falls evaluation completed Falls evaluation completed    Gait Assessment: Normal gait observed yes  Advanced directives Does patient have a [place]? No Does patient have a Living Will? No  Past Medical History:  Diagnosis Date   Allergy    Anxiety    Chronic sinusitis    Diverticulosis    sigmoid colon and increased vascularization due to prior radiation, per 2013 colonoscopy   GERD (gastroesophageal reflux disease)    Hemorrhoid    Hyperlipidemia    Hypertension    Prostate cancer [facility])    history of radiation therapy   Seasonal allergies    Urinary retention 2019    Past Surgical History:  Procedure Laterality Date   COLONOSCOPY  02/2012   sigmoid diverticulosis, polyps, Dr. Clarene Essex   CYSTOSCOPY WITH URETHRAL DILATATION N/A 02/10/2019   Procedure: CYSTOSCOPY WITH URETHRAL DILATATION,;  Surgeon: Alexis Frock, MD;  Location: [facility];  Service: Urology;  Laterality: N/A;   PROSTATECTOMY     1990s    Social History   Socioeconomic History   Marital status: Married    Spouse name: Not on file   Number of children: Not on file   Years of education: Not on file   Highest education level: Not on file  Occupational History   Not on  file  Tobacco Use   Smoking status: Never   Smokeless tobacco: Never  Vaping Use   Vaping Use: Never used  Substance and Sexual Activity   Alcohol use: No   Drug use: No   Sexual activity: Not on file  Other Topics Concern   Not on file  Social History Narrative   Lives alone. Widowed in 2009.   Handles own ADLs.   [employer] engineer, contracting, always busy, yard work, garden work, works at [employer].    Goes to [employer].   6 brothers and 3 sisters.  Originally from [place], brought up in [place].  Father is [place], mother from [place]. Has family all over the place, [place], [place], [place], [place]    Social Determinants of Health   Financial Resource Strain: Low Risk  (10/10/2021)   Overall Financial Resource Strain (CARDIA)    Difficulty of Paying Living Expenses: Not hard at all  Food Insecurity: No Food Insecurity (10/10/2021)   Hunger Vital Sign    Worried About Running Out of Food in the Last Year: Never true    Ran Out of Food in the Last Year: Never true  Transportation Needs: No Transportation Needs (10/10/2021)   PRAPARE - Hydrologist (Medical): No    Lack of Transportation (Non-Medical): No  Physical Activity: Inactive (10/10/2021)   Exercise Vital Sign    Days of Exercise per Week: 0 days    Minutes of Exercise per Session: 0 min  Stress: No Stress Concern Present (10/10/2021)   [place]    Feeling of Stress : Not at all  Social Connections: Not on file  Intimate Partner Violence: Not on file    Family History  Problem Relation Age of Onset   Cancer Brother        stomach   Arthritis Brother    Arthritis Brother    Hypertension Brother    Hypertension Brother    Hypertension Mother    Cancer Father        lung, smoked 2 ppd   Cancer Paternal Uncle        cancer   Cancer Paternal Uncle        stomach     Current Outpatient Medications:    famotidine (PEPCID) 40 MG tablet, TAKE 1 TABLET BY MOUTH EVERY DAY, Disp: 90 tablet, Rfl: 3   polyethylene glycol powder (GLYCOLAX/MIRALAX) powder, 1 capfull daily in beverage, Disp: 3350 g, Rfl: 3   verapamil (CALAN-SR) 240 MG CR tablet, TAKE 1 TABLET BY MOUTH AT BEDTIME., Disp: 90 tablet, Rfl: 3   atorvastatin (LIPITOR) 10 MG tablet, Take 1 tablet (10 mg total) by mouth daily., Disp: 90 tablet, Rfl: 3   fluticasone (FLONASE) 50 MCG/ACT nasal spray, SPRAY 2 SPRAYS INTO EACH NOSTRIL EVERY DAY, Disp: 48 mL, Rfl: 11   levocetirizine (XYZAL) 5 MG tablet, TAKE 1 TABLET BY MOUTH EVERY DAY IN THE EVENING, Disp: 90 tablet, Rfl: 3    lisinopril (ZESTRIL) 20 MG tablet, Take 1 tablet (20 mg total) by mouth daily., Disp: 90 tablet, Rfl: 3   olopatadine (PATANOL) 0.1 % ophthalmic solution, Place 1 drop into both eyes 2 (two) times daily., Disp: 5 mL, Rfl: 11   omeprazole (PRILOSEC) 40 MG capsule, TAKE 1 CAPSULE BY MOUTH EVERY DAY, Disp: 90 capsule, Rfl: 3  No Known Allergies  History reviewed: allergies, current medications, past family history, past medical history, past social history, past  surgical history and problem list  Chronic issues discussed: Allergies overall doing ok with current medications.   Compliant with medicines as usual.  He has no particular concerns today  He does have a history of urinary incontinence but occasionally wears adult diapers.  Not really having a lot of concerns right now with this   Compliant with cholesterol medicine and blood pressure medicine  No chest pain, no shortness of breath, no swelling in the legs  He handles his own finances and bills, still drives, still works at the [employer].  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  Uses topical rub that is helping.    Was trimming trees few months ago.  Wears adult diapers, and wsa reaching up to trim a tree and felt his diaper cut into left inguinal region.  Daughter [name] lives next to him.  Has 9 grandchildren, 3 sons.     Objective:      Biometrics BP 120/70   Pulse 65   Ht '5\' 8"'$  (1.727 m)   Wt 174 lb (78.9 kg)   BMI 26.46 kg/m   Wt Readings from Last 3 Encounters:  12/12/21 174 lb (78.9 kg)  10/10/21 180 lb (81.6 kg)  08/19/21 169 lb 6.4 oz (76.8 kg)    BP Readings from Last 3 Encounters:  12/12/21 120/70  08/19/21 110/60  05/22/21 120/70    Gen: wd, wn, nad Skin: No new lesions, he has some crusting of skin on the forehead behind the left ear, some crusted round raised 4 mm diameter lesions other left leg possible  AK HEENT: normocephalic, sclerae anicteric, TMs pearly, nares patent, no discharge or erythema, pharynx normal Oral cavity: MMM, no lesions Neck: supple, no lymphadenopathy, no thyromegaly, no masses, no bruits Heart: RRR, normal S1, S2, no murmurs Lungs: CTA bilaterally, no wheezes, rhonchi, or rales Abdomen: +bs, soft, bulge suggestive of diathesis recti, non tender, non distended, no masses, no hepatomegaly, no splenomegaly Musculoskeletal: nontender, no swelling, no obvious deformity Extremities: no edema, no cyanosis, no clubbing Pulses: 2+ symmetric, upper and lower extremities, normal cap refill Neurological: alert, oriented x 3, CN2-12 intact, strength normal upper extremities and lower extremities, sensation normal throughout, DTRs 2+ throughout, no cerebellar signs, gait normal Psychiatric: normal affect, behavior normal, pleasant  GU/rectal - deferred    Assessment:   Encounter Diagnoses  Name Primary?   Encounter for health maintenance examination in adult Yes   Medicare annual wellness visit, subsequent    Advanced directives, counseling/discussion    Allergic conjunctivitis, bilateral    Benign prostatic hyperplasia with post-void dribbling    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 (HCC)    Chronic allergic rhinitis    DDD (degenerative disc disease), cervical    Diverticulosis    Essential hypertension    Gastroesophageal reflux disease, unspecified whether esophagitis present    Hearing decreased, right    High risk medication use    History of compression fracture of spine    History of prostate cancer    History of urinary retention    Hyperlipidemia, unspecified hyperlipidemia type    Impaired fasting blood sugar    Osteoarthritis of both knees, unspecified osteoarthritis type    Tremor    Urinary incontinence, unspecified type    Urinary retention    Vaccine counseling    Pain of left heel    Other allergic rhinitis  Plan:   This  visit was a preventative care visit, also known as wellness visit or routine physical.   Topics typically include healthy lifestyle, diet, exercise, preventative care, vaccinations, sick and well care, proper use of emergency dept and after hours care, as well as other concerns.     Recommendations: Continue to return yearly for your annual wellness and preventative care visits.  This gives [place] a chance to discuss healthy lifestyle, exercise, vaccinations, review your chart record, and perform screenings where appropriate.  I recommend you see your eye doctor yearly for routine vision care.  I recommend you see your dentist yearly for routine dental care including hygiene visits twice yearly.   Vaccination recommendations were reviewed Immunization History  Administered Date(s) Administered   Fluad Quad(high Dose 65+) 01/20/2019, 02/08/2020, 02/13/2021   Influenza Split 03/27/2011, 01/29/2012   Influenza, High Dose Seasonal PF 01/31/2013, 01/26/2014, 01/21/2015, 01/21/2016, 02/04/2017, 01/27/2018   PFIZER(Purple Top)SARS-COV-2 Vaccination 06/11/2019, 07/05/2019, 04/11/2020   Pneumococcal Conjugate-13 02/12/2015   Pneumococcal Polysaccharide-23 03/27/2011   Td 04/09/2020   I recommend Shingles vaccine and Pneumococcal vaccine Prevnar 20.   You can get these at your pharmacy.   Screening for cancer: We generally don't screen for a lot of cancers after age 52.   I do recommend you continue seeing your dermatologist, Dr. Nevada Crane for skin cancer screening.    Bone health: Get at least 150 minutes of aerobic exercise weekly Get weight bearing exercise at least once weekly Bone density test:  A bone density test is an imaging test that uses a type of X-ray to measure the amount of calcium and other minerals in your bones. The test may be used to diagnose or screen you for a condition that causes weak or thin bones (osteoporosis), predict your risk for a broken bone (fracture), or determine  how well your osteoporosis treatment is working. The bone density test is recommended for females 93 and older, or females or males <40 if certain risk factors such as thyroid disease, long term use of steroids such as for asthma or rheumatological issues, vitamin D deficiency, estrogen deficiency, family history of osteoporosis, self or family history of fragility fracture in first degree relative.  Your bone density from 2021 was normal.   Heart health: Get at least 150 minutes of aerobic exercise weekly Limit alcohol It is important to maintain a healthy blood pressure and healthy cholesterol numbers  Heart disease screening: Screening for heart disease includes screening for blood pressure, fasting lipids, glucose/diabetes screening, BMI height to weight ratio, reviewed of smoking status, physical activity, and diet.    Goals include blood pressure 120/80 or less, maintaining a healthy lipid/cholesterol profile, preventing diabetes or keeping diabetes numbers under good control, not smoking or using tobacco products, exercising most days per week or at least 150 minutes per week of exercise, and eating healthy variety of fruits and vegetables, healthy oils, and avoiding unhealthy food choices like fried food, fast food, high sugar and high cholesterol foods.    Other tests may possibly include EKG test, CT coronary calcium score, echocardiogram, exercise treadmill stress test.   No current symptoms, and you decline any other testing currently   Medical care options: I recommend you continue to seek care here first for routine care.  We try really hard to have available appointments Monday through Friday daytime hours for sick visits, acute visits, and physicals.  Urgent care should be used for after hours and weekends for significant issues that cannot wait  till the next day.  The emergency department should be used for significant potentially life-threatening emergencies.  The emergency  department is expensive, can often have long wait times for less significant concerns, so try to utilize primary care, urgent care, or telemedicine when possible to avoid unnecessary trips to the emergency department.  Virtual visits and telemedicine have been introduced since the pandemic started in 2020, and can be convenient ways to receive medical care.  We offer virtual appointments as well to assist you in a variety of options to seek medical care.  Advanced Directives: I recommend you consider completing a [place] and Living Will.   These documents respect your wishes and help alleviate burdens on your loved ones if you were to become terminally ill or be in a position to need those documents enforced.    You can complete Advanced Directives yourself, have them notarized, then have copies made for our office, for you and for anybody you feel should have them in safe keeping.  Or, you can have an attorney prepare these documents.   If you haven't updated your Last Will and Testament in a while, it may be worthwhile having an attorney prepare these documents together and save on some costs.       Separate significant issues discussed: Hypertension-at goal, continue current medication  Hyperlipidemia-continue statin, labs were reviewed from earlier in the year  Chronic allergy issues-continue current regimen which seems to be working okay  Left heel pain-advised over-the-counter heel cups.  If over the next few weeks the symptoms continue to worsen we can refer to podiatry  History of carotid artery disease, hypertension-we discussed possibly doing some updated imaging such as carotid ultrasounds and heart testing but he declines these.  He has no symptoms of concern currently.  DDD-no recent concerns  Urinary incontinence-stable, no worsening issues.  Uses adult diapers occasionally  Tremor-mild, no worsening concerns   [place] was seen today for fasting  cpe.  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 (HCC)  Chronic allergic rhinitis  DDD (degenerative disc disease), cervical  Diverticulosis  Essential hypertension  Gastroesophageal reflux disease, unspecified whether esophagitis present  Hearing decreased, right  High risk medication use  History of compression fracture of spine  History of prostate cancer -     PSA  History of urinary retention  Hyperlipidemia, unspecified hyperlipidemia type  Impaired fasting blood sugar -     Hemoglobin A1c  Osteoarthritis of both knees, unspecified osteoarthritis type  Tremor  Urinary incontinence, unspecified type  Urinary retention  Vaccine counseling  Pain of left heel  Other allergic rhinitis -     levocetirizine (XYZAL) 5 MG tablet; TAKE 1 TABLET BY MOUTH EVERY DAY IN THE EVENING  Other orders -     omeprazole (PRILOSEC) 40 MG capsule; TAKE 1 CAPSULE BY MOUTH EVERY DAY -     lisinopril (ZESTRIL) 20 MG tablet; Take 1 tablet (20 mg total) by mouth daily. -  fluticasone (FLONASE) 50 MCG/ACT nasal spray; SPRAY 2 SPRAYS INTO EACH NOSTRIL EVERY DAY -     atorvastatin (LIPITOR) 10 MG tablet; Take 1 tablet (10 mg total) by mouth daily. -     olopatadine (PATANOL) 0.1 % ophthalmic solution; Place 1 drop into both eyes 2 (two) times daily.    Medicare Attestation A preventative services visit was completed today.  During the course of the visit the patient was educated and  counseled about appropriate screening and preventive services.  A health risk assessment was established with the patient that included a review of current medications, allergies, social history, family history, medical and preventative health history, biometrics, and preventative screenings to identify potential safety concerns or impairments.  A personalized plan was printed today for the patient's records and use.   Personalized health advice and education was given today to reduce health risks and promote self management and wellness.  Information regarding end of life planning was discussed today.  Dorothea Ogle, PA-C   12/12/2021

## 2021-12-12 NOTE — Patient Instructions (Signed)
This visit was a preventative care visit, also known as wellness visit or routine physical.   Topics typically include healthy lifestyle, diet, exercise, preventative care, vaccinations, sick and well care, proper use of emergency dept and after hours care, as well as other concerns.     Recommendations: Continue to return yearly for your annual wellness and preventative care visits.  This gives [place] a chance to discuss healthy lifestyle, exercise, vaccinations, review your chart record, and perform screenings where appropriate.  I recommend you see your eye doctor yearly for routine vision care.  I recommend you see your dentist yearly for routine dental care including hygiene visits twice yearly.   Vaccination recommendations were reviewed Immunization History  Administered Date(s) Administered   Fluad Quad(high Dose 65+) 01/20/2019, 02/08/2020, 02/13/2021   Influenza Split 03/27/2011, 01/29/2012   Influenza, High Dose Seasonal PF 01/31/2013, 01/26/2014, 01/21/2015, 01/21/2016, 02/04/2017, 01/27/2018   PFIZER(Purple Top)SARS-COV-2 Vaccination 06/11/2019, 07/05/2019, 04/11/2020   Pneumococcal Conjugate-13 02/12/2015   Pneumococcal Polysaccharide-23 03/27/2011   Td 04/09/2020   I recommend Shingles vaccine and Pneumococcal vaccine Prevnar 20.   You can get these at your pharmacy.   Screening for cancer: We generally don't screen for a lot of cancers after age 40.   I do recommend you contiue seeing your dermatolgoist, Dr. Nevada Crane for skin cancer screening.    Bone health: Get at least 150 minutes of aerobic exercise weekly Get weight bearing exercise at least once weekly Bone density test:  A bone density test is an imaging test that uses a type of X-ray to measure the amount of calcium and other minerals in your bones. The test may be used to diagnose or screen you for a condition that causes weak or thin bones (osteoporosis), predict your risk for a broken bone (fracture), or  determine how well your osteoporosis treatment is working. The bone density test is recommended for females 14 and older, or females or males <21 if certain risk factors such as thyroid disease, long term use of steroids such as for asthma or rheumatological issues, vitamin D deficiency, estrogen deficiency, family history of osteoporosis, self or family history of fragility fracture in first degree relative.  Your bone density from 2021 was normal.   Heart health: Get at least 150 minutes of aerobic exercise weekly Limit alcohol It is important to maintain a healthy blood pressure and healthy cholesterol numbers  Heart disease screening: Screening for heart disease includes screening for blood pressure, fasting lipids, glucose/diabetes screening, BMI height to weight ratio, reviewed of smoking status, physical activity, and diet.    Goals include blood pressure 120/80 or less, maintaining a healthy lipid/cholesterol profile, preventing diabetes or keeping diabetes numbers under good control, not smoking or using tobacco products, exercising most days per week or at least 150 minutes per week of exercise, and eating healthy variety of fruits and vegetables, healthy oils, and avoiding unhealthy food choices like fried food, fast food, high sugar and high cholesterol foods.    Other tests may possibly include EKG test, CT coronary calcium score, echocardiogram, exercise treadmill stress test.   No current sympotms, and you decline any other testing currently  If you want to do any other specific screening we can do carotid ultrasounds and heart testing.  Let me know if you want to pursue this    Medical care options: I recommend you continue to seek care here first for routine care.  We try really hard to have available appointments Monday through Friday daytime  hours for sick visits, acute visits, and physicals.  Urgent care should be used for after hours and weekends for significant issues  that cannot wait till the next day.  The emergency department should be used for significant potentially life-threatening emergencies.  The emergency department is expensive, can often have long wait times for less significant concerns, so try to utilize primary care, urgent care, or telemedicine when possible to avoid unnecessary trips to the emergency department.  Virtual visits and telemedicine have been introduced since the pandemic started in 2020, and can be convenient ways to receive medical care.  We offer virtual appointments as well to assist you in a variety of options to seek medical care.

## 2021-12-13 LAB — CBC
Hematocrit: 41.1 % (ref 37.5–51.0)
Hemoglobin: 13.9 g/dL (ref 13.0–17.7)
MCH: 29.3 pg (ref 26.6–33.0)
MCHC: 33.8 g/dL (ref 31.5–35.7)
MCV: 87 fL (ref 79–97)
Platelets: 221 10*3/uL (ref 150–450)
RBC: 4.75 x10E6/uL (ref 4.14–5.80)
RDW: 12 % (ref 11.6–15.4)
WBC: 9.3 10*3/uL (ref 3.4–10.8)

## 2021-12-13 LAB — COMPREHENSIVE METABOLIC PANEL
ALT: 10 IU/L (ref 0–44)
AST: 16 IU/L (ref 0–40)
Albumin/Globulin Ratio: 1.7 (ref 1.2–2.2)
Albumin: 4.3 g/dL (ref 3.7–4.7)
Alkaline Phosphatase: 59 IU/L (ref 44–121)
BUN/Creatinine Ratio: 25 — ABNORMAL HIGH (ref 10–24)
BUN: 20 mg/dL (ref 8–27)
Bilirubin Total: 0.5 mg/dL (ref 0.0–1.2)
CO2: 23 mmol/L (ref 20–29)
Calcium: 9.2 mg/dL (ref 8.6–10.2)
Chloride: 100 mmol/L (ref 96–106)
Creatinine, Ser: 0.81 mg/dL (ref 0.76–1.27)
Globulin, Total: 2.6 g/dL (ref 1.5–4.5)
Glucose: 93 mg/dL (ref 70–99)
Potassium: 4.3 mmol/L (ref 3.5–5.2)
Sodium: 138 mmol/L (ref 134–144)
Total Protein: 6.9 g/dL (ref 6.0–8.5)
eGFR: 87 mL/min/{1.73_m2} (ref >59)

## 2021-12-13 LAB — PSA: Prostate Specific Ag, Serum: 1.3 ng/mL (ref 0.0–4.0)

## 2021-12-13 LAB — HEMOGLOBIN A1C
Est. average glucose Bld gHb Est-mCnc: 128 mg/dL
Hgb A1c MFr Bld: 6.1 % — ABNORMAL HIGH (ref 4.8–5.6)

## 2021-12-15 ENCOUNTER — Other Ambulatory Visit: Payer: Self-pay | Admitting: Medical

## 2022-01-12 ENCOUNTER — Ambulatory Visit: Payer: Medicare Other | Admitting: Podiatry

## 2022-01-12 ENCOUNTER — Ambulatory Visit (INDEPENDENT_AMBULATORY_CARE_PROVIDER_SITE_OTHER): Payer: Medicare Other

## 2022-01-12 ENCOUNTER — Encounter: Payer: Self-pay | Admitting: Podiatry

## 2022-01-12 DIAGNOSIS — M722 Plantar fascial fibromatosis: Secondary | ICD-10-CM

## 2022-01-12 DIAGNOSIS — Q828 Other specified congenital malformations of skin: Secondary | ICD-10-CM | POA: Diagnosis not present

## 2022-01-12 MED ORDER — TRIAMCINOLONE ACETONIDE 10 MG/ML IJ SUSP
20.0000 mg | Freq: Once | INTRAMUSCULAR | Status: AC
  Administered 2022-01-12: 20 mg

## 2022-01-12 NOTE — Patient Instructions (Signed)

## 2022-01-13 NOTE — Progress Notes (Signed)
Subjective:   Patient ID: Matthew Mckay, male   DOB: 80 y.o.   MRN: [id number]   HPI Patient states he has a lot of pain in his left heel of approximate year duration and the right heel recently has become tender and hard to walk with.  Patient does not remember specific injury does not smoke and does like to be active and this is impeding him   Review of Systems  All other systems reviewed and are negative.       Objective:  Physical Exam Vitals and nursing note reviewed.  Constitutional:      Appearance: He is well-developed.  Pulmonary:     Effort: Pulmonary effort is normal.  Musculoskeletal:        General: Normal range of motion.  Skin:    General: Skin is warm.  Neurological:     Mental Status: He is alert.     Neurovascular status intact muscle strength found to be adequate range of motion adequate exquisite discomfort noted medial band heel left over right with inflammation fluid moderate flatfoot deformity and patient found to have good digital perfusion well oriented x3     Assessment:  Acute fasciitis-like symptomatology left over right with inflammation fluid of the medial band with moderate instability of the arch     Plan:  H&P x-rays reviewed and today I went ahead discussed condition and did sterile prep and injected the plantar fascial insertion of bilateral 3 mg Kenalog 5 mg Xylocaine and applied fascial brace left which was fitted properly to the arch to lift up the arch and take the pressure off the plantar fascia  X-rays indicate spur formation left over right no indication stress fracture moderate depression of the arch no arthritis noted

## 2022-01-26 ENCOUNTER — Ambulatory Visit: Payer: Medicare Other | Admitting: Podiatry

## 2022-01-26 DIAGNOSIS — M722 Plantar fascial fibromatosis: Secondary | ICD-10-CM

## 2022-01-26 MED ORDER — TRIAMCINOLONE ACETONIDE 10 MG/ML IJ SUSP
10.0000 mg | Freq: Once | INTRAMUSCULAR | Status: AC
  Administered 2022-01-26: 10 mg

## 2022-01-26 NOTE — Progress Notes (Signed)
Subjective:   Patient ID: Matthew Mckay, male   DOB: 81 y.o.   MRN: [id number]   HPI Patient states the left heel has been doing well but on the right heel more on the outside it is sore   ROS      Objective:  Physical Exam  Neuro vascular status intact with the inside of the right heel doing well the left heel doing well and discomfort on the outside of the right plantar fascia     Assessment:  Plan fasciitis right lateral side     Plan:  Sterile prep injected the lateral fascial band 3 mg Kenalog 5 mg Xylocaine and advised this patient on supportive shoes anti-inflammatories as needed

## 2022-01-27 ENCOUNTER — Encounter: Payer: Self-pay | Admitting: Internal Medicine

## 2022-02-16 ENCOUNTER — Other Ambulatory Visit (INDEPENDENT_AMBULATORY_CARE_PROVIDER_SITE_OTHER): Payer: Medicare Other

## 2022-02-16 DIAGNOSIS — Z23 Encounter for immunization: Secondary | ICD-10-CM | POA: Diagnosis not present

## 2022-04-23 DIAGNOSIS — H02112 Cicatricial ectropion of right lower eyelid: Secondary | ICD-10-CM | POA: Diagnosis not present

## 2022-04-23 DIAGNOSIS — H01012 Ulcerative blepharitis right lower eyelid: Secondary | ICD-10-CM | POA: Diagnosis not present

## 2022-06-11 DIAGNOSIS — H04203 Unspecified epiphora, bilateral lacrimal glands: Secondary | ICD-10-CM | POA: Diagnosis not present

## 2022-06-11 DIAGNOSIS — D485 Neoplasm of uncertain behavior of skin: Secondary | ICD-10-CM | POA: Diagnosis not present

## 2022-06-11 DIAGNOSIS — C441121 Basal cell carcinoma of skin of right upper eyelid, including canthus: Secondary | ICD-10-CM | POA: Diagnosis not present

## 2022-06-11 DIAGNOSIS — C441191 Basal cell carcinoma of skin of left upper eyelid, including canthus: Secondary | ICD-10-CM | POA: Diagnosis not present

## 2022-07-22 ENCOUNTER — Encounter (HOSPITAL_COMMUNITY): Payer: Self-pay | Admitting: Internal Medicine

## 2022-07-22 ENCOUNTER — Inpatient Hospital Stay (HOSPITAL_COMMUNITY)
Admission: EM | Admit: 2022-07-22 | Discharge: 2022-07-23 | DRG: 309 | Disposition: A | Discharge status: Home / Self Care | Payer: Medicare Other | Attending: Internal Medicine | Admitting: Internal Medicine

## 2022-07-22 ENCOUNTER — Emergency Department (HOSPITAL_COMMUNITY): Payer: Medicare Other

## 2022-07-22 ENCOUNTER — Observation Stay (HOSPITAL_COMMUNITY): Payer: Medicare Other

## 2022-07-22 ENCOUNTER — Other Ambulatory Visit: Payer: Self-pay

## 2022-07-22 DIAGNOSIS — N400 Enlarged prostate without lower urinary tract symptoms: Secondary | ICD-10-CM | POA: Diagnosis present

## 2022-07-22 DIAGNOSIS — K5909 Other constipation: Secondary | ICD-10-CM | POA: Diagnosis not present

## 2022-07-22 DIAGNOSIS — R55 Syncope and collapse: Secondary | ICD-10-CM | POA: Diagnosis not present

## 2022-07-22 DIAGNOSIS — N281 Cyst of kidney, acquired: Secondary | ICD-10-CM | POA: Diagnosis not present

## 2022-07-22 DIAGNOSIS — Z7951 Long term (current) use of inhaled steroids: Secondary | ICD-10-CM

## 2022-07-22 DIAGNOSIS — Z8261 Family history of arthritis: Secondary | ICD-10-CM

## 2022-07-22 DIAGNOSIS — Z9109 Other allergy status, other than to drugs and biological substances: Secondary | ICD-10-CM | POA: Diagnosis not present

## 2022-07-22 DIAGNOSIS — Z7901 Long term (current) use of anticoagulants: Secondary | ICD-10-CM | POA: Diagnosis not present

## 2022-07-22 DIAGNOSIS — Z8546 Personal history of malignant neoplasm of prostate: Secondary | ICD-10-CM

## 2022-07-22 DIAGNOSIS — I493 Ventricular premature depolarization: Secondary | ICD-10-CM | POA: Diagnosis present

## 2022-07-22 DIAGNOSIS — N179 Acute kidney failure, unspecified: Secondary | ICD-10-CM | POA: Diagnosis not present

## 2022-07-22 DIAGNOSIS — I700 Atherosclerosis of aorta: Secondary | ICD-10-CM | POA: Diagnosis present

## 2022-07-22 DIAGNOSIS — I480 Paroxysmal atrial fibrillation: Secondary | ICD-10-CM

## 2022-07-22 DIAGNOSIS — Z923 Personal history of irradiation: Secondary | ICD-10-CM | POA: Diagnosis not present

## 2022-07-22 DIAGNOSIS — E785 Hyperlipidemia, unspecified: Secondary | ICD-10-CM | POA: Diagnosis not present

## 2022-07-22 DIAGNOSIS — E860 Dehydration: Secondary | ICD-10-CM | POA: Diagnosis present

## 2022-07-22 DIAGNOSIS — Z743 Need for continuous supervision: Secondary | ICD-10-CM | POA: Diagnosis not present

## 2022-07-22 DIAGNOSIS — I509 Heart failure, unspecified: Secondary | ICD-10-CM | POA: Insufficient documentation

## 2022-07-22 DIAGNOSIS — R001 Bradycardia, unspecified: Secondary | ICD-10-CM | POA: Diagnosis not present

## 2022-07-22 DIAGNOSIS — I4891 Unspecified atrial fibrillation: Secondary | ICD-10-CM | POA: Diagnosis present

## 2022-07-22 DIAGNOSIS — Z8249 Family history of ischemic heart disease and other diseases of the circulatory system: Secondary | ICD-10-CM | POA: Diagnosis not present

## 2022-07-22 DIAGNOSIS — J811 Chronic pulmonary edema: Secondary | ICD-10-CM | POA: Diagnosis not present

## 2022-07-22 DIAGNOSIS — R61 Generalized hyperhidrosis: Secondary | ICD-10-CM | POA: Diagnosis not present

## 2022-07-22 DIAGNOSIS — K5900 Constipation, unspecified: Secondary | ICD-10-CM | POA: Diagnosis not present

## 2022-07-22 DIAGNOSIS — I119 Hypertensive heart disease without heart failure: Secondary | ICD-10-CM | POA: Diagnosis present

## 2022-07-22 DIAGNOSIS — Z79899 Other long term (current) drug therapy: Secondary | ICD-10-CM | POA: Diagnosis not present

## 2022-07-22 DIAGNOSIS — I4819 Other persistent atrial fibrillation: Principal | ICD-10-CM | POA: Diagnosis present

## 2022-07-22 DIAGNOSIS — I499 Cardiac arrhythmia, unspecified: Secondary | ICD-10-CM | POA: Diagnosis not present

## 2022-07-22 DIAGNOSIS — R6889 Other general symptoms and signs: Secondary | ICD-10-CM | POA: Diagnosis not present

## 2022-07-22 DIAGNOSIS — K219 Gastro-esophageal reflux disease without esophagitis: Secondary | ICD-10-CM | POA: Diagnosis not present

## 2022-07-22 DIAGNOSIS — I10 Essential (primary) hypertension: Secondary | ICD-10-CM | POA: Diagnosis present

## 2022-07-22 LAB — CBC WITH DIFFERENTIAL/PLATELET
Abs Immature Granulocytes: 0.05 10*3/uL (ref 0.00–0.07)
Basophils Absolute: 0 10*3/uL (ref 0.0–0.1)
Basophils Relative: 0 %
Eosinophils Absolute: 0.3 10*3/uL (ref 0.0–0.5)
Eosinophils Relative: 2 %
HCT: 42.5 % (ref 39.0–52.0)
Hemoglobin: 13.2 g/dL (ref 13.0–17.0)
Immature Granulocytes: 0 %
Lymphocytes Relative: 20 %
Lymphs Abs: 2.4 10*3/uL (ref 0.7–4.0)
MCH: 29.1 pg (ref 26.0–34.0)
MCHC: 31.1 g/dL (ref 30.0–36.0)
MCV: 93.6 fL (ref 80.0–100.0)
Monocytes Absolute: 0.8 10*3/uL (ref 0.1–1.0)
Monocytes Relative: 7 %
Neutro Abs: 8.2 10*3/uL — ABNORMAL HIGH (ref 1.7–7.7)
Neutrophils Relative %: 71 %
Platelets: 199 10*3/uL (ref 150–400)
RBC: 4.54 MIL/uL (ref 4.22–5.81)
RDW: 13 % (ref 11.5–15.5)
WBC: 11.7 10*3/uL — ABNORMAL HIGH (ref 4.0–10.5)
nRBC: 0 % (ref 0.0–0.2)

## 2022-07-22 LAB — COMPREHENSIVE METABOLIC PANEL
ALT: 13 U/L (ref 0–44)
AST: 18 U/L (ref 15–41)
Albumin: 3.3 g/dL — ABNORMAL LOW (ref 3.5–5.0)
Alkaline Phosphatase: 49 U/L (ref 38–126)
Anion gap: 13 (ref 5–15)
BUN: 27 mg/dL — ABNORMAL HIGH (ref 8–23)
CO2: 23 mmol/L (ref 22–32)
Calcium: 8.9 mg/dL (ref 8.9–10.3)
Chloride: 104 mmol/L (ref 98–111)
Creatinine, Ser: 1.58 mg/dL — ABNORMAL HIGH (ref 0.61–1.24)
GFR, Estimated: 43 mL/min — ABNORMAL LOW (ref >60)
Glucose, Bld: 173 mg/dL — ABNORMAL HIGH (ref 70–99)
Potassium: 4.5 mmol/L (ref 3.5–5.1)
Sodium: 140 mmol/L (ref 135–145)
Total Bilirubin: 0.6 mg/dL (ref 0.3–1.2)
Total Protein: 6.6 g/dL (ref 6.5–8.1)

## 2022-07-22 MED ORDER — LISINOPRIL 20 MG PO TABS: 20.0000 mg | ORAL_TABLET | Freq: Every day | ORAL | Status: DC

## 2022-07-22 MED ORDER — SODIUM CHLORIDE 0.9 % IV SOLN: INTRAVENOUS | Status: DC

## 2022-07-22 MED ORDER — OLOPATADINE HCL 0.1 % OP SOLN
1.0000 [drp] | Freq: Two times a day (BID) | OPHTHALMIC | Status: DC
  Administered 2022-07-23: 1 [drp] via OPHTHALMIC
  Filled 2022-07-22 (×2): qty 5

## 2022-07-22 MED ORDER — METOPROLOL TARTRATE 5 MG/5ML IV SOLN: 2.5000 mg | Freq: Four times a day (QID) | INTRAVENOUS | Status: DC | PRN

## 2022-07-22 MED ORDER — LORATADINE 10 MG PO TABS
10.0000 mg | ORAL_TABLET | Freq: Every day | ORAL | Status: DC
  Administered 2022-07-22 – 2022-07-23 (×2): 10 mg via ORAL
  Filled 2022-07-22 (×2): qty 1

## 2022-07-22 MED ORDER — FLUTICASONE PROPIONATE 50 MCG/ACT NA SUSP
2.0000 | Freq: Every day | NASAL | Status: DC
  Administered 2022-07-23: 2 via NASAL
  Filled 2022-07-22 (×2): qty 16

## 2022-07-22 MED ORDER — PANTOPRAZOLE SODIUM 40 MG PO TBEC
40.0000 mg | DELAYED_RELEASE_TABLET | Freq: Every day | ORAL | Status: DC
  Administered 2022-07-22 – 2022-07-23 (×2): 40 mg via ORAL
  Filled 2022-07-22 (×2): qty 1

## 2022-07-22 MED ORDER — ATORVASTATIN CALCIUM 10 MG PO TABS
10.0000 mg | ORAL_TABLET | Freq: Every day | ORAL | Status: DC
  Administered 2022-07-22 – 2022-07-23 (×2): 10 mg via ORAL
  Filled 2022-07-22 (×2): qty 1

## 2022-07-22 MED ORDER — FAMOTIDINE 20 MG PO TABS
40.0000 mg | ORAL_TABLET | Freq: Every day | ORAL | Status: DC
  Administered 2022-07-22 – 2022-07-23 (×2): 40 mg via ORAL
  Filled 2022-07-22 (×2): qty 2

## 2022-07-22 MED ORDER — LEVOCETIRIZINE DIHYDROCHLORIDE 5 MG PO TABS: 2.5000 mg | ORAL_TABLET | Freq: Every evening | ORAL | Status: DC

## 2022-07-22 MED ORDER — SODIUM CHLORIDE 0.9% FLUSH
3.0000 mL | Freq: Two times a day (BID) | INTRAVENOUS | Status: DC
  Administered 2022-07-22 – 2022-07-23 (×2): 3 mL via INTRAVENOUS

## 2022-07-22 MED ORDER — APIXABAN 2.5 MG PO TABS
2.5000 mg | ORAL_TABLET | Freq: Two times a day (BID) | ORAL | Status: DC
  Administered 2022-07-22: 2.5 mg via ORAL
  Filled 2022-07-22: qty 1

## 2022-07-22 NOTE — ED Notes (Signed)
Ultrasound at bedside

## 2022-07-22 NOTE — H&P (Signed)
History and Physical    Matthew Mckay [id number] DOB: 22-Jan-1941 DOA: 07/22/2022  PCP: Carlena Hurl, PA-C (Confirm with patient/family/NH records and if not entered, this has to be entered at [facility] point of entry) Patient coming from: Home  I have personally briefly reviewed patient's old medical records in [place]  Chief Complaint: I passed out  HPI: Matthew Mckay is a 82 y.o. male with medical history significant of HTN, seasonal allergies, HLD, BPH, presented with syncope.  Patient has chronic constipation and this morning while on toilet, he had to strain to mover his bowels and after that soon he became lightheaded and passed out for unknown period of time. Then he woke up, found himself still sitting on the toilet chair, he continue to feel lightheaded.  Patient managed to stand up but he started to feel very lightheaded and about to collapse again.  EMS was called and arrived and found his heart rate in the 30s.  In addition, the patient complained about worsening of seasonal allergy with stuffy nose, itchy and red eyes. Patient also complaining about occasionally feeling palpitations but never had chest pain or history of syncope before today.  ED Course: Heart rate 50s-60s, A-fib, BP was initially low SBP in  90s.  Chest x-ray showed some cardiomegaly but no pulmonary congestions.  Blood work showed AKI creatinine 1.5 compared to baseline <0.8.  Cardiology consulted and patient was started on IV fluid  Review of Systems: As per HPI otherwise 14 point review of systems negative.    Past Medical History:  Diagnosis Date   Allergy    Anxiety    Chronic sinusitis    Diverticulosis    sigmoid colon and increased vascularization due to prior radiation, per 2013 colonoscopy   GERD (gastroesophageal reflux disease)    Hemorrhoid    Hyperlipidemia    Hypertension    Prostate cancer    history of radiation therapy   Seasonal allergies    Urinary retention 2019     Past Surgical History:  Procedure Laterality Date   COLONOSCOPY  02/2012   sigmoid diverticulosis, polyps, Dr. Clarene Essex   CYSTOSCOPY WITH URETHRAL DILATATION N/A 02/10/2019   Procedure: CYSTOSCOPY WITH URETHRAL DILATATION,;  Surgeon: Alexis Frock, MD;  Location: [facility];  Service: Urology;  Laterality: N/A;   PROSTATECTOMY     1990s     reports that he has never smoked. He has never used smokeless tobacco. He reports that he does not drink alcohol and does not use drugs.  No Known Allergies  Family History  Problem Relation Age of Onset   Cancer Brother        stomach   Arthritis Brother    Arthritis Brother    Hypertension Brother    Hypertension Brother    Hypertension Mother    Cancer Father        lung, smoked 2 ppd   Cancer Paternal Uncle        cancer   Cancer Paternal Uncle        stomach    Prior to Admission medications   Medication Sig Start Date End Date Taking? Authorizing Provider  atorvastatin (LIPITOR) 10 MG tablet Take 1 tablet (10 mg total) by mouth daily. 12/12/21   Tysinger, Camelia Eng, PA-C  famotidine (PEPCID) 40 MG tablet TAKE 1 TABLET BY MOUTH EVERY DAY 09/09/21   Tysinger, Camelia Eng, PA-C  fluticasone (FLONASE) 50 MCG/ACT nasal spray SPRAY 2 SPRAYS INTO EACH NOSTRIL EVERY  DAY 12/12/21   Tysinger, Camelia Eng, PA-C  levocetirizine (XYZAL) 5 MG tablet TAKE 1 TABLET BY MOUTH EVERY DAY IN THE EVENING 12/12/21   Tysinger, Camelia Eng, PA-C  lisinopril (ZESTRIL) 20 MG tablet Take 1 tablet (20 mg total) by mouth daily. 12/12/21   Tysinger, Camelia Eng, PA-C  olopatadine (PATANOL) 0.1 % ophthalmic solution Place 1 drop into both eyes 2 (two) times daily. 12/12/21   Tysinger, Camelia Eng, PA-C  omeprazole (PRILOSEC) 40 MG capsule TAKE 1 CAPSULE BY MOUTH EVERY DAY 12/12/21   Tysinger, Camelia Eng, PA-C  polyethylene glycol powder [facility]) powder 1 capfull daily in beverage 05/26/18   Tysinger, Camelia Eng, PA-C  verapamil (CALAN-SR) 240 MG CR tablet TAKE 1 TABLET BY MOUTH AT  BEDTIME. 09/09/21   Carlena Hurl, PA-C    Physical Exam: Vitals:   07/22/22 1315 07/22/22 1330 07/22/22 1400 07/22/22 1600  BP: 97/74 133/69 137/61   Pulse: 60 (!) 59 70   Resp: 16 15 14    Temp:      TempSrc:      SpO2: 99% 100% 100%   Height:    5\' 9"  (1.753 m)    Constitutional: NAD, calm, comfortable Vitals:   07/22/22 1315 07/22/22 1330 07/22/22 1400 07/22/22 1600  BP: 97/74 133/69 137/61   Pulse: 60 (!) 59 70   Resp: 16 15 14    Temp:      TempSrc:      SpO2: 99% 100% 100%   Height:    5\' 9"  (1.753 m)   Eyes: PERRL, lids and conjunctivae normal ENMT: Mucous membranes are dry. Posterior pharynx clear of any exudate or lesions.Normal dentition.  Neck: normal, supple, no masses, no thyromegaly Respiratory: clear to auscultation bilaterally, no wheezing, no crackles. Normal respiratory effort. No accessory muscle use.  Cardiovascular: Irregular heart rate, no murmurs / rubs / gallops. No extremity edema. 2+ pedal pulses. No carotid bruits.  Abdomen: no tenderness, no masses palpated. No hepatosplenomegaly. Bowel sounds positive.  Musculoskeletal: no clubbing / cyanosis. No joint deformity upper and lower extremities. Good ROM, no contractures. Normal muscle tone.  Skin: no rashes, lesions, ulcers. No induration Neurologic: CN 2-12 grossly intact. Sensation intact, DTR normal. Strength 5/5 in all 4.  Psychiatric: Normal judgment and insight. Alert and oriented x 3. Normal mood.    Labs on Admission: I have personally reviewed following labs and imaging studies  CBC: Recent Labs  Lab 07/22/22 1300  WBC 11.7*  NEUTROABS 8.2*  HGB 13.2  HCT 42.5  MCV 93.6  PLT 123XX123   Basic Metabolic Panel: Recent Labs  Lab 07/22/22 1300  NA 140  K 4.5  CL 104  CO2 23  GLUCOSE 173*  BUN 27*  CREATININE 1.58*  CALCIUM 8.9   GFR: CrCl cannot be calculated (Unknown ideal weight.). Liver Function Tests: Recent Labs  Lab 07/22/22 1300  AST 18  ALT 13  ALKPHOS 49   BILITOT 0.6  PROT 6.6  ALBUMIN 3.3*   No results for input(s): "LIPASE", "AMYLASE" in the last 168 hours. No results for input(s): "AMMONIA" in the last 168 hours. Coagulation Profile: No results for input(s): "INR", "PROTIME" in the last 168 hours. Cardiac Enzymes: No results for input(s): "CKTOTAL", "CKMB", "CKMBINDEX", "TROPONINI" in the last 168 hours. BNP (last 3 results) No results for input(s): "PROBNP" in the last 8760 hours. HbA1C: No results for input(s): "HGBA1C" in the last 72 hours. CBG: No results for input(s): "GLUCAP" in the last 168 hours. Lipid Profile: No  results for input(s): "CHOL", "HDL", "LDLCALC", "TRIG", "CHOLHDL", "LDLDIRECT" in the last 72 hours. Thyroid Function Tests: No results for input(s): "TSH", "T4TOTAL", "FREET4", "T3FREE", "THYROIDAB" in the last 72 hours. Anemia Panel: No results for input(s): "VITAMINB12", "FOLATE", "FERRITIN", "TIBC", "IRON", "RETICCTPCT" in the last 72 hours. Urine analysis:    Component Value Date/Time   COLORURINE YELLOW 12/13/2019 [place] 12/13/2019 1531   LABSPEC 1.015 12/12/2021 1149   PHURINE 5.0 12/13/2019 1531   GLUCOSEU NEGATIVE 12/13/2019 1531   HGBUR NEGATIVE 12/13/2019 1531   BILIRUBINUR negative 12/12/2021 1149   BILIRUBINUR n 02/12/2015 0952   KETONESUR negative 12/12/2021 [place] 12/13/2019 1531   PROTEINUR negative 12/12/2021 1149   PROTEINUR NEGATIVE 12/13/2019 1531   UROBILINOGEN negative 02/12/2015 0952   UROBILINOGEN 0.2 12/15/2008 0914   NITRITE Negative 12/12/2021 1149   NITRITE NEGATIVE 12/13/2019 1531   LEUKOCYTESUR Negative 12/12/2021 1149   LEUKOCYTESUR TRACE (A) 12/13/2019 1531    Radiological Exams on Admission: DG Chest Port 1 View  Result Date: 07/22/2022 CLINICAL DATA:  Paleness, diaphoresis, lethargy, bradycardia, vagal during bowel movement EXAM: PORTABLE CHEST 1 VIEW COMPARISON:  Portable exam 1306 hours compared to 09/21/2016 FINDINGS:  Enlargement of cardiac silhouette. Mediastinal contours and pulmonary vascularity normal. Atherosclerotic calcification aorta. Lungs clear. No pulmonary infiltrate, pleural effusion, or pneumothorax. Osseous structures unremarkable. IMPRESSION: Enlargement of cardiac silhouette without acute abnormalities. Aortic Atherosclerosis (ICD10-I70.0). Electronically Signed   By: Lavonia Dana M.D.   On: 07/22/2022 13:12    EKG: Independently reviewed.  A-fib, bradycardia  Assessment/Plan Principal Problem:   Afib Active Problems:   A-fib   Syncope, vasovagal   Sinus bradycardia  (please populate well all problems here in Problem List. (For example, if patient is on BP meds at home and you resume or decide to hold them, it is a problem that needs to be her. Same for CAD, COPD, HLD and so on)   Syncope -Probably vasovagal -Other DDx, possibly also related to A-fib and bradycardia.  Continue telemonitoring for 24 hours to rule out tachybradycardia syndrome. -Check echocardiogram, check orthostatic vital signs  New onset of A-fib, rate controlled -Will only use as needed Lopressor for breakthrough heart rate at this point given the risk of recurrent bradycardia -CHADS2= 2, indication for systemic anticoagulation, no history of GI bleed, intracranial bleeding.  Start patient on Eliquis.  Monitor H&H -Check TSH and T4  AKI -Clinically appears to be volume contracted, agree with IV fluid for 1 day -Check renal ultrasound  HTN -Blood pressure borderline low, discontinue verapamil for the new onset of bradycardia -Plan to use beta-blocker or Cardizem for rate control if necessary. -Hold off lisinopril as there is AKI  DVT prophylaxis: Eliquis Code Status: Full code Family Communication: Daughter  at bedside Disposition Plan: Expect less than 2 midnight hospital stay Consults called: Cardiology Admission status: Tele obs   Lequita Halt MD [facility] Pager [phone]  07/22/2022, 4:42 PM

## 2022-07-22 NOTE — Progress Notes (Signed)
ANTICOAGULATION CONSULT NOTE - Initial Consult  Pharmacy Consult for Apixaban Indication: atrial fibrillation  No Known Allergies  Patient Measurements: Height: 5\' 9"  (175.3 cm) IBW/kg (Calculated) : 70.7  Vital Signs: Temp: 97.8 F (36.6 C) (04/03 1302) Temp Source: Oral (04/03 1302) BP: 137/61 (04/03 1400) Pulse Rate: 70 (04/03 1400)  Labs: Recent Labs    07/22/22 1300  HGB 13.2  HCT 42.5  PLT 199  CREATININE 1.58*    CrCl cannot be calculated (Unknown ideal weight.).   Medical History: Past Medical History:  Diagnosis Date   Allergy    Anxiety    Chronic sinusitis    Diverticulosis    sigmoid colon and increased vascularization due to prior radiation, per 2013 colonoscopy   GERD (gastroesophageal reflux disease)    Hemorrhoid    Hyperlipidemia    Hypertension    Prostate cancer    history of radiation therapy   Seasonal allergies    Urinary retention 2019    Medications:  (Not in a hospital admission)  Scheduled:   atorvastatin  10 mg Oral Daily   famotidine  40 mg Oral Daily   fluticasone  2 spray Each Nare Daily   loratadine  10 mg Oral Daily   olopatadine  1 drop Both Eyes BID   pantoprazole  40 mg Oral Daily   sodium chloride flush  3 mL Intravenous Q12H   Infusions:   sodium chloride      Assessment: 82 yom with a history of HTN, HLD, GERD presenting with feeling lightheaded/weak after vagal maneuver on toilet. Apixaban per pharmacy consult for AF.  82 yo; Scr 1.58 >> meets criteria for dose reduction (Note: weight is not currently charted)  Hgb 13.2; plt 199  Goal of Therapy:  Monitor platelets by anticoagulation protocol: Yes   Plan:  Apixaban 2.5mg  BID Monitor for s/s of hemorrhage Pharmacy to educate prior to discharge  Lorelei Pont, PharmD, BCPS 07/22/2022 5:10 PM ED Clinical Pharmacist -  [phone]

## 2022-07-22 NOTE — ED Notes (Signed)
Patient assisted to bedpan by other RN, this RN assisted patient off bed pan and cleaned him. Brief replaced and patient repositioned for comfort. Patient has no complaints at this time. This is second BM since patient has arrived, no changes to HR or BP noted.

## 2022-07-22 NOTE — Consult Note (Signed)
Cardiology Consultation   Patient ID: Matthew Mckay MRN: [id number]; DOB: 1941-03-10  Admit date: 07/22/2022 Date of Consult: 07/22/2022  PCP:  Carlena Hurl, PA-C   [place] Providers Cardiologist:  New Click here to update MD or APP on Care Team, Refresh:1}     Patient Profile:   Matthew Mckay is a 82 y.o. male with a hx of HTN, HLD, GERD, prostate cancer who is being seen 07/22/2022 for the evaluation of near-syncope and new onset atrial fib with bradycardia at the request of Dr. Vanita Panda.  History of Present Illness:   Mr. [name] with no prior cardiac history.  Patient states that he is followed by a local family and sports medicine clinic with routine follow up. He is on verapamil for HTN. Patient states that today at approximately 10 AM he was using the restroom and was using vagal maneuvers to defecate.  He got up to walk and started to feel weak and felt like he was about to pass out, this feeling lasted for about 20 minutes and was not accompanied with any other symptoms, postictal phase, muscle weakness or visual disturbances.  No prior history of syncope.  Patient did not fall. Patient also mentions that he does not routinely drink water and often drinks tea, diet sodas, and coffee.  This morning patient had 3 pieces of bread, with tea and coffee.  He was seen and evaluated by EMS who noted he appeared pale, cool, with HR reported to be in the 30s, irregular with rhythm strip reportedly showing afib in the 40s.  Patient has no prior history of A-fib or palpitations, shortness of breath, chest pain, leg swelling. Here in the ED, no sustained bradycardia seen, afib with HR 50s-80s. Initial BP 97/74. Orthostatics pending.  ED workup: EKG Afib HR 62, glucose 173, CXR enlargement of cardiac silhouette and aortic atherosclerosis.  BUN 27, creatinine 1.58 with no prior history of kidney disease.  Albumin 3.3, GFR 43   Past Medical History:  Diagnosis Date   Allergy     Anxiety    Chronic sinusitis    Diverticulosis    sigmoid colon and increased vascularization due to prior radiation, per 2013 colonoscopy   GERD (gastroesophageal reflux disease)    Hemorrhoid    Hyperlipidemia    Hypertension    Prostate cancer [facility])    history of radiation therapy   Seasonal allergies    Urinary retention 2019    Past Surgical History:  Procedure Laterality Date   COLONOSCOPY  02/2012   sigmoid diverticulosis, polyps, Dr. Clarene Essex   CYSTOSCOPY WITH URETHRAL DILATATION N/A 02/10/2019   Procedure: CYSTOSCOPY WITH URETHRAL DILATATION,;  Surgeon: Alexis Frock, MD;  Location: [facility];  Service: Urology;  Laterality: N/A;   PROSTATECTOMY     1990s    Inpatient Medications: Scheduled Meds:  Continuous Infusions:  sodium chloride     PRN Meds:   Allergies:   No Known Allergies  Social History:   Social History   Socioeconomic History   Marital status: Married    Spouse name: Not on file   Number of children: Not on file   Years of education: Not on file   Highest education level: Not on file  Occupational History   Not on file  Tobacco Use   Smoking status: Never   Smokeless tobacco: Never  Vaping Use   Vaping Use: Never used  Substance and Sexual Activity   Alcohol use: No   Drug  use: No   Sexual activity: Not on file  Other Topics Concern   Not on file  Social History Narrative   Lives alone. Widowed in 2009.   Handles own ADLs.   [employer] engineer, contracting, always busy, yard work, garden work, works at [employer].    Goes to [place].   6 brothers and 3 sisters.  Originally from [place], brought up in [place].  Father is [place], mother from [place]. Has family all over the place, [place], [place], [place], [place]   Social Determinants of Health   Financial Resource Strain: Low Risk  (10/10/2021)   Overall Financial Resource Strain (CARDIA)    Difficulty of Paying Living Expenses: Not hard at all  Food Insecurity: No Food Insecurity  (10/10/2021)   Hunger Vital Sign    Worried About Running Out of Food in the Last Year: Never true    Ran Out of Food in the Last Year: Never true  Transportation Needs: No Transportation Needs (10/10/2021)   PRAPARE - Hydrologist (Medical): No    Lack of Transportation (Non-Medical): No  Physical Activity: Inactive (10/10/2021)   Exercise Vital Sign    Days of Exercise per Week: 0 days    Minutes of Exercise per Session: 0 min  Stress: No Stress Concern Present (10/10/2021)   [place]    Feeling of Stress : Not at all  Social Connections: Not on file  Intimate Partner Violence: Not on file    Family History:   Family History  Problem Relation Age of Onset   Cancer Brother        stomach   Arthritis Brother    Arthritis Brother    Hypertension Brother    Hypertension Brother    Hypertension Mother    Cancer Father        lung, smoked 2 ppd   Cancer Paternal Uncle        cancer   Cancer Paternal Uncle        stomach     ROS:  Please see the history of present illness.  All other ROS reviewed and negative.     Physical Exam/Data:   Vitals:   07/22/22 1302 07/22/22 1315 07/22/22 1330 07/22/22 1400  BP:  97/74 133/69 137/61  Pulse: (!) 59 60 (!) 59 70  Resp: 14 16 15 14   Temp: 97.8 F (36.6 C)     TempSrc: Oral     SpO2: 99% 99% 100% 100%   No intake or output data in the 24 hours ending 07/22/22 1604    12/12/2021   10:34 AM 10/10/2021    9:11 AM 08/19/2021    1:43 PM  Last 3 Weights  Weight (lbs) 174 lb 180 lb 169 lb 6.4 oz  Weight (kg) 78.926 kg 81.647 kg 76.839 kg     There is no height or weight on file to calculate BMI.  General:  Well nourished, well developed, in no acute distress HEENT: Injected sclera Neck: no JVD Vascular: No carotid bruits; Distal pulses 2+ bilaterally Cardiac: Irregularly irregular Lungs:  clear to auscultation bilaterally, no wheezing,  rhonchi or rales  Abd: soft, nontender, no hepatomegaly  Ext: no edema Musculoskeletal:  No deformities, BUE and BLE strength normal and equal Skin: warm and dry  Neuro:  CNs 2-12 intact, no focal abnormalities noted Psych:  Normal affect   EKG:  The EKG was personally reviewed and demonstrates: Atrial  fibrillation heart rate 58 no acute STT changes Telemetry:  Telemetry was personally reviewed and demonstrates: Atrial fibrillation heart rate 60  Relevant CV Studies: No prior studies  Laboratory Data:  High Sensitivity Troponin:  No results for input(s): "TROPONINIHS" in the last 720 hours.   Chemistry Recent Labs  Lab 07/22/22 1300  NA 140  K 4.5  CL 104  CO2 23  GLUCOSE 173*  BUN 27*  CREATININE 1.58*  CALCIUM 8.9  GFRNONAA 43*  ANIONGAP 13    Recent Labs  Lab 07/22/22 1300  PROT 6.6  ALBUMIN 3.3*  AST 18  ALT 13  ALKPHOS 49  BILITOT 0.6   Lipids No results for input(s): "CHOL", "TRIG", "HDL", "LABVLDL", "LDLCALC", "CHOLHDL" in the last 168 hours.  Hematology Recent Labs  Lab 07/22/22 1300  WBC 11.7*  RBC 4.54  HGB 13.2  HCT 42.5  MCV 93.6  MCH 29.1  MCHC 31.1  RDW 13.0  PLT 199   Thyroid No results for input(s): "TSH", "FREET4" in the last 168 hours.  BNPNo results for input(s): "BNP", "PROBNP" in the last 168 hours.  DDimer No results for input(s): "DDIMER" in the last 168 hours.   Radiology/Studies:  DG Chest Port 1 View  Result Date: 07/22/2022 CLINICAL DATA:  Paleness, diaphoresis, lethargy, bradycardia, vagal during bowel movement EXAM: PORTABLE CHEST 1 VIEW COMPARISON:  Portable exam 1306 hours compared to 09/21/2016 FINDINGS: Enlargement of cardiac silhouette. Mediastinal contours and pulmonary vascularity normal. Atherosclerotic calcification aorta. Lungs clear. No pulmonary infiltrate, pleural effusion, or pneumothorax. Osseous structures unremarkable. IMPRESSION: Enlargement of cardiac silhouette without acute abnormalities. Aortic  Atherosclerosis (ICD10-I70.0). Electronically Signed   By: Lavonia Dana M.D.   On: 07/22/2022 13:12     Assessment and Plan:   Near syncopal event Patient with single episode of feeling lightheaded/weak after vagal maneuver on toilet.  No accompanying symptoms and no seizure-like activity.  Patient with HPI consistent with dehydration and lab work indicating same as well.  Suspect that the cause is multifactorial and related to dehydration, vagal maneuver, low heart rate, and age.  Consider Zio patch at discharge for detection of any other arrhythmias.  New onset atrial fibrillation Patient with new onset of atrial fibrillation with no prior complaints of lightheadedness, syncope, heart palpitations.  Uncertain of how long he has been in atrial fibrillation. Patient with slow HR per EMS report, likely need to reduce AVN blocking agent (on verapamil prior to admission) No urgent cardioversion needed but could pursue TEE/DCCV if becomes symptomatic or EF decreased, versus outpatient DCCV if remains in afib. Patient agreeable to plan with risks and benefits discussed with him and family members Patient with CHA2DS2-VASc 2 score of 3 indicating need for anticoagulation.  Consider Eliquis. Continue to monitor tele for pauses or other arrhythmias.  Obtain echocardiogram TFTs pending  Hypertension Would follow with holding home verapamil Hold off ACEI with AKI  Hyperlipidemia Atorvastatin 10 mg daily  AKI Hold lisinopril Primary team giving IV fluids  Risk Assessment/Risk Scores:     CHA2DS2-VASc Score = 3  This indicates a 3.2% annual risk of stroke. The patient's score is based upon: CHF History: 0 HTN History: 1 Diabetes History: 0 Stroke History: 0 Vascular Disease History: 0 Age Score: 2 Gender Score: 0    For questions or updates, please contact [place] Please consult [email or web address] for contact info under    Signed, Bonnee Quin, PA-C  07/22/2022 4:04 PM

## 2022-07-22 NOTE — ED Provider Notes (Signed)
[facility] Provider Note   CSN: [id number] Arrival date & time: 07/22/22  1251     History  Chief Complaint  Patient presents with   Near Syncope    Matthew Mckay is a 82 y.o. male.  HPI Presents via EMS after episode of near syncope.  Patient is talkative, denies any pain during the episode.  He recalls attempting to defecate, straining, feeling lightheaded, able to ambulate minimally, but after sitting down feeling somewhat better. He notes that he has previously been advised of possible syncope, as his son is a Marine scientist.  He was well prior to today's event.  He denies history of cardiac disease, does take antihypertensive and statin however. EMS reports the patient was cool, pale, and bradycardic on arrival.  On monitor the patient was found have a heart rate in the 30s, and first rhythm strip had heart rate in the 40s with evidence for A-fib with ventricular escape rhythm. Patient improved with fluid resuscitation and time.    Home Medications Prior to Admission medications   Medication Sig Start Date End Date Taking? Authorizing Provider  atorvastatin (LIPITOR) 10 MG tablet Take 1 tablet (10 mg total) by mouth daily. 12/12/21   Tysinger, Camelia Eng, PA-C  famotidine (PEPCID) 40 MG tablet TAKE 1 TABLET BY MOUTH EVERY DAY 09/09/21   Tysinger, Camelia Eng, PA-C  fluticasone [facility]) 50 MCG/ACT nasal spray SPRAY 2 SPRAYS INTO EACH NOSTRIL EVERY DAY 12/12/21   Tysinger, Camelia Eng, PA-C  levocetirizine (XYZAL) 5 MG tablet TAKE 1 TABLET BY MOUTH EVERY DAY IN THE EVENING 12/12/21   Tysinger, Camelia Eng, PA-C  lisinopril (ZESTRIL) 20 MG tablet Take 1 tablet (20 mg total) by mouth daily. 12/12/21   Tysinger, Camelia Eng, PA-C  olopatadine (PATANOL) 0.1 % ophthalmic solution Place 1 drop into both eyes 2 (two) times daily. 12/12/21   Tysinger, Camelia Eng, PA-C  omeprazole (PRILOSEC) 40 MG capsule TAKE 1 CAPSULE BY MOUTH EVERY DAY 12/12/21   Tysinger, Camelia Eng, PA-C   polyethylene glycol powder [facility]) powder 1 capfull daily in beverage 05/26/18   Tysinger, Camelia Eng, PA-C  verapamil (CALAN-SR) 240 MG CR tablet TAKE 1 TABLET BY MOUTH AT BEDTIME. 09/09/21   Tysinger, Camelia Eng, PA-C      Allergies    Patient has no known allergies.    Review of Systems   Review of Systems  All other systems reviewed and are negative.   Physical Exam Updated Vital Signs BP 137/61   Pulse 70   Temp 97.8 F (36.6 C) (Oral)   Resp 14   SpO2 100%  Physical Exam Vitals and nursing note reviewed.  Constitutional:      General: He is not in acute distress.    Appearance: He is well-developed.  HENT:     Head: Normocephalic and atraumatic.  Eyes:     Conjunctiva/sclera: Conjunctivae normal.  Cardiovascular:     Rate and Rhythm: Normal rate and regular rhythm.  Pulmonary:     Effort: Pulmonary effort is normal. No respiratory distress.     Breath sounds: No stridor.  Abdominal:     General: There is no distension.  Skin:    General: Skin is warm and dry.  Neurological:     Mental Status: He is alert and oriented to person, place, and time.     ED Results / Procedures / Treatments   Labs (all labs ordered are listed, but only abnormal results are displayed) Labs  Reviewed  COMPREHENSIVE METABOLIC PANEL - Abnormal; Notable for the following components:      Result Value   Glucose, Bld 173 (*)    BUN 27 (*)    Creatinine, Ser 1.58 (*)    Albumin 3.3 (*)    GFR, Estimated 43 (*)    All other components within normal limits  CBC WITH DIFFERENTIAL/PLATELET - Abnormal; Notable for the following components:   WBC 11.7 (*)    Neutro Abs 8.2 (*)    All other components within normal limits  CBG MONITORING, ED    EKG EKG Interpretation  Date/Time:  Wednesday July 22 2022 13:01:48 EDT Ventricular Rate:  58 PR Interval:    QRS Duration: 113 QT Interval:  542 QTC Calculation: 533 R Axis:   28 Text Interpretation: Atrial fibrillation Artifact  Abnormal ECG Confirmed by Carmin Muskrat [phone]) on 07/22/2022 1:28:04 PM  Radiology DG Chest Port 1 View  Result Date: 07/22/2022 CLINICAL DATA:  Paleness, diaphoresis, lethargy, bradycardia, vagal during bowel movement EXAM: PORTABLE CHEST 1 VIEW COMPARISON:  Portable exam 1306 hours compared to 09/21/2016 FINDINGS: Enlargement of cardiac silhouette. Mediastinal contours and pulmonary vascularity normal. Atherosclerotic calcification aorta. Lungs clear. No pulmonary infiltrate, pleural effusion, or pneumothorax. Osseous structures unremarkable. IMPRESSION: Enlargement of cardiac silhouette without acute abnormalities. Aortic Atherosclerosis (ICD10-I70.0). Electronically Signed   By: Lavonia Dana M.D.   On: 07/22/2022 13:12    Procedures Procedures    Medications Ordered in ED Medications  0.9 %  sodium chloride infusion (has no administration in time range)    ED Course/ Medical Decision Making/ A&P                             Medical Decision Making Elderly male with multiple medical problems including hypertension, hyperlipidemia presents after episode of near syncope versus syncope with evidence for bradycardia on monitor. History is obtained by the patient, his daughter who arrives later, and EMS individuals. Patient is currently asymptomatic, but given the episodes, and evidence for new arrhythmia on monitor cardiology has been consulted, patient likely to require admission, labs sent, x-ray sent, fluids started.  Cardiac 60 afib, abn  Pulse ox 99% room air normal   Amount and/or Complexity of Data Reviewed Independent Historian: caregiver and EMS Labs: ordered. Decision-making details documented in ED Course. Radiology: ordered and independent interpretation performed. Decision-making details documented in ED Course. ECG/medicine tests: ordered and independent interpretation performed. Decision-making details documented in ED Course.    Details: EMS rhythm strip A-fib,  ventricular escape rhythm 41, 44 on 2 different studies  Risk Prescription drug management. Decision regarding hospitalization.   2:41 PM  Patient in similar condition.  No new complaints.  He has had another bowel movement without syncope.  Labs generally unremarkable aside from some evidence of dehydration with elevated BUN and creatinine and mild leukocytosis.  X-ray unremarkable  [place] this adult male presents after episode of near syncope, with rhythm strip and ECG evidence for new arrhythmia, A-fib with ventricular escape beats.  Patient is on calcium channel blocker, not beta-blocker, does have evidence for dehydration, but no evidence for ACS without chest pain, without syncope, without evidence for infection.  After discussion with her cardiology colleagues, patient was started on continuous cardiac monitoring, will be admitted for further monitoring, management.        Final Clinical Impression(s) / ED Diagnoses Final diagnoses:  New onset a-fib     Carmin Muskrat, MD 07/22/22  1443  

## 2022-07-22 NOTE — ED Triage Notes (Signed)
Pt BIB [facility] from work after vagaling during BM causing paleness, diaphoresis, and lethargy. Initial HR in the 30s and irregular. Nauseous earlier today as well but no SOB, chest pain. HR currently 57, remains irregular and no hx of afib. EMS administered 570mL NS en route, BP 122/54. A&OX4, no pain.

## 2022-07-23 ENCOUNTER — Observation Stay (HOSPITAL_COMMUNITY): Payer: Medicare Other

## 2022-07-23 ENCOUNTER — Other Ambulatory Visit (HOSPITAL_COMMUNITY): Payer: Self-pay

## 2022-07-23 DIAGNOSIS — R55 Syncope and collapse: Secondary | ICD-10-CM

## 2022-07-23 DIAGNOSIS — Z923 Personal history of irradiation: Secondary | ICD-10-CM | POA: Diagnosis not present

## 2022-07-23 DIAGNOSIS — I700 Atherosclerosis of aorta: Secondary | ICD-10-CM | POA: Diagnosis present

## 2022-07-23 DIAGNOSIS — I4891 Unspecified atrial fibrillation: Secondary | ICD-10-CM

## 2022-07-23 DIAGNOSIS — K219 Gastro-esophageal reflux disease without esophagitis: Secondary | ICD-10-CM | POA: Diagnosis present

## 2022-07-23 DIAGNOSIS — N179 Acute kidney failure, unspecified: Secondary | ICD-10-CM

## 2022-07-23 DIAGNOSIS — I493 Ventricular premature depolarization: Secondary | ICD-10-CM | POA: Diagnosis present

## 2022-07-23 DIAGNOSIS — E785 Hyperlipidemia, unspecified: Secondary | ICD-10-CM

## 2022-07-23 DIAGNOSIS — J811 Chronic pulmonary edema: Secondary | ICD-10-CM | POA: Diagnosis not present

## 2022-07-23 DIAGNOSIS — Z79899 Other long term (current) drug therapy: Secondary | ICD-10-CM | POA: Diagnosis not present

## 2022-07-23 DIAGNOSIS — I4819 Other persistent atrial fibrillation: Secondary | ICD-10-CM | POA: Diagnosis present

## 2022-07-23 DIAGNOSIS — E860 Dehydration: Secondary | ICD-10-CM | POA: Diagnosis present

## 2022-07-23 DIAGNOSIS — I10 Essential (primary) hypertension: Secondary | ICD-10-CM

## 2022-07-23 DIAGNOSIS — Z9109 Other allergy status, other than to drugs and biological substances: Secondary | ICD-10-CM | POA: Diagnosis not present

## 2022-07-23 DIAGNOSIS — N400 Enlarged prostate without lower urinary tract symptoms: Secondary | ICD-10-CM | POA: Diagnosis present

## 2022-07-23 DIAGNOSIS — Z7951 Long term (current) use of inhaled steroids: Secondary | ICD-10-CM | POA: Diagnosis not present

## 2022-07-23 DIAGNOSIS — K5909 Other constipation: Secondary | ICD-10-CM | POA: Diagnosis present

## 2022-07-23 DIAGNOSIS — Z8261 Family history of arthritis: Secondary | ICD-10-CM | POA: Diagnosis not present

## 2022-07-23 DIAGNOSIS — I119 Hypertensive heart disease without heart failure: Secondary | ICD-10-CM | POA: Diagnosis present

## 2022-07-23 DIAGNOSIS — Z7901 Long term (current) use of anticoagulants: Secondary | ICD-10-CM | POA: Diagnosis not present

## 2022-07-23 DIAGNOSIS — Z8546 Personal history of malignant neoplasm of prostate: Secondary | ICD-10-CM | POA: Diagnosis not present

## 2022-07-23 DIAGNOSIS — Z8249 Family history of ischemic heart disease and other diseases of the circulatory system: Secondary | ICD-10-CM | POA: Diagnosis not present

## 2022-07-23 LAB — ECHOCARDIOGRAM COMPLETE
AR max vel: 2.54 cm2
AV Area VTI: 2.27 cm2
AV Area mean vel: 2.35 cm2
AV Mean grad: 5 mmHg
AV Peak grad: 9.3 mmHg
Ao pk vel: 1.53 m/s
Height: 69 in
S' Lateral: 2.6 cm
Weight: 2848 oz

## 2022-07-23 LAB — URINALYSIS, ROUTINE W REFLEX MICROSCOPIC
Bilirubin Urine: NEGATIVE
Glucose, UA: NEGATIVE mg/dL
Hgb urine dipstick: NEGATIVE
Ketones, ur: NEGATIVE mg/dL
Leukocytes,Ua: NEGATIVE
Nitrite: NEGATIVE
Protein, ur: 30 mg/dL — AB
Specific Gravity, Urine: 1.016 (ref 1.005–1.030)
pH: 5 (ref 5.0–8.0)

## 2022-07-23 LAB — BASIC METABOLIC PANEL
Anion gap: 8 (ref 5–15)
BUN: 21 mg/dL (ref 8–23)
CO2: 25 mmol/L (ref 22–32)
Calcium: 8.7 mg/dL — ABNORMAL LOW (ref 8.9–10.3)
Chloride: 105 mmol/L (ref 98–111)
Creatinine, Ser: 1.03 mg/dL (ref 0.61–1.24)
GFR, Estimated: >60 mL/min (ref >60)
Glucose, Bld: 101 mg/dL — ABNORMAL HIGH (ref 70–99)
Potassium: 3.8 mmol/L (ref 3.5–5.1)
Sodium: 138 mmol/L (ref 135–145)

## 2022-07-23 LAB — T4, FREE: Free T4: 0.96 ng/dL (ref 0.61–1.12)

## 2022-07-23 LAB — TSH: TSH: 0.883 u[IU]/mL (ref 0.350–4.500)

## 2022-07-23 MED ORDER — APIXABAN 5 MG PO TABS: 5.0000 mg | ORAL_TABLET | Freq: Two times a day (BID) | ORAL | 0 refills | Status: DC

## 2022-07-23 MED ORDER — APIXABAN 5 MG PO TABS
5.0000 mg | ORAL_TABLET | Freq: Two times a day (BID) | ORAL | Status: DC
  Administered 2022-07-23: 5 mg via ORAL
  Filled 2022-07-23: qty 1

## 2022-07-23 MED ORDER — METOPROLOL SUCCINATE ER 50 MG PO TB24: 50.0000 mg | ORAL_TABLET | Freq: Every day | ORAL | 0 refills | Status: DC

## 2022-07-23 MED ORDER — METOPROLOL SUCCINATE ER 50 MG PO TB24
50.0000 mg | ORAL_TABLET | Freq: Every day | ORAL | Status: DC
  Administered 2022-07-23: 50 mg via ORAL

## 2022-07-23 NOTE — Progress Notes (Signed)
Echocardiogram 2D Echocardiogram has been performed.  Matthew Mckay 07/23/2022, 12:46 PM

## 2022-07-23 NOTE — Progress Notes (Signed)
Rounding Note    Patient Name: Matthew Mckay Date of Encounter: 07/23/2022  [place] Cardiologist: None   Subjective   He does not c/o any issues. Talked with his son and daughter this AM. Son and daughter in law are nurse/MD. Agree with the plan for [facility] , BB and outpatient FU as long as TTE is unremarkable  Inpatient Medications    Scheduled Meds:  apixaban  5 mg Oral BID   atorvastatin  10 mg Oral Daily   famotidine  40 mg Oral Daily   fluticasone  2 spray Each Nare Daily   loratadine  10 mg Oral Daily   olopatadine  1 drop Both Eyes BID   pantoprazole  40 mg Oral Daily   sodium chloride flush  3 mL Intravenous Q12H   Continuous Infusions:  sodium chloride 125 mL/hr at 07/23/22 0528   PRN Meds: metoprolol tartrate   Vital Signs    Vitals:   07/23/22 0800 07/23/22 0815 07/23/22 0830 07/23/22 0836  BP: (!) 166/115     Pulse: (!) 106 (!) 124 (!) 101   Resp: (!) 25 17 (!) 23   Temp:    98.4 F (36.9 C)  TempSrc:      SpO2: 97% 98% 97%   Weight:      Height:       No intake or output data in the 24 hours ending 07/23/22 1019    07/23/2022    5:29 AM 12/12/2021   10:34 AM 10/10/2021    9:11 AM  Last 3 Weights  Weight (lbs) 178 lb 174 lb 180 lb  Weight (kg) 80.74 kg 78.926 kg 81.647 kg      Telemetry    Atrial fibrillation rates <120 - Personally Reviewed  ECG    No new - Personally Reviewed  Physical Exam   Vitals:   07/23/22 0830 07/23/22 0836  BP:    Pulse: (!) 101   Resp: (!) 23   Temp:  98.4 F (36.9 C)  SpO2: 97%     GEN: No acute distress.   Neck: No JVD Cardiac: IRRR, no murmurs, rubs, or gallops.  Respiratory: nl wob, no rales BL GI: Soft, nontender, non-distended  MS: No edema; No deformity. Neuro:  Nonfocal  Psych: Normal affect   Labs    High Sensitivity Troponin:  No results for input(s): "TROPONINIHS" in the last 720 hours.   Chemistry Recent Labs  Lab 07/22/22 1300 07/23/22 0515  NA 140 138  K 4.5 3.8   CL 104 105  CO2 23 25  GLUCOSE 173* 101*  BUN 27* 21  CREATININE 1.58* 1.03  CALCIUM 8.9 8.7*  PROT 6.6  --   ALBUMIN 3.3*  --   AST 18  --   ALT 13  --   ALKPHOS 49  --   BILITOT 0.6  --   GFRNONAA 43* >60  ANIONGAP 13 8    Lipids No results for input(s): "CHOL", "TRIG", "HDL", "LABVLDL", "LDLCALC", "CHOLHDL" in the last 168 hours.  Hematology Recent Labs  Lab 07/22/22 1300  WBC 11.7*  RBC 4.54  HGB 13.2  HCT 42.5  MCV 93.6  MCH 29.1  MCHC 31.1  RDW 13.0  PLT 199   Thyroid No results for input(s): "TSH", "FREET4" in the last 168 hours.  BNPNo results for input(s): "BNP", "PROBNP" in the last 168 hours.  DDimer No results for input(s): "DDIMER" in the last 168 hours.   Radiology  DG Chest 1 View  Result Date: 07/23/2022 CLINICAL DATA:  82 year old male with history of congestive heart failure. EXAM: CHEST  1 VIEW COMPARISON:  Chest x-ray 07/22/2022. FINDINGS: Lung volumes are low. No consolidative airspace disease. No definite pleural effusions. No pneumothorax. Mild pulmonary venous congestion, without frank pulmonary edema. Mild cardiomegaly. Upper mediastinal contours are within normal limits. Atherosclerotic calcifications are noted in the thoracic aorta. IMPRESSION: 1. Cardiomegaly with mild pulmonary venous congestion, but no frank pulmonary edema. 2. Aortic atherosclerosis. Electronically Signed   By: Vinnie Langton M.D.   On: 07/23/2022 07:53   US RENAL  Result Date: 07/22/2022 CLINICAL DATA:  Acute renal insufficiency EXAM: RENAL / URINARY TRACT ULTRASOUND COMPLETE COMPARISON:  05/02/2021 FINDINGS: Right Kidney: Renal measurements: 11.0 x 6.2 x 5.3 cm = volume: 188.6 mL. Mild increased renal cortical echotexture. No hydronephrosis. There are numerous simple right renal cortical cysts, largest measuring 6.0 cm within the mid right kidney. Left Kidney: Renal measurements: 10.9 x 6.4 by 5.7 cm = volume: 207.6 mL. Mild increased renal cortical echotexture. No  hydronephrosis. Numerous simple benign renal cortical cysts, largest in the mid left kidney measuring 4 cm. Bladder: Appears normal for degree of bladder distention. Other: None. IMPRESSION: 1. Mild bilateral increased renal cortical echotexture consistent with medical renal disease. 2. Simple bilateral renal cortical cysts, benign. No specific imaging follow-up is recommended. Electronically Signed   By: Randa Ngo M.D.   On: 07/22/2022 19:37   DG Chest Port 1 View  Result Date: 07/22/2022 CLINICAL DATA:  Paleness, diaphoresis, lethargy, bradycardia, vagal during bowel movement EXAM: PORTABLE CHEST 1 VIEW COMPARISON:  Portable exam 1306 hours compared to 09/21/2016 FINDINGS: Enlargement of cardiac silhouette. Mediastinal contours and pulmonary vascularity normal. Atherosclerotic calcification aorta. Lungs clear. No pulmonary infiltrate, pleural effusion, or pneumothorax. Osseous structures unremarkable. IMPRESSION: Enlargement of cardiac silhouette without acute abnormalities. Aortic Atherosclerosis (ICD10-I70.0). Electronically Signed   By: Lavonia Dana M.D.   On: 07/22/2022 13:12    Cardiac Studies   Pending TTE  Patient Profile     Matthew Mckay is a 82 y.o. male with a hx of HTN, HLD, GERD, prostate cancer who is being seen 07/22/2022 for the evaluation of near-syncope and new onset atrial fib with bradycardia at the request of Dr. Vanita Panda.   Assessment & Plan    Vasovagal syncope: rates improved, in afib rates controlled. Occurred with straining/defecation. No indication for PPM. Recommended volume resuscitation. Can stop now.  New Onset Paroxysmal Atrial Fibrillation : rates controlled. Will start metoprolol 50 mg XL today. Pending a TTE per afib protocol. Continue eliquis 5 mg BID.  HTN: can restart home BP meds  HLD : continue lipitor 10 mg daily  Recommend eliquis for at least 3 weeks. We can see him as an outpatient. Will work on [employer]. Otherwise, he is asymptomatic, as long as his  echo does not show WMA, he can be discharged home.   Time Spent Directly with Patient:  I have spent a total of 35 minutes with the patient reviewing hospital notes, telemetry, EKGs, labs and examining the patient as well as establishing an assessment and plan that was discussed personally with the patient.  > 50% of time was spent in direct patient care.   For questions or updates, please contact [place] Please consult [email or web address] for contact info under        Signed, Janina Mayo, MD  07/23/2022, 10:19 AM

## 2022-07-23 NOTE — Discharge Summary (Signed)
Physician Discharge Summary   Patient: Matthew Mckay MRN: [id number] DOB: [birth year]  Admit date:     07/22/2022  Discharge date: 07/23/22  Discharge Physician: Jimmy Picket Maytal Mijangos   PCP: Carlena Hurl, PA-C   Recommendations at discharge:    Patient has been placed on apixaban for anticoagulation. Metoprolol for heart rate control, discontinue verapamil.  Outpatient follow up with Cardiology. Outpatient follow up with Chana Bode PA-C  Discharge Diagnoses: Principal Problem:   New onset a-fib Active Problems:   A-fib   Syncope, vasovagal   Sinus bradycardia   CHF (congestive heart failure)   Atrial fibrillation  Resolved Problems:   * No resolved hospital problems. [facility] Course: Matthew Mckay was admitted to the hospital with the working diagnosis of syncope.   82 yo male with the past medical history of hypertension, BPH and dyslipidemia who presented after a syncope episode. Patient sustained a syncope episode while moving his bowels in the bathroom. Loss of consciousness preceded by light headedness. After recovering his consciousness he continue to be weak and lightheaded, EMS was called and he was found bradycardic in the 30's. In the ED his blood pressure was 90, heart rate 50 to 60, 02 saturation 100% and RR 15, dry mucous membranes, heart with S1 and S2 present, irregularly irregular, respiratory with no rales or wheezing, abdomen with no distention and no lower extremity edema.   Na 140, K 4,5 Cl 104 bicarbonate 23, glucose 173, bun 27 cr 1,58 Wbc 11,7 hgb 13.2 plt 199  Urine analysis SG 1,016, protein 30, negative leukocytes.   Chest radiograph with cardiomegaly with no infiltrates or effusions.   EKG 58 bpm, normal axis, right bindle branch block, qtc 533, atrial fibrillation rhythm with no significant ST segment or T wave changes.   Echocardiogram with preserved LV systolic function with no wall motion abnormalities.   Patient was placed on  metoprolol and apixaban.     Assessment and Plan: * New onset a-fib Patient with improved rate control, telemetry personally reviewed with persistent atrial fibrillation with rate 80 to 90.  Echocardiogram with preserved LV systolic function with EF 60 to 65%, mild LVH, RV with normal systolic function, RVSP [id number] mmHg. No significant valvular disease and no wall motion abnormalities.   Plan to continue rate control with metoprolol. Cha2Ds2Vasc score is 3, patient started on apixaban for anticoagulation.  TSH was normal last year, follow up TSH as outpatient.  Follow up with cardiology as outpatient.  Heart failure ruled out.    Syncope, vasovagal Positive vasovagal syncope at home with reflex bradycardia.  Currently patient in atrial fibrillation.  Echocardiogram with no structural heart disease.   AKI (acute kidney injury) Patient was placed on isotonic IV fluids, follow up renal function with serum cr at 1.0 with K at 3,8 and serum bicarbonate at 25. Plan to resume lisinopril at the time of discharge and follow up renal function as outpatient.   Essential hypertension Continue blood pressure control with lisinopril 20 mg daily. Discontinue verapamil and started on metoprolol for blood pressure control.   Hyperlipidemia Continue statin therapy.  GERD (gastroesophageal reflux disease) Continue antiacid therapy.         Consultants: cardiology  Procedures performed: none   Disposition: Home Diet recommendation:  Cardiac diet DISCHARGE MEDICATION: Allergies as of 07/23/2022       Reactions   Pollen Extract-tree Extract [pollen Extract]         Medication List     STOP  taking these medications    olopatadine 0.1 % ophthalmic solution Commonly known as: Patanol   verapamil 240 MG CR tablet Commonly known as: CALAN-SR       TAKE these medications    apixaban 5 MG Tabs tablet Commonly known as: ELIQUIS Take 1 tablet (5 mg total) by mouth 2 (two) times  daily.   atorvastatin 10 MG tablet Commonly known as: LIPITOR Take 1 tablet (10 mg total) by mouth daily.   famotidine 40 MG tablet Commonly known as: PEPCID TAKE 1 TABLET BY MOUTH EVERY DAY   fluticasone 50 MCG/ACT nasal spray Commonly known as: FLONASE SPRAY 2 SPRAYS INTO EACH NOSTRIL EVERY DAY What changed:  how much to take how to take this when to take this   levocetirizine 5 MG tablet Commonly known as: XYZAL TAKE 1 TABLET BY MOUTH EVERY DAY IN THE EVENING   lisinopril 20 MG tablet Commonly known as: ZESTRIL Take 1 tablet (20 mg total) by mouth daily.   metoprolol succinate 50 MG 24 hr tablet Commonly known as: TOPROL-XL Take 1 tablet (50 mg total) by mouth daily. Take with or immediately following a meal. Start taking on: July 24, 2022   omeprazole 40 MG capsule Commonly known as: PRILOSEC TAKE 1 CAPSULE BY MOUTH EVERY DAY What changed:  how much to take how to take this when to take this   polyethylene glycol powder 17 GM/SCOOP powder Commonly known as: GLYCOLAX/MIRALAX 1 capfull daily in beverage   Vitamin D3 75 MCG (3000 UT) Tabs Generic drug: Cholecalciferol Take 75 mcg by mouth daily.        Discharge Exam: Filed Weights   07/23/22 0529  Weight: 80.7 kg   BP (!) 149/84   Pulse 84   Temp 99.7 F (37.6 C) (Oral)   Resp 15   Ht 5\' 9"  (1.753 m)   Wt 80.7 kg   SpO2 98%   BMI 26.29 kg/m   Patient is feeling better, no chest pain, no palpitations.   Neurology awake and alert ENT with no pallor, positive red conjunctivae on the left. Cardiovascular with S1 and S2 present, irregularly irregular with no gallops, rubs or murmurs Respiratory with no rales, rhonchi or wheezing Abdomen with no distention  No lower extremity edema   Condition at discharge: stable  The results of significant diagnostics from this hospitalization (including imaging, microbiology, ancillary and laboratory) are listed below for reference.   Imaging  Studies: ECHOCARDIOGRAM COMPLETE  Result Date: 07/23/2022    ECHOCARDIOGRAM REPORT   Patient Name:   Matthew Mckay Date of Exam: 07/23/2022 Medical Rec #:  [id number]       Height:       69.0 in Accession #:    [id number]      Weight:       178.0 lb Date of Birth:  03-11-1941       BSA:          1.966 m Patient Age:    63 years        BP:           158/96 mmHg Patient Gender: M               HR:           91 bpm. Exam Location:  Inpatient Procedure: 2D Echo, Cardiac Doppler and Color Doppler Indications:    Syncope R55  History:        Patient has no prior history of Echocardiogram examinations.  CHF, Arrythmias:Atrial Fibrillation and Bradycardia,                 Signs/Symptoms:Syncope; Risk Factors:Hypertension and                 Dyslipidemia.  Sonographer:    Ronny Flurry Referring Phys: [id number] [place]  1. Left ventricular ejection fraction, by estimation, is 60 to 65%. The left ventricle has normal function. The left ventricle has no regional wall motion abnormalities. There is mild concentric left ventricular hypertrophy. Left ventricular diastolic parameters are indeterminate.  2. Right ventricular systolic function is normal. The right ventricular size is normal. There is moderately elevated pulmonary artery systolic pressure.  3. The mitral valve is normal in structure. Mild mitral valve regurgitation. No evidence of mitral stenosis.  4. The aortic valve is tricuspid. There is mild calcification of the aortic valve. Aortic valve regurgitation is mild. Aortic valve sclerosis/calcification is present, without any evidence of aortic stenosis. Aortic valve area, by VTI measures 2.27 cm.  Aortic valve mean gradient measures 5.0 mmHg. Aortic valve Vmax measures 1.52 m/s.  5. The inferior vena cava is dilated in size with >50% respiratory variability, suggesting right atrial pressure of 8 mmHg. FINDINGS  Left Ventricle: Left ventricular ejection fraction, by estimation, is 60  to 65%. The left ventricle has normal function. The left ventricle has no regional wall motion abnormalities. The left ventricular internal cavity size was normal in size. There is  mild concentric left ventricular hypertrophy. Left ventricular diastolic parameters are indeterminate. Normal left ventricular filling pressure. Right Ventricle: The right ventricular size is normal. No increase in right ventricular wall thickness. Right ventricular systolic function is normal. There is moderately elevated pulmonary artery systolic pressure. The tricuspid regurgitant velocity is 3.38 m/s, and with an assumed right atrial pressure of 8 mmHg, the estimated right ventricular systolic pressure is [id number] mmHg. Left Atrium: Left atrial size was normal in size. Right Atrium: Right atrial size was normal in size. Pericardium: There is no evidence of pericardial effusion. Mitral Valve: The mitral valve is normal in structure. Mild mitral valve regurgitation. No evidence of mitral valve stenosis. Tricuspid Valve: The tricuspid valve is normal in structure. Tricuspid valve regurgitation is mild . No evidence of tricuspid stenosis. Aortic Valve: The aortic valve is tricuspid. There is mild calcification of the aortic valve. Aortic valve regurgitation is mild. Aortic valve sclerosis/calcification is present, without any evidence of aortic stenosis. Aortic valve mean gradient measures 5.0 mmHg. Aortic valve peak gradient measures 9.3 mmHg. Aortic valve area, by VTI measures 2.27 cm. Pulmonic Valve: The pulmonic valve was normal in structure. Pulmonic valve regurgitation is mild. No evidence of pulmonic stenosis. Aorta: The aortic Mckay is normal in size and structure. Venous: The inferior vena cava is dilated in size with greater than 50% respiratory variability, suggesting right atrial pressure of 8 mmHg. IAS/Shunts: No atrial level shunt detected by color flow Doppler.  LEFT VENTRICLE PLAX 2D LVIDd:         3.90 cm   Diastology LVIDs:          2.60 cm   LV e' medial:    11.20 cm/s LV PW:         1.20 cm   LV E/e' medial:  5.7 LV IVS:        1.10 cm   LV e' lateral:   15.90 cm/s LVOT diam:     2.20 cm   LV E/e' lateral: 4.0 LV SV:  58 LV SV Index:   29 LVOT Area:     3.80 cm  RIGHT VENTRICLE             IVC RV S prime:     20.00 cm/s  IVC diam: 2.30 cm TAPSE (M-mode): 1.5 cm LEFT ATRIUM           Index        RIGHT ATRIUM           Index LA diam:      4.40 cm 2.24 cm/m   RA Area:     20.80 cm LA Vol (A2C): 42.6 ml 21.67 ml/m  RA Volume:   57.20 ml  29.09 ml/m LA Vol (A4C): 48.5 ml 24.67 ml/m  AORTIC VALVE                     PULMONIC VALVE AV Area (Vmax):    2.54 cm      PR End Diast Vel: 7.13 msec AV Area (Vmean):   2.35 cm AV Area (VTI):     2.27 cm AV Vmax:           152.50 cm/s AV Vmean:          104.150 cm/s AV VTI:            0.254 m AV Peak Grad:      9.3 mmHg AV Mean Grad:      5.0 mmHg LVOT Vmax:         101.90 cm/s LVOT Vmean:        64.500 cm/s LVOT VTI:          0.152 m LVOT/AV VTI ratio: 0.60  AORTA Ao Mckay diam: 3.60 cm Ao Asc diam:  3.70 cm MV E velocity: 64.00 cm/s  TRICUSPID VALVE                            TR Peak grad:   45.7 mmHg                            TR Vmax:        338.00 cm/s                             SHUNTS                            Systemic VTI:  0.15 m                            Systemic Diam: 2.20 cm Skeet Latch MD Electronically signed by Skeet Latch MD Signature Date/Time: 07/23/2022/3:03:11 PM    Final    DG Chest 1 View  Result Date: 07/23/2022 CLINICAL DATA:  82 year old male with history of congestive heart failure. EXAM: CHEST  1 VIEW COMPARISON:  Chest x-ray 07/22/2022. FINDINGS: Lung volumes are low. No consolidative airspace disease. No definite pleural effusions. No pneumothorax. Mild pulmonary venous congestion, without frank pulmonary edema. Mild cardiomegaly. Upper mediastinal contours are within normal limits. Atherosclerotic calcifications are noted in the thoracic aorta.  IMPRESSION: 1. Cardiomegaly with mild pulmonary venous congestion, but no frank pulmonary edema. 2. Aortic atherosclerosis. Electronically Signed   By: Vinnie Langton M.D.   On: 07/23/2022 07:53   US RENAL  Result  Date: 07/22/2022 CLINICAL DATA:  Acute renal insufficiency EXAM: RENAL / URINARY TRACT ULTRASOUND COMPLETE COMPARISON:  05/02/2021 FINDINGS: Right Kidney: Renal measurements: 11.0 x 6.2 x 5.3 cm = volume: 188.6 mL. Mild increased renal cortical echotexture. No hydronephrosis. There are numerous simple right renal cortical cysts, largest measuring 6.0 cm within the mid right kidney. Left Kidney: Renal measurements: 10.9 x 6.4 by 5.7 cm = volume: 207.6 mL. Mild increased renal cortical echotexture. No hydronephrosis. Numerous simple benign renal cortical cysts, largest in the mid left kidney measuring 4 cm. Bladder: Appears normal for degree of bladder distention. Other: None. IMPRESSION: 1. Mild bilateral increased renal cortical echotexture consistent with medical renal disease. 2. Simple bilateral renal cortical cysts, benign. No specific imaging follow-up is recommended. Electronically Signed   By: Randa Ngo M.D.   On: 07/22/2022 19:37   DG Chest Port 1 View  Result Date: 07/22/2022 CLINICAL DATA:  Paleness, diaphoresis, lethargy, bradycardia, vagal during bowel movement EXAM: PORTABLE CHEST 1 VIEW COMPARISON:  Portable exam 1306 hours compared to 09/21/2016 FINDINGS: Enlargement of cardiac silhouette. Mediastinal contours and pulmonary vascularity normal. Atherosclerotic calcification aorta. Lungs clear. No pulmonary infiltrate, pleural effusion, or pneumothorax. Osseous structures unremarkable. IMPRESSION: Enlargement of cardiac silhouette without acute abnormalities. Aortic Atherosclerosis (ICD10-I70.0). Electronically Signed   By: Lavonia Dana M.D.   On: 07/22/2022 13:12    Microbiology: Results for orders placed or performed in visit on 05/16/20  Urine Culture     Status: Abnormal    Collection Time: 05/16/20  3:19 PM   Specimen: Urine   UR  Result Value Ref Range Status   Urine Culture, Routine Final report (A)  Final   Organism ID, Bacteria Escherichia coli (A)  Final    Comment: Cefazolin <=4 ug/mL Cefazolin with an MIC <=16 predicts susceptibility to the oral agents cefaclor, cefdinir, cefpodoxime, cefprozil, cefuroxime, cephalexin, and loracarbef when used for therapy of uncomplicated urinary tract infections due to E. coli, Klebsiella pneumoniae, and Proteus mirabilis. Greater than 100,000 colony forming units per mL    Antimicrobial Susceptibility Comment  Final    Comment:       ** S = Susceptible; I = Intermediate; R = Resistant **                    P = Positive; N = Negative             MICS are expressed in micrograms per mL    Antibiotic                 RSLT#1    RSLT#2    RSLT#3    RSLT#4 Amoxicillin/Clavulanic Acid    S Ampicillin                     S Cefepime                       S Ceftriaxone                    S Cefuroxime                     S Ciprofloxacin                  S Ertapenem                      S Gentamicin  S Imipenem                       S Levofloxacin                   S Meropenem                      S Nitrofurantoin                 S Piperacillin/Tazobactam        S Tetracycline                   S Tobramycin                     S Trimethoprim/Sulfa             S     Labs: CBC: Recent Labs  Lab 07/22/22 1300  WBC 11.7*  NEUTROABS 8.2*  HGB 13.2  HCT 42.5  MCV 93.6  PLT 123XX123   Basic Metabolic Panel: Recent Labs  Lab 07/22/22 1300 07/23/22 0515  NA 140 138  K 4.5 3.8  CL 104 105  CO2 23 25  GLUCOSE 173* 101*  BUN 27* 21  CREATININE 1.58* 1.03  CALCIUM 8.9 8.7*   Liver Function Tests: Recent Labs  Lab 07/22/22 1300  AST 18  ALT 13  ALKPHOS 49  BILITOT 0.6  PROT 6.6  ALBUMIN 3.3*   CBG: No results for input(s): "GLUCAP" in the last 168 hours.  Discharge time spent:  greater than 30 minutes.  Signed: Tawni Millers, MD [facility] 07/23/2022

## 2022-07-23 NOTE — Progress Notes (Signed)
ANTICOAGULATION CONSULT NOTE - Initial Consult  Pharmacy Consult for Apixaban Indication: atrial fibrillation  Allergies  Allergen Reactions   Pollen Extract-Tree Extract [Pollen Extract]     Patient Measurements: Height: 5\' 9"  (175.3 cm) Weight: 80.7 kg (178 lb) IBW/kg (Calculated) : 70.7  Vital Signs: Temp: 98.4 F (36.9 C) (04/04 0836) Temp Source: Oral (04/04 0434) BP: 166/115 (04/04 0800) Pulse Rate: 101 (04/04 0830)  Labs: Recent Labs    07/22/22 1300 07/23/22 0515  HGB 13.2  --   HCT 42.5  --   PLT 199  --   CREATININE 1.58* 1.03     Estimated Creatinine Clearance: 54.3 mL/min (by C-G formula based on SCr of 1.03 mg/dL).   Medical History: Past Medical History:  Diagnosis Date   Allergy    Anxiety    Chronic sinusitis    Diverticulosis    sigmoid colon and increased vascularization due to prior radiation, per 2013 colonoscopy   GERD (gastroesophageal reflux disease)    Hemorrhoid    Hyperlipidemia    Hypertension    Prostate cancer    history of radiation therapy   Seasonal allergies    Urinary retention 2019    Medications:  (Not in a hospital admission) Scheduled:   apixaban  5 mg Oral BID   atorvastatin  10 mg Oral Daily   famotidine  40 mg Oral Daily   fluticasone  2 spray Each Nare Daily   loratadine  10 mg Oral Daily   olopatadine  1 drop Both Eyes BID   pantoprazole  40 mg Oral Daily   sodium chloride flush  3 mL Intravenous Q12H   Infusions:   sodium chloride 125 mL/hr at 07/23/22 [id number]    Assessment: 61 yom with a history of HTN, HLD, GERD presenting with feeling lightheaded/weak after vagal maneuver on toilet. Apixaban per pharmacy consult for AF.  SCr decreased from 1.58 to 1.03 (baseline SCr~0.7-0.9)  Hgb 13.2; plt 199  Goal of Therapy:  Monitor platelets by anticoagulation protocol: Yes   Plan:  Increase apixaban dose to 5mg  po BID Monitor for s/sx of bleeding Apixaban copay cost is $47 per month (not eligible for  copay assistance card since pt has Medicare Part D insurance) Pharmacy to educate prior to discharge  Luisa Hart, PharmD, BCPS Clinical Pharmacist 07/23/2022 8:40 AM   Please refer to [facility] for pharmacy phone number

## 2022-07-23 NOTE — Assessment & Plan Note (Signed)
Positive vasovagal syncope at home with reflex bradycardia.  Currently patient in atrial fibrillation.  Echocardiogram with no structural heart disease.

## 2022-07-23 NOTE — TOC Benefit Eligibility Note (Signed)
Patient Teacher, English as a foreign language completed.    The patient is currently admitted and upon discharge could be taking Eliquis 5 mg.  The current 30 day co-pay is $47.00.   The patient is insured through [employer] Part D   This test claim was processed through [facility] amounts may vary at other pharmacies due to pharmacy/plan contracts, or as the patient moves through the different stages of their insurance plan.  Lyndel Safe, [place] Patient Advocate Specialist [place] Patient Advocate Team Direct Number: [phone]  Fax: [phone]

## 2022-07-23 NOTE — Assessment & Plan Note (Addendum)
Patient with improved rate control, telemetry personally reviewed with persistent atrial fibrillation with rate 80 to 90.  Echocardiogram with preserved LV systolic function with EF 60 to 65%, mild LVH, RV with normal systolic function, RVSP [id number] mmHg. No significant valvular disease and no wall motion abnormalities.   Plan to continue rate control with metoprolol. Cha2Ds2Vasc score is 3, patient started on apixaban for anticoagulation.  TSH was normal last year, follow up TSH as outpatient.  Follow up with cardiology as outpatient.  Heart failure ruled out.

## 2022-07-23 NOTE — Assessment & Plan Note (Signed)
Continue statin therapy.

## 2022-07-23 NOTE — ED Notes (Signed)
Pt back NAD denies any complaints will continue to monitor

## 2022-07-23 NOTE — Assessment & Plan Note (Signed)
Continue antiacid therapy  ?

## 2022-07-23 NOTE — Assessment & Plan Note (Signed)
Continue blood pressure control with lisinopril 20 mg daily. Discontinue verapamil and started on metoprolol for blood pressure control.

## 2022-07-23 NOTE — Hospital Course (Addendum)
Matthew Mckay was admitted to the hospital with the working diagnosis of syncope.   82 yo male with the past medical history of hypertension, BPH and dyslipidemia who presented after a syncope episode. Patient sustained a syncope episode while moving his bowels in the bathroom. Loss of consciousness preceded by light headedness. After recovering his consciousness he continue to be weak and lightheaded, EMS was called and he was found bradycardic in the 30's. In the ED his blood pressure was 90, heart rate 50 to 60, 02 saturation 100% and RR 15, dry mucous membranes, heart with S1 and S2 present, irregularly irregular, respiratory with no rales or wheezing, abdomen with no distention and no lower extremity edema.   Na 140, K 4,5 Cl 104 bicarbonate 23, glucose 173, bun 27 cr 1,58 Wbc 11,7 hgb 13.2 plt 199  Urine analysis SG 1,016, protein 30, negative leukocytes.   Chest radiograph with cardiomegaly with no infiltrates or effusions.   EKG 58 bpm, normal axis, right bindle branch block, qtc 533, atrial fibrillation rhythm with no significant ST segment or T wave changes.   Echocardiogram with preserved LV systolic function with no wall motion abnormalities.   Patient was placed on metoprolol and apixaban.

## 2022-07-23 NOTE — ED Notes (Signed)
ED TO INPATIENT HANDOFF REPORT  ED Nurse Name and Phone #: Waynetta Sandy [phone]  S Name/Age/Gender Matthew Mckay 82 y.o. male Room/Bed: [id number]  Code Status   Code Status: Full Code  Home/SNF/Other Home Patient oriented to: self, place, time, and situation Is this baseline? Yes   Triage Complete: Triage complete  Chief Complaint Afib [I48.91] CHF (congestive heart failure) [I50.9]  Triage Note Pt BIB [facility] from work after vagaling during BM causing paleness, diaphoresis, and lethargy. Initial HR in the 30s and irregular. Nauseous earlier today as well but no SOB, chest pain. HR currently 57, remains irregular and no hx of afib. EMS administered 555mL NS en route, BP 122/54. A&OX4, no pain.   Allergies Allergies  Allergen Reactions   Pollen Extract-Tree Extract [Pollen Extract]     Level of Care/Admitting Diagnosis ED Disposition     ED Disposition  Admit   Condition  --   Comment  Hospital Area: [facility] [[id number]]  Level of Care: Telemetry Cardiac [103]  May place patient in observation at [facility] or [facility] if equivalent level of care is available:: No  Covid Evaluation: Asymptomatic - no recent exposure (last 10 days) testing not required  Diagnosis: CHF (congestive heart failure) [id number]  Admitting Physician: Lequita Halt [id number]  Attending Physician: Lequita Halt [id number]          B Medical/Surgery History Past Medical History:  Diagnosis Date   Allergy    Anxiety    Chronic sinusitis    Diverticulosis    sigmoid colon and increased vascularization due to prior radiation, per 2013 colonoscopy   GERD (gastroesophageal reflux disease)    Hemorrhoid    Hyperlipidemia    Hypertension    Prostate cancer    history of radiation therapy   Seasonal allergies    Urinary retention 2019   Past Surgical History:  Procedure Laterality Date   COLONOSCOPY  02/2012   sigmoid diverticulosis, polyps, Dr. Clarene Essex    CYSTOSCOPY WITH URETHRAL DILATATION N/A 02/10/2019   Procedure: CYSTOSCOPY WITH URETHRAL DILATATION,;  Surgeon: Alexis Frock, MD;  Location: [facility];  Service: Urology;  Laterality: N/A;   PROSTATECTOMY     1990s     A IV Location/Drains/Wounds Patient Lines/Drains/Airways Status     Active Line/Drains/Airways     Name Placement date Placement time Site Days   Peripheral IV 07/22/22 18 G Left Antecubital 07/22/22  1300  Antecubital  1   Incision (Closed) 02/10/19 Perineum 02/10/19  1659  -- 1259            Intake/Output Last 24 hours No intake or output data in the 24 hours ending 07/23/22 0721  Labs/Imaging Results for orders placed or performed during the hospital encounter of 07/22/22 (from the past 48 hour(s))  Comprehensive metabolic panel     Status: Abnormal   Collection Time: 07/22/22  1:00 PM  Result Value Ref Range   Sodium 140 135 - 145 mmol/L   Potassium 4.5 3.5 - 5.1 mmol/L   Chloride 104 98 - 111 mmol/L   CO2 23 22 - 32 mmol/L   Glucose, Bld 173 (H) 70 - 99 mg/dL    Comment: Glucose reference range applies only to samples taken after fasting for at least 8 hours.   BUN 27 (H) 8 - 23 mg/dL   Creatinine, Ser 1.58 (H) 0.61 - 1.24 mg/dL   Calcium 8.9 8.9 - 10.3 mg/dL   Total Protein 6.6 6.5 -  8.1 g/dL   Albumin 3.3 (L) 3.5 - 5.0 g/dL   AST 18 15 - 41 U/L   ALT 13 0 - 44 U/L   Alkaline Phosphatase 49 38 - 126 U/L   Total Bilirubin 0.6 0.3 - 1.2 mg/dL   GFR, Estimated 43 (L) >60 mL/min    Comment: (NOTE) Calculated using the CKD-EPI Creatinine Equation (2021)    Anion gap 13 5 - 15    Comment: Performed at [facility] [address], [place], [place] [address]  CBC WITH DIFFERENTIAL     Status: Abnormal   Collection Time: 07/22/22  1:00 PM  Result Value Ref Range   WBC 11.7 (H) 4.0 - 10.5 K/uL   RBC 4.54 4.22 - 5.81 MIL/uL   Hemoglobin 13.2 13.0 - 17.0 g/dL   HCT 42.5 39.0 - 52.0 %   MCV 93.6 80.0 - 100.0 fL   MCH 29.1 26.0 - 34.0 pg    MCHC 31.1 30.0 - 36.0 g/dL   RDW 13.0 11.5 - 15.5 %   Platelets 199 150 - 400 K/uL   nRBC 0.0 0.0 - 0.2 %   Neutrophils Relative % 71 %   Neutro Abs 8.2 (H) 1.7 - 7.7 K/uL   Lymphocytes Relative 20 %   Lymphs Abs 2.4 0.7 - 4.0 K/uL   Monocytes Relative 7 %   Monocytes Absolute 0.8 0.1 - 1.0 K/uL   Eosinophils Relative 2 %   Eosinophils Absolute 0.3 0.0 - 0.5 K/uL   Basophils Relative 0 %   Basophils Absolute 0.0 0.0 - 0.1 K/uL   Immature Granulocytes 0 %   Abs Immature Granulocytes 0.05 0.00 - 0.07 K/uL    Comment: Performed at [facility] [address] [place], [place] [address]  Urinalysis, Routine w reflex microscopic -Urine, Clean Catch     Status: Abnormal   Collection Time: 07/23/22  4:00 AM  Result Value Ref Range   Color, Urine AMBER (A) YELLOW    Comment: BIOCHEMICALS MAY BE AFFECTED BY COLOR   APPearance HAZY (A) CLEAR   Specific Gravity, Urine 1.016 1.005 - 1.030   pH 5.0 5.0 - 8.0   Glucose, UA NEGATIVE NEGATIVE mg/dL   Hgb urine dipstick NEGATIVE NEGATIVE   Bilirubin Urine NEGATIVE NEGATIVE   Ketones, ur NEGATIVE NEGATIVE mg/dL   Protein, ur 30 (A) NEGATIVE mg/dL   Nitrite NEGATIVE NEGATIVE   Leukocytes,Ua NEGATIVE NEGATIVE   RBC / HPF 0-5 0 - 5 RBC/hpf   WBC, UA 0-5 0 - 5 WBC/hpf   Bacteria, UA RARE (A) NONE SEEN   Squamous Epithelial / HPF 0-5 0 - 5 /HPF   Mucus PRESENT    Hyaline Casts, UA PRESENT    Ca Oxalate Crys, UA PRESENT     Comment: Performed at [facility], [address] [place],  [id number]  Basic metabolic panel     Status: Abnormal   Collection Time: 07/23/22  5:15 AM  Result Value Ref Range   Sodium 138 135 - 145 mmol/L   Potassium 3.8 3.5 - 5.1 mmol/L   Chloride 105 98 - 111 mmol/L   CO2 25 22 - 32 mmol/L   Glucose, Bld 101 (H) 70 - 99 mg/dL    Comment: Glucose reference range applies only to samples taken after fasting for at least 8 hours.   BUN 21 8 - 23 mg/dL   Creatinine, Ser 1.03 0.61 - 1.24 mg/dL    Calcium 8.7 (L)  8.9 - 10.3 mg/dL   GFR, Estimated >60 >60 mL/min    Comment: (NOTE) Calculated using the CKD-EPI Creatinine Equation (2021)    Anion gap 8 5 - 15    Comment: Performed at [facility], [place] [address] [place], [place] [address]   US RENAL  Result Date: 07/22/2022 CLINICAL DATA:  Acute renal insufficiency EXAM: RENAL / URINARY TRACT ULTRASOUND COMPLETE COMPARISON:  05/02/2021 FINDINGS: Right Kidney: Renal measurements: 11.0 x 6.2 x 5.3 cm = volume: 188.6 mL. Mild increased renal cortical echotexture. No hydronephrosis. There are numerous simple right renal cortical cysts, largest measuring 6.0 cm within the mid right kidney. Left Kidney: Renal measurements: 10.9 x 6.4 by 5.7 cm = volume: 207.6 mL. Mild increased renal cortical echotexture. No hydronephrosis. Numerous simple benign renal cortical cysts, largest in the mid left kidney measuring 4 cm. Bladder: Appears normal for degree of bladder distention. Other: None. IMPRESSION: 1. Mild bilateral increased renal cortical echotexture consistent with medical renal disease. 2. Simple bilateral renal cortical cysts, benign. No specific imaging follow-up is recommended. Electronically Signed   By: Randa Ngo M.D.   On: 07/22/2022 19:37   DG Chest Port 1 View  Result Date: 07/22/2022 CLINICAL DATA:  Paleness, diaphoresis, lethargy, bradycardia, vagal during bowel movement EXAM: PORTABLE CHEST 1 VIEW COMPARISON:  Portable exam 1306 hours compared to 09/21/2016 FINDINGS: Enlargement of cardiac silhouette. Mediastinal contours and pulmonary vascularity normal. Atherosclerotic calcification aorta. Lungs clear. No pulmonary infiltrate, pleural effusion, or pneumothorax. Osseous structures unremarkable. IMPRESSION: Enlargement of cardiac silhouette without acute abnormalities. Aortic Atherosclerosis (ICD10-I70.0). Electronically Signed   By: Lavonia Dana M.D.   On: 07/22/2022 13:12    Pending Labs Unresulted Labs (From admission, onward)      Start     Ordered   07/22/22 1616  TSH  Add-on,   AD        07/22/22 1615   07/22/22 1616  T4, free  Add-on,   AD        07/22/22 1615            Vitals/Pain Today's Vitals   07/23/22 0214 07/23/22 0434 07/23/22 0500 07/23/22 0529  BP:   (!) 158/96   Pulse:   (!) 117   Resp:   17   Temp:  98.9 F (37.2 C)    TempSrc:  Oral    SpO2:   95%   Weight:    80.7 kg  Height:    5\' 9"  (1.753 m)  PainSc: Asleep       Isolation Precautions No active isolations  Medications Medications  0.9 %  sodium chloride infusion ( Intravenous New Bag/Given 07/23/22 0528)  atorvastatin (LIPITOR) tablet 10 mg (10 mg Oral Given 07/22/22 1830)  famotidine (PEPCID) tablet 40 mg (40 mg Oral Given 07/22/22 1830)  pantoprazole (PROTONIX) EC tablet 40 mg (40 mg Oral Given 07/22/22 1830)  fluticasone (FLONASE) 50 MCG/ACT nasal spray 2 spray (2 sprays Each Nare Not Given 07/22/22 2117)  olopatadine (PATANOL) 0.1 % ophthalmic solution 1 drop (1 drop Both Eyes Not Given 07/22/22 2142)  sodium chloride flush (NS) 0.9 % injection 3 mL (3 mLs Intravenous Given 07/22/22 2125)  loratadine (CLARITIN) tablet 10 mg (10 mg Oral Given 07/22/22 1830)  metoprolol tartrate (LOPRESSOR) injection 2.5 mg (has no administration in time range)  apixaban (ELIQUIS) tablet 2.5 mg (2.5 mg Oral Given 07/22/22 1830)    Mobility walks     Focused Assessments Cardiac Assessment Handoff:  Cardiac Rhythm: Atrial  fibrillation No results found for: "CKTOTAL", "CKMB", "CKMBINDEX", "TROPONINI" No results found for: "DDIMER" Does the Patient currently have chest pain? No    R Recommendations: See Admitting Provider Note  Report given to:   Additional Notes: Pt reports that he had a hard time sleeping last night

## 2022-07-23 NOTE — Discharge Instructions (Signed)

## 2022-07-23 NOTE — Assessment & Plan Note (Signed)
Patient was placed on isotonic IV fluids, follow up renal function with serum cr at 1.0 with K at 3,8 and serum bicarbonate at 25. Plan to resume lisinopril at the time of discharge and follow up renal function as outpatient.

## 2022-07-24 ENCOUNTER — Encounter: Payer: Self-pay | Admitting: Medical

## 2022-07-24 ENCOUNTER — Ambulatory Visit (INDEPENDENT_AMBULATORY_CARE_PROVIDER_SITE_OTHER): Payer: Medicare Other | Admitting: Medical

## 2022-07-24 ENCOUNTER — Telehealth: Payer: Self-pay

## 2022-07-24 VITALS — BP 112/68 | HR 62 | Ht 69.0 in | Wt 179.6 lb

## 2022-07-24 DIAGNOSIS — I4891 Unspecified atrial fibrillation: Secondary | ICD-10-CM | POA: Diagnosis not present

## 2022-07-24 DIAGNOSIS — K5909 Other constipation: Secondary | ICD-10-CM

## 2022-07-24 DIAGNOSIS — I779 Disorder of arteries and arterioles, unspecified: Secondary | ICD-10-CM

## 2022-07-24 DIAGNOSIS — I10 Essential (primary) hypertension: Secondary | ICD-10-CM

## 2022-07-24 DIAGNOSIS — K649 Unspecified hemorrhoids: Secondary | ICD-10-CM

## 2022-07-24 NOTE — Progress Notes (Signed)
Subjective: Chief Complaint  Patient presents with   Hospitalization Follow-up    Hospital follow up.    Here today with daughter Annice PihJackie.  His son Jonny RuizJohn in [place], [place] also was on the speaker phone  He is here for hospital follow-up.  He was seen in emergency department and admitted from 07/22/2022 through 07/23/22.  He just got home from the hospital yesterday.  On the day he was admitted he was at work, was leaning over his chair feeling fatigued and weak.  EMS was called and he was transported to the hospital where he was found to be in A-fib.  He had scans and echocardiogram of the heart and labs.  He was started on medication.  He had been drinking a lot of Pepsi and apparently does not drink water at all.  He had gotten dehydrated  He also has a history of constipation and hemorrhoids  He is supposed to have follow-up with cardiology in 3 weeks  No new symptoms today.  Past Medical History:  Diagnosis Date   Allergy    Anxiety    Chronic sinusitis    Diverticulosis    sigmoid colon and increased vascularization due to prior radiation, per 2013 colonoscopy   GERD (gastroesophageal reflux disease)    Hemorrhoid    Hyperlipidemia    Hypertension    Prostate cancer    history of radiation therapy   Seasonal allergies    Urinary retention 2019   Current Outpatient Medications on File Prior to Visit  Medication Sig Dispense Refill   apixaban (ELIQUIS) 5 MG TABS tablet Take 1 tablet (5 mg total) by mouth 2 (two) times daily. 60 tablet 0   atorvastatin (LIPITOR) 10 MG tablet Take 1 tablet (10 mg total) by mouth daily. 90 tablet 3   Cholecalciferol (VITAMIN D3) 75 MCG (3000 UT) TABS Take 75 mcg by mouth daily.     fluticasone (FLONASE) 50 MCG/ACT nasal spray SPRAY 2 SPRAYS INTO EACH NOSTRIL EVERY DAY (Patient taking differently: Place 2 sprays into both nostrils daily. SPRAY 2 SPRAYS INTO EACH NOSTRIL EVERY DAY) 48 mL 11   levocetirizine (XYZAL) 5 MG tablet TAKE 1 TABLET BY MOUTH  EVERY DAY IN THE EVENING 90 tablet 3   lisinopril (ZESTRIL) 20 MG tablet Take 1 tablet (20 mg total) by mouth daily. 90 tablet 3   metoprolol succinate (TOPROL-XL) 50 MG 24 hr tablet Take 1 tablet (50 mg total) by mouth daily. Take with or immediately following a meal. 30 tablet 0   omeprazole (PRILOSEC) 40 MG capsule TAKE 1 CAPSULE BY MOUTH EVERY DAY (Patient taking differently: Take 40 mg by mouth daily. TAKE 1 CAPSULE BY MOUTH EVERY DAY) 90 capsule 3   famotidine (PEPCID) 40 MG tablet TAKE 1 TABLET BY MOUTH EVERY DAY (Patient not taking: Reported on 07/24/2022) 90 tablet 3   polyethylene glycol powder (GLYCOLAX/MIRALAX) powder 1 capfull daily in beverage (Patient not taking: Reported on 07/24/2022) 3350 g 3   No current facility-administered medications on file prior to visit.    Review of systems as in subjective   Objective: BP 112/68   Pulse 62   Ht 5\' 9"  (1.753 m)   Wt 179 lb 9.6 oz (81.5 kg)   BMI 26.52 kg/m   General: Well-developed well-nourished no acute distress Heart: Occasional skipped beat, some irregularity but also some runs of normal beats, no murmur Lungs clear No leg edema or palpable cord or swelling Pulses within normal limits Psych: Pleasant, answers questions appropriately  Assessment: Encounter Diagnoses  Name Primary?   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  I reviewed his recent imaging, echocardiogram, labs.  He is only taken 1 dose of the Eliquis so far.  We discussed the medications of the new changes and he will go home and tonight take the second dose of Eliquis and continue the regimen as outlined below.  I had reviewed the medicines and make sure he was understanding that he needed to stop verapamil since they have  started him on metoprolol now.  We discussed the risk of Eliquis.  He has had some hemorrhoids in recent months as well as constipation.  We discussed if any significant bleeding in the next few days to call back right away  He has follow-up planned in 3 weeks with cardiology  We discussed the need to hydrate well with water and cut out the soda.  We discussed avoiding constipation with good water intake, fiber supplement and can use MiraLAX daily for the time being  We discussed the recommendations as below  Patient Instructions  Recommendations:  Follow up as planned in 3 weeks with cardiology  Dr. Carolan Clines Address: [address], [place], [place] [address] Phone: [phone]   [facility] Address: [address] [place], [place], [place] [address] Phone: [phone]   If any significant bleeding or bruising in the short term, call us, cardiology or go to emergency department    Try to drink at least 80 -100 ounces of water daily  Take a fiber supplement daily such as Fibercon and Metamucil   STOP Verapamil 240mg  CR daily   NEW MEDICATIONS Eliquis Apixaban 5mg  twice daily  Metoprolol XL 50mg  daily in the morning   All other medications as usual Miralax powder daily to help avoid constipation apixaban (ELIQUIS) 5 MG TABS tablet atorvastatin (LIPITOR) 10 MG tablet Cholecalciferol (VITAMIN D3) 75 MCG (3000 UT) TABS fluticasone (FLONASE) 50 MCG/ACT nasal spray levocetirizine (XYZAL) 5 MG tablet lisinopril (ZESTRIL) 20 MG tablet metoprolol succinate (TOPROL-XL) 50 MG 24 hr tablet omeprazole (PRILOSEC) 40 MG capsule famotidine (PEPCID) 40 MG tablet polyethylene glycol powder (GLYCOLAX/MIRALAX) powder   F/u with cardiology as planned

## 2022-07-24 NOTE — Transitions of Care (Post Inpatient/ED Visit) (Signed)
   07/24/2022  Name: Matthew Mckay MRN: [id number] DOB: 08/18/1940  Today's TOC FU Call Status: Today's TOC FU Call Status:: Unsuccessul Call (1st Attempt) Unsuccessful Call (1st Attempt) Date: 07/24/22  Attempted to reach the patient regarding the most recent Inpatient/ED visit.  Follow Up Plan: Additional outreach attempts will be made to reach the patient to complete the Transitions of Care (Post Inpatient/ED visit) call.     Antionette Fairy, RN,BSN,CCM [facility] Care Management Care Management Community Coordinator Direct Phone: [phone] Toll Free: [phone] Fax: [phone]

## 2022-07-24 NOTE — Patient Instructions (Addendum)
Recommendations:  Follow up as planned in 3 weeks with cardiology  Dr. Carolan Clines Address: [address], [place], [place] [address] Phone: [phone]   [facility] Address: [address] [place], [place], [place] [address] Phone: [phone]   If any significant bleeding or bruising in the short term, call us, cardiology or go to emergency department    Try to drink at least 80 -100 ounces of water daily  Take a fiber supplement daily such as Fibercon and Metamucil   STOP Verapamil 240mg  CR daily   NEW MEDICATIONS Eliquis Apixaban 5mg  twice daily  Metoprolol XL 50mg  daily in the morning   All other medications as usual Miralax powder daily to help avoid constipation apixaban (ELIQUIS) 5 MG TABS tablet atorvastatin (LIPITOR) 10 MG tablet Cholecalciferol (VITAMIN D3) 75 MCG (3000 UT) TABS fluticasone (FLONASE) 50 MCG/ACT nasal spray levocetirizine (XYZAL) 5 MG tablet lisinopril (ZESTRIL) 20 MG tablet metoprolol succinate (TOPROL-XL) 50 MG 24 hr tablet omeprazole (PRILOSEC) 40 MG capsule famotidine (PEPCID) 40 MG tablet polyethylene glycol powder (GLYCOLAX/MIRALAX) powder

## 2022-07-24 NOTE — Progress Notes (Signed)
Thyroid labs okay.  Make sure he has a follow-up visit scheduled.  I did review the hospital records where he was seen in emergency department recently

## 2022-07-27 ENCOUNTER — Inpatient Hospital Stay: Payer: Medicare Other | Admitting: Medical

## 2022-07-27 ENCOUNTER — Telehealth: Payer: Self-pay

## 2022-07-27 NOTE — Transitions of Care (Post Inpatient/ED Visit) (Signed)
   07/27/2022  Name: Matthew Mckay MRN: [id number] DOB: [birth year]  Today's TOC FU Call Status: Today's TOC FU Call Status:: Successful TOC FU Call Competed TOC FU Call Complete Date: 07/27/22  Transition Care Management Follow-up Telephone Call Date of Discharge: 07/23/22 Discharge Facility: [facility]) Type of Discharge: Inpatient Admission Primary Inpatient Discharge Diagnosis:: "new onset A-fib" How have you been since you were released from the hospital?: Better (Patient states he is doing really well-just finished breakfast and morning meds.) Any questions or concerns?: No  Items Reviewed: Did you receive and understand the discharge instructions provided?: Yes Medications obtained and verified?: Yes (Medications Reviewed) Any new allergies since your discharge?: No Dietary orders reviewed?: Yes Type of Diet Ordered:: low salt/heart healthy Do you have support at home?: Yes People in Home: alone, child(ren), adult Name of Support/Comfort Primary Source: Jackie-daughter  Home Care and Equipment/Supplies: Were Home Health Services Ordered?: NA Any new equipment or medical supplies ordered?: NA  Functional Questionnaire: Do you need assistance with bathing/showering or dressing?: No Do you need assistance with meal preparation?: No Do you need assistance with eating?: No Do you have difficulty maintaining continence: No Do you need assistance with getting out of bed/getting out of a chair/moving?: No Do you have difficulty managing or taking your medications?: No  Follow up appointments reviewed: PCP Follow-up appointment confirmed?: Yes Date of PCP follow-up appointment?: 07/24/22 Follow-up Provider: Moses Manners Specialist [facility] Follow-up appointment confirmed?: No Reason Specialist Follow-Up Not Confirmed: Patient has Specialist Provider Number and will Call for Appointment (patient states daughter makes appts-thinks she has scheduled appt but not sure-will  talk with daughter about it to verify) Do you need transportation to your follow-up appointment?: No (family takes him to appts) Do you understand care options if your condition(s) worsen?: Yes-patient verbalized understanding  SDOH Interventions Today    Flowsheet Row Most Recent Value  SDOH Interventions   Food Insecurity Interventions Intervention Not Indicated  Transportation Interventions Intervention Not Indicated      TOC Interventions Today    Flowsheet Row Most Recent Value  TOC Interventions   TOC Interventions Discussed/Reviewed TOC Interventions Discussed      Interventions Today    Flowsheet Row Most Recent Value  General Interventions   General Interventions Discussed/Reviewed General Interventions Discussed, Doctor Visits  Doctor Visits Discussed/Reviewed PCP, Doctor Visits Discussed, Specialist  PCP/Specialist Visits Compliance with follow-up visit  Education Interventions   Education Provided Provided Education  Provided Verbal Education On Nutrition, When to see the doctor, Other, Medication  Nutrition Interventions   Nutrition Discussed/Reviewed Nutrition Discussed, Adding fruits and vegetables, Fluid intake, Decreasing salt  Pharmacy Interventions   Pharmacy Dicussed/Reviewed Pharmacy Topics Discussed, Medications and their functions  Safety Interventions   Safety Discussed/Reviewed Safety Discussed       Alessandra Grout [facility] Care Management Care Management Community Coordinator Direct Phone: [phone] Toll Free: [phone] Fax: [phone]

## 2022-08-07 DIAGNOSIS — C44319 Basal cell carcinoma of skin of other parts of face: Secondary | ICD-10-CM | POA: Diagnosis not present

## 2022-08-07 DIAGNOSIS — C4441 Basal cell carcinoma of skin of scalp and neck: Secondary | ICD-10-CM | POA: Diagnosis not present

## 2022-08-07 DIAGNOSIS — L821 Other seborrheic keratosis: Secondary | ICD-10-CM | POA: Diagnosis not present

## 2022-08-21 ENCOUNTER — Other Ambulatory Visit: Payer: Self-pay

## 2022-08-21 ENCOUNTER — Telehealth: Payer: Self-pay | Admitting: Medical

## 2022-08-21 DIAGNOSIS — C441192 Basal cell carcinoma of skin of left lower eyelid, including canthus: Secondary | ICD-10-CM | POA: Diagnosis not present

## 2022-08-21 DIAGNOSIS — C441122 Basal cell carcinoma of skin of right lower eyelid, including canthus: Secondary | ICD-10-CM | POA: Diagnosis not present

## 2022-08-21 DIAGNOSIS — I4891 Unspecified atrial fibrillation: Secondary | ICD-10-CM

## 2022-08-21 DIAGNOSIS — R001 Bradycardia, unspecified: Secondary | ICD-10-CM

## 2022-08-21 DIAGNOSIS — Z01818 Encounter for other preprocedural examination: Secondary | ICD-10-CM | POA: Diagnosis not present

## 2022-08-21 NOTE — Telephone Encounter (Signed)
[facility] called and said they can not see the pt on may the 23rd pt was supposed to be cleared by the Cardiologist office and pt has not been to them, according to the last office note per shane he was supposed to follow up with them in 3 weeks and pt has not, Morrie Sheldon can be reached at [phone] and her extension is [phone]

## 2022-08-24 ENCOUNTER — Telehealth: Payer: Self-pay

## 2022-08-24 ENCOUNTER — Other Ambulatory Visit: Payer: Self-pay | Admitting: Medical

## 2022-08-24 MED ORDER — ATORVASTATIN CALCIUM 10 MG PO TABS: 10.0000 mg | ORAL_TABLET | Freq: Every day | ORAL | 3 refills | Status: DC

## 2022-08-24 MED ORDER — METOPROLOL SUCCINATE ER 50 MG PO TB24: 50.0000 mg | ORAL_TABLET | Freq: Every day | ORAL | 0 refills | Status: DC

## 2022-08-24 MED ORDER — APIXABAN 5 MG PO TABS: 5.0000 mg | ORAL_TABLET | Freq: Two times a day (BID) | ORAL | 0 refills | Status: DC

## 2022-08-24 NOTE — Telephone Encounter (Signed)
Spoke to [place] at [facility] and she said that pt is scheduled for May 24th and June 7th but it would be up to cardiology to decide if he needs to stop Eliquis or postpone surgery  Spoke with Son and he states that his eye is doing better so advised him to find out from cardiology and then contact [facility] if he needs to postpone surgery or stick with it.

## 2022-08-24 NOTE — Telephone Encounter (Signed)
Spoke with son, Molly Maduro. He called to advise the pt is needing a refill of eliquis, metoprolol, and atorvastatin. He also advised the pt is scheduled for eye surgery but the pt can't take eliquis for the risk of bleeding. He stated the doctor doing the surgery is with [facility] so we should be able to obtain/see records. He is requesting a call back at [phone] if you have time today to speak with him to discuss the transition of care between providers.

## 2022-08-24 NOTE — Telephone Encounter (Signed)
Called [facility] for his eyelids and patient is scheduled for surgery on May 24th. They will contact me by about when they would need to come off Eliquis

## 2022-08-26 ENCOUNTER — Telehealth: Payer: Self-pay | Admitting: *Deleted

## 2022-08-26 NOTE — Telephone Encounter (Signed)
   Pre-operative Risk Assessment    Patient Name: Matthew Mckay  DOB: 29-May-1940 MRN: [id number]      Request for Surgical Clearance    Procedure:   RLL RECONSTRUCTION AFTER MOHS   Date of Surgery:  Clearance 09/11/22                                 Surgeon:  DR. Art Buff Surgeon's Group or Practice Name:  [facility] Phone number:  [phone] Fax number:  [phone]   Type of Clearance Requested:   - Medical  - Pharmacy:  Hold Apixaban (Eliquis) X'S 2 DAYS BEFORE SURGERY POD 3 DAYS, TOTAL 4 DAYS    Type of Anesthesia:  General    Additional requests/questions:    Wilhemina Cash   08/26/2022, 9:45 AM

## 2022-08-26 NOTE — Telephone Encounter (Signed)
Lvm for pt to call office to schedule in office appt before surgery.

## 2022-08-26 NOTE — Telephone Encounter (Signed)
   Name: Matthew Mckay  DOB: [birth year]  MRN: [id number]  Primary Cardiologist: None  Chart reviewed as part of pre-operative protocol coverage. Because of [name] past medical history and time since last visit, he will require a follow-up in-office visit in order to better assess preoperative cardiovascular risk.   Patient is scheduled with Afib clinic but not general cardiology after hospital admission.   Pre-op covering staff: - Please schedule appointment and call patient to inform them. If patient already had an upcoming appointment within acceptable timeframe, please add "pre-op clearance" to the appointment notes so provider is aware. - Please contact requesting surgeon's office via preferred method (i.e, phone, fax) to inform them of need for appointment prior to surgery.  This message will also be routed to pharmacy pool or input on holding Eliquis as requested below so that this information is available to the clearing provider at time of patient's appointment.   [name], NP  08/26/2022, 1:19 PM

## 2022-08-27 NOTE — Telephone Encounter (Signed)
Pt has never been seen by [facility]. Has a visit with afib clinic on 5/13. Will provide clearance after this visit.

## 2022-08-28 ENCOUNTER — Ambulatory Visit (HOSPITAL_COMMUNITY): Payer: Medicare Other | Admitting: Internal Medicine

## 2022-08-31 ENCOUNTER — Ambulatory Visit (HOSPITAL_COMMUNITY)
Admission: RE | Admit: 2022-08-31 | Discharge: 2022-08-31 | Disposition: A | Discharge status: Home / Self Care | Payer: Medicare Other | Source: Ambulatory Visit | Attending: Internal Medicine | Admitting: Internal Medicine

## 2022-08-31 VITALS — BP 164/96 | HR 60 | Ht 69.0 in | Wt 177.0 lb

## 2022-08-31 DIAGNOSIS — C7982 Secondary malignant neoplasm of genital organs: Secondary | ICD-10-CM | POA: Insufficient documentation

## 2022-08-31 DIAGNOSIS — E785 Hyperlipidemia, unspecified: Secondary | ICD-10-CM | POA: Insufficient documentation

## 2022-08-31 DIAGNOSIS — I4819 Other persistent atrial fibrillation: Secondary | ICD-10-CM | POA: Insufficient documentation

## 2022-08-31 DIAGNOSIS — I4891 Unspecified atrial fibrillation: Secondary | ICD-10-CM | POA: Diagnosis not present

## 2022-08-31 DIAGNOSIS — R0683 Snoring: Secondary | ICD-10-CM | POA: Diagnosis not present

## 2022-08-31 DIAGNOSIS — I10 Essential (primary) hypertension: Secondary | ICD-10-CM | POA: Diagnosis not present

## 2022-08-31 DIAGNOSIS — Z7901 Long term (current) use of anticoagulants: Secondary | ICD-10-CM | POA: Diagnosis not present

## 2022-08-31 DIAGNOSIS — D6869 Other thrombophilia: Secondary | ICD-10-CM | POA: Insufficient documentation

## 2022-08-31 LAB — BASIC METABOLIC PANEL
Anion gap: 8 (ref 5–15)
BUN: 18 mg/dL (ref 8–23)
CO2: 26 mmol/L (ref 22–32)
Calcium: 9.1 mg/dL (ref 8.9–10.3)
Chloride: 106 mmol/L (ref 98–111)
Creatinine, Ser: 0.84 mg/dL (ref 0.61–1.24)
GFR, Estimated: >60 mL/min (ref >60)
Glucose, Bld: 96 mg/dL (ref 70–99)
Potassium: 3.7 mmol/L (ref 3.5–5.1)
Sodium: 140 mmol/L (ref 135–145)

## 2022-08-31 LAB — CBC
HCT: 41 % (ref 39.0–52.0)
Hemoglobin: 13 g/dL (ref 13.0–17.0)
MCH: 28.6 pg (ref 26.0–34.0)
MCHC: 31.7 g/dL (ref 30.0–36.0)
MCV: 90.3 fL (ref 80.0–100.0)
Platelets: 230 10*3/uL (ref 150–400)
RBC: 4.54 MIL/uL (ref 4.22–5.81)
RDW: 13.3 % (ref 11.5–15.5)
WBC: 7.4 10*3/uL (ref 4.0–10.5)
nRBC: 0 % (ref 0.0–0.2)

## 2022-08-31 NOTE — Telephone Encounter (Signed)
Pt seen by afib clinic today, scheduled for cardioversion on 09/02/22 and has follow up afib clinic visit on 5/23. Cannot hold anticoag for 3 weeks before and 4 weeks after cardioversion - procedure date will need to be moved back. Would wait to reassess at follow up visit in case further procedures are scheduled.

## 2022-08-31 NOTE — H&P (View-Only) (Signed)
  Primary Care Physician: Tysinger, David S, PA-C Primary Cardiologist: None Primary Electrophysiologist: None Referring Physician: Dr. Arrien   Matthew Mckay is a 81 y.o. male with a history of HTN, prostate cancer, dyslipidemia, syncope, and atrial fibrillation who presents for consultation in the [facility]. Patient was admitted 4/3-4/24 for syncopal episode while having a bowel movement. It was thought episode was vasovagal and no indication for PPM. ECG showed rate controlled Afib. Echo showed normal EF 60-65%. He was discharged on Eliquis 5 mg BID and Toprol 50 mg daily. Patient is on Eliquis for a CHADS2VASC score of 3.  On evaluation today, he is currently in rate controlled Afib. He does not have cardiac awareness of his Afib. He has been taking Eliquis 5mg BID since hospital discharge on 07/23/22. He does not feel more tired or short of breath.  He does not drink alcohol. He does not drink a lot of caffeinated beverages.  He is a widow so no one at home to know if he stops breathing. He doesn't think he snores and wakes up feeling well rested.  He is compliant with anticoagulation and has not missed any doses. He has no bleeding concerns.  Today, he denies symptoms of palpitations, chest pain, orthopnea, PND, lower extremity edema, dizziness, presyncope, syncope, snoring, daytime somnolence, bleeding, or neurologic sequela. The patient is tolerating medications without difficulties and is otherwise without complaint today.   Atrial Fibrillation Risk Factors:  he does not have symptoms or diagnosis of sleep apnea. he does not have a history of rheumatic fever. he does not have a history of alcohol use. The patient does not have a history of early familial atrial fibrillation or other arrhythmias.  he has a BMI of Body mass index is 26.14 kg/m.. Filed Weights   08/31/22 0940  Weight: 80.3 kg    Family History  Problem Relation Age of Onset    Cancer Brother        stomach   Arthritis Brother    Arthritis Brother    Hypertension Brother    Hypertension Brother    Hypertension Mother    Cancer Father        lung, smoked 2 ppd   Cancer Paternal Uncle        cancer   Cancer Paternal Uncle        stomach     Atrial Fibrillation Management history:  Previous antiarrhythmic drugs: None Previous cardioversions: None Previous ablations: None Anticoagulation history: Eliquis   Past Medical History:  Diagnosis Date   Allergy    Anxiety    Chronic sinusitis    Diverticulosis    sigmoid colon and increased vascularization due to prior radiation, per 2013 colonoscopy   GERD (gastroesophageal reflux disease)    Hemorrhoid    Hyperlipidemia    Hypertension    Prostate cancer (HCC)    history of radiation therapy   Seasonal allergies    Urinary retention 2019   Past Surgical History:  Procedure Laterality Date   COLONOSCOPY  02/2012   sigmoid diverticulosis, polyps, Dr. Marc Magod   CYSTOSCOPY WITH URETHRAL DILATATION N/A 02/10/2019   Procedure: CYSTOSCOPY WITH URETHRAL DILATATION,;  Surgeon: Manny, Theodore, MD;  Location: [facility];  Service: Urology;  Laterality: N/A;   PROSTATECTOMY     1990s    Current Outpatient Medications  Medication Sig Dispense Refill   apixaban (ELIQUIS) 5 MG TABS tablet Take 1 tablet (5 mg total) by mouth 2 (two) times   daily. 180 tablet 0   atorvastatin (LIPITOR) 10 MG tablet Take 1 tablet (10 mg total) by mouth daily. 90 tablet 3   Cholecalciferol (VITAMIN D3) 75 MCG (3000 UT) TABS Take 75 mcg by mouth daily.     famotidine (PEPCID) 40 MG tablet TAKE 1 TABLET BY MOUTH EVERY DAY 90 tablet 3   fluticasone (FLONASE) 50 MCG/ACT nasal spray SPRAY 2 SPRAYS INTO EACH NOSTRIL EVERY DAY (Patient taking differently: Place 2 sprays into both nostrils daily. SPRAY 2 SPRAYS INTO EACH NOSTRIL EVERY DAY) 48 mL 11   levocetirizine (XYZAL) 5 MG tablet TAKE 1 TABLET BY MOUTH EVERY DAY IN THE EVENING 90  tablet 3   lisinopril (ZESTRIL) 20 MG tablet Take 1 tablet (20 mg total) by mouth daily. 90 tablet 3   metoprolol succinate (TOPROL-XL) 50 MG 24 hr tablet Take 1 tablet (50 mg total) by mouth daily. Take with or immediately following a meal. 90 tablet 0   omeprazole (PRILOSEC) 40 MG capsule TAKE 1 CAPSULE BY MOUTH EVERY DAY (Patient taking differently: Take 40 mg by mouth daily. TAKE 1 CAPSULE BY MOUTH EVERY DAY) 90 capsule 3   polyethylene glycol powder (GLYCOLAX/MIRALAX) powder 1 capfull daily in beverage 3350 g 3   No current facility-administered medications for this encounter.    Allergies  Allergen Reactions   Pollen Extract-Tree Extract [Pollen Extract]     Social History   Socioeconomic History   Marital status: Married    Spouse name: Not on file   Number of children: Not on file   Years of education: Not on file   Highest education level: Not on file  Occupational History   Not on file  Tobacco Use   Smoking status: Never   Smokeless tobacco: Never  Vaping Use   Vaping Use: Never used  Substance and Sexual Activity   Alcohol use: No   Drug use: No   Sexual activity: Not on file  Other Topics Concern   Not on file  Social History Narrative   Lives alone. Widowed in 2009.   Handles own ADLs.   Watches [employer], always busy, yard work, garden work, works at [employer].    Goes to [employer].   6 brothers and 3 sisters.  Originally from [place], brought up in [place].  Father is [place] American, mother from [place]. Has family all over the place, [place], [place], [place], [place]   Social Determinants of Health   Financial Resource Strain: Low Risk  (10/10/2021)   Overall Financial Resource Strain (CARDIA)    Difficulty of Paying Living Expenses: Not hard at all  Food Insecurity: No Food Insecurity (07/27/2022)   Hunger Vital Sign    Worried About Running Out of Food in the Last Year: Never true    Ran Out of Food in the Last Year: Never true  Transportation Needs: No  Transportation Needs (07/27/2022)   PRAPARE - Transportation    Lack of Transportation (Medical): No    Lack of Transportation (Non-Medical): No  Physical Activity: Inactive (10/10/2021)   Exercise Vital Sign    Days of Exercise per Week: 0 days    Minutes of Exercise per Session: 0 min  Stress: No Stress Concern Present (10/10/2021)   [employer] - Occupational Stress Questionnaire    Feeling of Stress : Not at all  Social Connections: Not on file  Intimate Partner Violence: Not At Risk (07/22/2022)   Humiliation, Afraid, Rape, and Kick questionnaire      Fear of Current or Ex-Partner: No    Emotionally Abused: No    Physically Abused: No    Sexually Abused: No     ROS- All systems are reviewed and negative except as per the HPI above.  Physical Exam: Vitals:   08/31/22 0940  BP: (!) 164/96  Pulse: 60  Weight: 80.3 kg  Height: 5' 9" (1.753 m)    GEN- The patient is a well appearing male, alert and oriented x 3 today.   Head- normocephalic, atraumatic Eyes-  Sclera clear, conjunctiva pink Ears- hearing intact Oropharynx- clear Neck- supple  Lungs- Clear to ausculation bilaterally, normal work of breathing Heart- Irregular rate and rhythm, no murmurs, rubs or gallops  GI- soft, NT, ND, + BS Extremities- no clubbing, cyanosis, or edema MS- no significant deformity or atrophy Skin- no rash or lesion Psych- euthymic mood, full affect Neuro- strength and sensation are intact  Wt Readings from Last 3 Encounters:  08/31/22 80.3 kg  07/24/22 81.5 kg  07/23/22 80.7 kg    EKG today demonstrates  Vent. rate 60 BPM PR interval * ms QRS duration 96 ms QT/QTcB 424/424 ms P-R-T axes * 0 46 Atrial fibrillation with premature ventricular or aberrantly conducted complexes Incomplete right bundle branch block Abnormal ECG When compared with ECG of 22-Jul-2022 13:01, PREVIOUS ECG IS PRESENT  Echo 07/23/22 demonstrated:  1. Left ventricular ejection  fraction, by estimation, is 60 to 65%. The  left ventricle has normal function. The left ventricle has no regional  wall motion abnormalities. There is mild concentric left ventricular  hypertrophy. Left ventricular diastolic  parameters are indeterminate.   2. Right ventricular systolic function is normal. The right ventricular  size is normal. There is moderately elevated pulmonary artery systolic  pressure.   3. The mitral valve is normal in structure. Mild mitral valve  regurgitation. No evidence of mitral stenosis.   4. The aortic valve is tricuspid. There is mild calcification of the  aortic valve. Aortic valve regurgitation is mild. Aortic valve  sclerosis/calcification is present, without any evidence of aortic  stenosis. Aortic valve area, by VTI measures 2.27 cm.   Aortic valve mean gradient measures 5.0 mmHg. Aortic valve Vmax measures  1.52 m/s.   5. The inferior vena cava is dilated in size with >50% respiratory  variability, suggesting right atrial pressure of 8 mmHg.   Epic records are reviewed at length today.  CHA2DS2-VASc Score = 3  The patient's score is based upon: CHF History: 0 HTN History: 1 Diabetes History: 0 Stroke History: 0 Vascular Disease History: 0 Age Score: 2 Gender Score: 0       ASSESSMENT AND PLAN: Persistent Atrial Fibrillation (ICD10:  I48.19) The patient's CHA2DS2-VASc score is 3, indicating a 3.2% annual risk of stroke.    Education provided about Afib with visual diagram. Discussion about medication treatments and ablation in the future if indicated. After discussion, we will proceed with scheduling DCCV.   I will reach out to his ophthalmic surgeon Dr. Anthony Leonard at [facility] and confirm timing of DCCV vs timing of surgery is appropriate.  Will discuss method of home monitoring for rhythm at next OV.    2. Secondary Hypercoagulable State (ICD10:  D68.69) The patient is at significant risk for  stroke/thromboembolism based upon his CHA2DS2-VASc Score of 3.  Continue Apixaban (Eliquis).  Education on indication for anticoagulation and necessity for compliance was provided. No missed doses.  3. Snoring with concern for Obstructive sleep apnea At   this time it does not appear he has symptoms suggesting sleep apnea. Will defer testing at this time.  5. HTN He is nervous today and notes his BP is better at home. Advised to continue trend at home and goal is 130/80.    Follow up 1-2 weeks after DCCV.    Cheryal Salas, PA-C Afib Clinic [facility] [address] [place], [place] [address] [phone] 08/31/2022 11:00 AM  

## 2022-08-31 NOTE — Progress Notes (Signed)
Primary Care Physician: Jac Canavan, PA-C Primary Cardiologist: None Primary Electrophysiologist: None Referring Physician: Dr. Dorothe Pea is a 82 y.o. male with a history of HTN, prostate cancer, dyslipidemia, syncope, and atrial fibrillation who presents for consultation in the [facility]. Patient was admitted 4/3-4/24 for syncopal episode while having a bowel movement. It was thought episode was vasovagal and no indication for PPM. ECG showed rate controlled Afib. Echo showed normal EF 60-65%. He was discharged on Eliquis 5 mg BID and Toprol 50 mg daily. Patient is on Eliquis for a CHADS2VASC score of 3.  On evaluation today, he is currently in rate controlled Afib. He does not have cardiac awareness of his Afib. He has been taking Eliquis 5mg  BID since hospital discharge on 07/23/22. He does not feel more tired or short of breath.  He does not drink alcohol. He does not drink a lot of caffeinated beverages.  He is a widow so no one at home to know if he stops breathing. He doesn't think he snores and wakes up feeling well rested.  He is compliant with anticoagulation and has not missed any doses. He has no bleeding concerns.  Today, he denies symptoms of palpitations, chest pain, orthopnea, PND, lower extremity edema, dizziness, presyncope, syncope, snoring, daytime somnolence, bleeding, or neurologic sequela. The patient is tolerating medications without difficulties and is otherwise without complaint today.   Atrial Fibrillation Risk Factors:  he does not have symptoms or diagnosis of sleep apnea. he does not have a history of rheumatic fever. he does not have a history of alcohol use. The patient does not have a history of early familial atrial fibrillation or other arrhythmias.  he has a BMI of Body mass index is 26.14 kg/m.Marland Kitchen Filed Weights   08/31/22 0940  Weight: 80.3 kg    Family History  Problem Relation Age of Onset    Cancer Brother        stomach   Arthritis Brother    Arthritis Brother    Hypertension Brother    Hypertension Brother    Hypertension Mother    Cancer Father        lung, smoked 2 ppd   Cancer Paternal Uncle        cancer   Cancer Paternal Uncle        stomach     Atrial Fibrillation Management history:  Previous antiarrhythmic drugs: None Previous cardioversions: None Previous ablations: None Anticoagulation history: Eliquis   Past Medical History:  Diagnosis Date   Allergy    Anxiety    Chronic sinusitis    Diverticulosis    sigmoid colon and increased vascularization due to prior radiation, per 2013 colonoscopy   GERD (gastroesophageal reflux disease)    Hemorrhoid    Hyperlipidemia    Hypertension    Prostate cancer (HCC)    history of radiation therapy   Seasonal allergies    Urinary retention 2019   Past Surgical History:  Procedure Laterality Date   COLONOSCOPY  02/2012   sigmoid diverticulosis, polyps, Dr. Vida Rigger   CYSTOSCOPY WITH URETHRAL DILATATION N/A 02/10/2019   Procedure: CYSTOSCOPY WITH URETHRAL DILATATION,;  Surgeon: Sebastian Ache, MD;  Location: [facility];  Service: Urology;  Laterality: N/A;   PROSTATECTOMY     1990s    Current Outpatient Medications  Medication Sig Dispense Refill   apixaban (ELIQUIS) 5 MG TABS tablet Take 1 tablet (5 mg total) by mouth 2 (two) times  daily. 180 tablet 0   atorvastatin (LIPITOR) 10 MG tablet Take 1 tablet (10 mg total) by mouth daily. 90 tablet 3   Cholecalciferol (VITAMIN D3) 75 MCG (3000 UT) TABS Take 75 mcg by mouth daily.     famotidine (PEPCID) 40 MG tablet TAKE 1 TABLET BY MOUTH EVERY DAY 90 tablet 3   fluticasone (FLONASE) 50 MCG/ACT nasal spray SPRAY 2 SPRAYS INTO EACH NOSTRIL EVERY DAY (Patient taking differently: Place 2 sprays into both nostrils daily. SPRAY 2 SPRAYS INTO EACH NOSTRIL EVERY DAY) 48 mL 11   levocetirizine (XYZAL) 5 MG tablet TAKE 1 TABLET BY MOUTH EVERY DAY IN THE EVENING 90  tablet 3   lisinopril (ZESTRIL) 20 MG tablet Take 1 tablet (20 mg total) by mouth daily. 90 tablet 3   metoprolol succinate (TOPROL-XL) 50 MG 24 hr tablet Take 1 tablet (50 mg total) by mouth daily. Take with or immediately following a meal. 90 tablet 0   omeprazole (PRILOSEC) 40 MG capsule TAKE 1 CAPSULE BY MOUTH EVERY DAY (Patient taking differently: Take 40 mg by mouth daily. TAKE 1 CAPSULE BY MOUTH EVERY DAY) 90 capsule 3   polyethylene glycol powder (GLYCOLAX/MIRALAX) powder 1 capfull daily in beverage 3350 g 3   No current facility-administered medications for this encounter.    Allergies  Allergen Reactions   Pollen Extract-Tree Extract [Pollen Extract]     Social History   Socioeconomic History   Marital status: Married    Spouse name: Not on file   Number of children: Not on file   Years of education: Not on file   Highest education level: Not on file  Occupational History   Not on file  Tobacco Use   Smoking status: Never   Smokeless tobacco: Never  Vaping Use   Vaping Use: Never used  Substance and Sexual Activity   Alcohol use: No   Drug use: No   Sexual activity: Not on file  Other Topics Concern   Not on file  Social History Narrative   Lives alone. Widowed in 2009.   Handles own ADLs.   Watches [employer], always busy, yard work, garden work, works at [employer].    Goes to [employer].   6 brothers and 3 sisters.  Originally from [place], brought up in [place].  Father is [place], mother from [place]. Has family all over the place, [place], [place], [place], [place]   Social Determinants of Health   Financial Resource Strain: Low Risk  (10/10/2021)   Overall Financial Resource Strain (CARDIA)    Difficulty of Paying Living Expenses: Not hard at all  Food Insecurity: No Food Insecurity (07/27/2022)   Hunger Vital Sign    Worried About Running Out of Food in the Last Year: Never true    Ran Out of Food in the Last Year: Never true  Transportation Needs: No  Transportation Needs (07/27/2022)   PRAPARE - Administrator, Civil Service (Medical): No    Lack of Transportation (Non-Medical): No  Physical Activity: Inactive (10/10/2021)   Exercise Vital Sign    Days of Exercise per Week: 0 days    Minutes of Exercise per Session: 0 min  Stress: No Stress Concern Present (10/10/2021)   [employer] of Occupational Health - Occupational Stress Questionnaire    Feeling of Stress : Not at all  Social Connections: Not on file  Intimate Partner Violence: Not At Risk (07/22/2022)   Humiliation, Afraid, Rape, and Kick questionnaire  Fear of Current or Ex-Partner: No    Emotionally Abused: No    Physically Abused: No    Sexually Abused: No     ROS- All systems are reviewed and negative except as per the HPI above.  Physical Exam: Vitals:   08/31/22 0940  BP: (!) 164/96  Pulse: 60  Weight: 80.3 kg  Height: 5\' 9"  (1.753 m)    GEN- The patient is a well appearing male, alert and oriented x 3 today.   Head- normocephalic, atraumatic Eyes-  Sclera clear, conjunctiva pink Ears- hearing intact Oropharynx- clear Neck- supple  Lungs- Clear to ausculation bilaterally, normal work of breathing Heart- Irregular rate and rhythm, no murmurs, rubs or gallops  GI- soft, NT, ND, + BS Extremities- no clubbing, cyanosis, or edema MS- no significant deformity or atrophy Skin- no rash or lesion Psych- euthymic mood, full affect Neuro- strength and sensation are intact  Wt Readings from Last 3 Encounters:  08/31/22 80.3 kg  07/24/22 81.5 kg  07/23/22 80.7 kg    EKG today demonstrates  Vent. rate 60 BPM PR interval * ms QRS duration 96 ms QT/QTcB 424/424 ms P-R-T axes * 0 46 Atrial fibrillation with premature ventricular or aberrantly conducted complexes Incomplete right bundle branch block Abnormal ECG When compared with ECG of 22-Jul-2022 13:01, PREVIOUS ECG IS PRESENT  Echo 07/23/22 demonstrated:  1. Left ventricular ejection  fraction, by estimation, is 60 to 65%. The  left ventricle has normal function. The left ventricle has no regional  wall motion abnormalities. There is mild concentric left ventricular  hypertrophy. Left ventricular diastolic  parameters are indeterminate.   2. Right ventricular systolic function is normal. The right ventricular  size is normal. There is moderately elevated pulmonary artery systolic  pressure.   3. The mitral valve is normal in structure. Mild mitral valve  regurgitation. No evidence of mitral stenosis.   4. The aortic valve is tricuspid. There is mild calcification of the  aortic valve. Aortic valve regurgitation is mild. Aortic valve  sclerosis/calcification is present, without any evidence of aortic  stenosis. Aortic valve area, by VTI measures 2.27 cm.   Aortic valve mean gradient measures 5.0 mmHg. Aortic valve Vmax measures  1.52 m/s.   5. The inferior vena cava is dilated in size with >50% respiratory  variability, suggesting right atrial pressure of 8 mmHg.   Epic records are reviewed at length today.  CHA2DS2-VASc Score = 3  The patient's score is based upon: CHF History: 0 HTN History: 1 Diabetes History: 0 Stroke History: 0 Vascular Disease History: 0 Age Score: 2 Gender Score: 0       ASSESSMENT AND PLAN: Persistent Atrial Fibrillation (ICD10:  I48.19) The patient's CHA2DS2-VASc score is 3, indicating a 3.2% annual risk of stroke.    Education provided about Afib with visual diagram. Discussion about medication treatments and ablation in the future if indicated. After discussion, we will proceed with scheduling DCCV.   I will reach out to his ophthalmic surgeon Dr. Dairl Ponder at [facility] and confirm timing of DCCV vs timing of surgery is appropriate.  Will discuss method of home monitoring for rhythm at next OV.    2. Secondary Hypercoagulable State (ICD10:  D68.69) The patient is at significant risk for  stroke/thromboembolism based upon his CHA2DS2-VASc Score of 3.  Continue Apixaban (Eliquis).  Education on indication for anticoagulation and necessity for compliance was provided. No missed doses.  3. Snoring with concern for Obstructive sleep apnea At  this time it does not appear he has symptoms suggesting sleep apnea. Will defer testing at this time.  5. HTN He is nervous today and notes his BP is better at home. Advised to continue trend at home and goal is 130/80.    Follow up 1-2 weeks after DCCV.    Lake Bells, PA-C Afib Clinic [facility] [address] [place], [place] [address] [phone] 08/31/2022 11:00 AM

## 2022-08-31 NOTE — Telephone Encounter (Signed)
   Patient Name: Matthew Mckay  DOB: 25-Jul-1940 MRN: [id number]  Primary Cardiologist: None  Chart reviewed as part of pre-operative protocol coverage. Pre-op clearance already addressed by colleagues in earlier phone notes. To summarize recommendations:  -Pt seen by afib clinic today, scheduled for cardioversion on 09/02/22 and has follow up afib clinic visit on 5/23. Cannot hold anticoag for 3 weeks before and 4 weeks after cardioversion - procedure date will need to be moved back. Would wait to reassess at follow up visit in case further procedures are scheduled.   Will route this bundled recommendation to requesting provider via Epic fax function and remove from pre-op pool. Please call with questions.  Sharlene Dory, PA-C 08/31/2022, 1:36 PM

## 2022-08-31 NOTE — Patient Instructions (Signed)
Cardioversion scheduled for: Wednesday, May 15th   - Arrive at the [facility] and go to admitting at 730am   - Do not eat or drink anything after midnight the night prior to your procedure.   - Take all your morning medication (except diabetic medications) with a sip of water prior to arrival.  - You will not be able to drive home after your procedure.    - Do NOT miss any doses of your blood thinner - if you should miss a dose please notify our office immediately.   - If you feel as if you go back into normal rhythm prior to scheduled cardioversion, please notify our office immediately.   If your procedure is canceled in the cardioversion suite you will be charged a cancellation fee.

## 2022-09-01 NOTE — Anesthesia Preprocedure Evaluation (Signed)
Anesthesia Evaluation  Patient identified by MRN, date of birth, ID band Patient awake    Reviewed: Allergy & Precautions, NPO status , Patient's Chart, lab work & pertinent test results  Airway Mallampati: II  TM Distance: >3 FB Neck ROM: Full    Dental no notable dental hx.    Pulmonary    Pulmonary exam normal        Cardiovascular hypertension, Pt. on medications and Pt. on home beta blockers +CHF  + dysrhythmias Atrial Fibrillation  Rhythm:Irregular Rate:Normal  ECHO 04/24:  1. Left ventricular ejection fraction, by estimation, is 60 to 65%. The  left ventricle has normal function. The left ventricle has no regional  wall motion abnormalities. There is mild concentric left ventricular  hypertrophy. Left ventricular diastolic  parameters are indeterminate.   2. Right ventricular systolic function is normal. The right ventricular  size is normal. There is moderately elevated pulmonary artery systolic  pressure.   3. The mitral valve is normal in structure. Mild mitral valve  regurgitation. No evidence of mitral stenosis.   4. The aortic valve is tricuspid. There is mild calcification of the  aortic valve. Aortic valve regurgitation is mild. Aortic valve  sclerosis/calcification is present, without any evidence of aortic  stenosis. Aortic valve area, by VTI measures 2.27 cm.   Aortic valve mean gradient measures 5.0 mmHg. Aortic valve Vmax measures  1.52 m/s.   5. The inferior vena cava is dilated in size with >50% respiratory  variability, suggesting right atrial pressure of 8 mmHg.     Neuro/Psych    GI/Hepatic Neg liver ROS,GERD  ,,  Endo/Other  negative endocrine ROS    Renal/GU Renal disease     Musculoskeletal  (+) Arthritis ,    Abdominal Normal abdominal exam  (+)   Peds  Hematology negative hematology ROS (+)   Anesthesia Other Findings   Reproductive/Obstetrics                              Anesthesia Physical Anesthesia Plan  ASA: 3  Anesthesia Plan: General   Post-op Pain Management:    Induction: Intravenous  PONV Risk Score and Plan: 2 and Treatment may vary due to age or medical condition  Airway Management Planned: Mask  Additional Equipment: None  Intra-op Plan:   Post-operative Plan:   Informed Consent: I have reviewed the patients History and Physical, chart, labs and discussed the procedure including the risks, benefits and alternatives for the proposed anesthesia with the patient or authorized representative who has indicated his/her understanding and acceptance.     Dental advisory given  Plan Discussed with: CRNA  Anesthesia Plan Comments:        Anesthesia Quick Evaluation

## 2022-09-01 NOTE — Progress Notes (Signed)
Called pt and spoke to both him and his daughter Annice Pih. Instructed to arrive at the hospital at 0730 on 09/02/22.  Informed pt to be NPO after midnight on 09/02/22.  Informed that pt will need to have a designated person to drive them home and stay with them for 24 hours.  Instructed to take am meds with a small sip of water and to make sure to take eliquis in am before procedure.

## 2022-09-02 ENCOUNTER — Encounter (HOSPITAL_COMMUNITY)
Admission: RE | Disposition: A | Discharge status: Home / Self Care | Payer: Self-pay | Source: Home / Self Care | Attending: Internal Medicine

## 2022-09-02 ENCOUNTER — Encounter (HOSPITAL_COMMUNITY): Payer: Self-pay | Admitting: Internal Medicine

## 2022-09-02 ENCOUNTER — Other Ambulatory Visit: Payer: Self-pay

## 2022-09-02 ENCOUNTER — Ambulatory Visit (HOSPITAL_COMMUNITY)
Admission: RE | Admit: 2022-09-02 | Discharge: 2022-09-02 | Disposition: A | Discharge status: Home / Self Care | Payer: Medicare Other | Attending: Internal Medicine | Admitting: Internal Medicine

## 2022-09-02 ENCOUNTER — Ambulatory Visit (HOSPITAL_BASED_OUTPATIENT_CLINIC_OR_DEPARTMENT_OTHER): Payer: Medicare Other | Admitting: Anesthesiology

## 2022-09-02 ENCOUNTER — Ambulatory Visit (HOSPITAL_COMMUNITY): Payer: Medicare Other | Admitting: Anesthesiology

## 2022-09-02 DIAGNOSIS — I4819 Other persistent atrial fibrillation: Secondary | ICD-10-CM | POA: Insufficient documentation

## 2022-09-02 DIAGNOSIS — Z8546 Personal history of malignant neoplasm of prostate: Secondary | ICD-10-CM | POA: Insufficient documentation

## 2022-09-02 DIAGNOSIS — Z7901 Long term (current) use of anticoagulants: Secondary | ICD-10-CM | POA: Diagnosis not present

## 2022-09-02 DIAGNOSIS — I10 Essential (primary) hypertension: Secondary | ICD-10-CM | POA: Insufficient documentation

## 2022-09-02 DIAGNOSIS — I110 Hypertensive heart disease with heart failure: Secondary | ICD-10-CM

## 2022-09-02 DIAGNOSIS — R0683 Snoring: Secondary | ICD-10-CM | POA: Diagnosis not present

## 2022-09-02 DIAGNOSIS — I4891 Unspecified atrial fibrillation: Secondary | ICD-10-CM | POA: Diagnosis not present

## 2022-09-02 DIAGNOSIS — D6869 Other thrombophilia: Secondary | ICD-10-CM | POA: Diagnosis not present

## 2022-09-02 DIAGNOSIS — I509 Heart failure, unspecified: Secondary | ICD-10-CM

## 2022-09-02 DIAGNOSIS — E785 Hyperlipidemia, unspecified: Secondary | ICD-10-CM | POA: Insufficient documentation

## 2022-09-02 HISTORY — PX: CARDIOVERSION: SHX1299

## 2022-09-02 SURGERY — CARDIOVERSION
Anesthesia: General

## 2022-09-02 MED ORDER — SODIUM CHLORIDE 0.9 % IV SOLN: INTRAVENOUS | Status: DC

## 2022-09-02 MED ORDER — PROPOFOL 10 MG/ML IV BOLUS
INTRAVENOUS | Status: DC | PRN
  Administered 2022-09-02: 70 mg via INTRAVENOUS

## 2022-09-02 MED ORDER — SODIUM CHLORIDE 0.9 % IV SOLN: INTRAVENOUS | Status: DC | PRN

## 2022-09-02 MED ORDER — LIDOCAINE 2% (20 MG/ML) 5 ML SYRINGE
INTRAMUSCULAR | Status: DC | PRN
  Administered 2022-09-02: 60 mg via INTRAVENOUS

## 2022-09-02 SURGICAL SUPPLY — 1 items: ELECT DEFIB PAD ADLT CADENCE (PAD) ×1 IMPLANT

## 2022-09-02 NOTE — Interval H&P Note (Signed)
History and Physical Interval Note:  09/02/2022 8:14 AM  Matthew Mckay  has presented today for surgery, with the diagnosis of AFIB.  The various methods of treatment have been discussed with the patient and family. After consideration of risks, benefits and other options for treatment, the patient has consented to  Procedure(s): CARDIOVERSION (N/A) as a surgical intervention.  The patient's history has been reviewed, patient examined, no change in status, stable for surgery.  I have reviewed the patient's chart and labs.  Questions were answered to the patient's satisfaction.     Maisie Fus

## 2022-09-02 NOTE — Transfer of Care (Signed)
Immediate Anesthesia Transfer of Care Note  Patient: Matthew Mckay  Procedure(s) Performed: CARDIOVERSION  Patient Location: Cath Lab  Anesthesia Type:General  Level of Consciousness: sedated  Airway & Oxygen Therapy: Patient connected to nasal cannula oxygen  Post-op Assessment: Post -op Vital signs reviewed and stable  Post vital signs: stable  Last Vitals:  Vitals Value Taken Time  BP    Temp    Pulse    Resp    SpO2      Last Pain:  Vitals:   09/02/22 0729  TempSrc:   PainSc: 0-No pain         Complications: No notable events documented.

## 2022-09-02 NOTE — Progress Notes (Signed)
Per pt, Eliquis prescription is "recent", unsure of when it could have been started. [facility] contacted [phone]). Per pharmacist, first prescription was filled July 23, 2022. Pt confirmed no missed doses

## 2022-09-02 NOTE — CV Procedure (Signed)
Procedure: Electrical Cardioversion Indications:  Atrial Fibrillation  Procedure Details:  Consent: Risks of procedure as well as the alternatives and risks of each were explained to the (patient/caregiver).  Consent for procedure obtained.  Time Out: Verified patient identification, verified procedure, site/side was marked, verified correct patient position, special equipment/implants available, medications/allergies/relevent history reviewed, required imaging and test results available. PERFORMED.  Patient placed on cardiac monitor, pulse oximetry, supplemental oxygen as necessary.  Sedation given:  propofol Pacer pads placed anterior and posterior chest.  Cardioverted 1 time(s).  Cardioversion with synchronized biphasic 200J shock.  Evaluation: Findings: Post procedure EKG shows:  sinus rhythm, sinus bradycardia, PACs Complications: None Patient did tolerate procedure well.  Time Spent Directly with the Patient:  15 minutes   Maisie Fus 09/02/2022, 8:27 AM

## 2022-09-02 NOTE — Anesthesia Postprocedure Evaluation (Signed)
Anesthesia Post Note  Patient: Matthew Mckay  Procedure(s) Performed: CARDIOVERSION     Patient location during evaluation: PACU Anesthesia Type: General Level of consciousness: awake and alert Pain management: pain level controlled Vital Signs Assessment: post-procedure vital signs reviewed and stable Respiratory status: spontaneous breathing, nonlabored ventilation, respiratory function stable and patient connected to nasal cannula oxygen Cardiovascular status: blood pressure returned to baseline and stable Postop Assessment: no apparent nausea or vomiting Anesthetic complications: no   No notable events documented.  Last Vitals:  Vitals:   09/02/22 0850 09/02/22 0854  BP: (!) 145/78   Pulse: 60 (!) 55  Resp: 14 16  Temp:    SpO2: 97% 95%    Last Pain:  Vitals:   09/02/22 0854  TempSrc:   PainSc: 0-No pain                 Nelle Don Wlliam Grosso

## 2022-09-03 ENCOUNTER — Encounter (HOSPITAL_COMMUNITY): Payer: Self-pay | Admitting: Internal Medicine

## 2022-09-10 ENCOUNTER — Ambulatory Visit (HOSPITAL_COMMUNITY): Payer: Medicare Other | Admitting: Internal Medicine

## 2022-09-15 ENCOUNTER — Encounter (HOSPITAL_COMMUNITY): Payer: Self-pay | Admitting: Internal Medicine

## 2022-09-15 ENCOUNTER — Ambulatory Visit (HOSPITAL_COMMUNITY)
Admission: RE | Admit: 2022-09-15 | Discharge: 2022-09-15 | Disposition: A | Discharge status: Home / Self Care | Payer: Medicare Other | Source: Ambulatory Visit | Attending: Internal Medicine | Admitting: Internal Medicine

## 2022-09-15 VITALS — BP 122/62 | HR 54 | Ht 69.0 in | Wt 172.6 lb

## 2022-09-15 DIAGNOSIS — Z8546 Personal history of malignant neoplasm of prostate: Secondary | ICD-10-CM | POA: Insufficient documentation

## 2022-09-15 DIAGNOSIS — Z7901 Long term (current) use of anticoagulants: Secondary | ICD-10-CM | POA: Diagnosis not present

## 2022-09-15 DIAGNOSIS — I4819 Other persistent atrial fibrillation: Secondary | ICD-10-CM | POA: Diagnosis not present

## 2022-09-15 DIAGNOSIS — D6869 Other thrombophilia: Secondary | ICD-10-CM

## 2022-09-15 DIAGNOSIS — R0683 Snoring: Secondary | ICD-10-CM | POA: Insufficient documentation

## 2022-09-15 DIAGNOSIS — Z79899 Other long term (current) drug therapy: Secondary | ICD-10-CM | POA: Diagnosis not present

## 2022-09-15 DIAGNOSIS — Z8249 Family history of ischemic heart disease and other diseases of the circulatory system: Secondary | ICD-10-CM | POA: Insufficient documentation

## 2022-09-15 DIAGNOSIS — I10 Essential (primary) hypertension: Secondary | ICD-10-CM | POA: Diagnosis not present

## 2022-09-15 MED ORDER — METOPROLOL SUCCINATE ER 25 MG PO TB24: 25.0000 mg | ORAL_TABLET | Freq: Every day | ORAL | 2 refills | Status: DC

## 2022-09-15 NOTE — Patient Instructions (Signed)
Decrease metoprolol to 25 mg once a day.

## 2022-09-15 NOTE — Progress Notes (Signed)
Primary Care Physician: Jac Canavan, PA-C Primary Cardiologist: None Primary Electrophysiologist: None Referring Physician: Dr. Dorothe Pea is a 82 y.o. male with a history of HTN, prostate cancer, dyslipidemia, syncope, and atrial fibrillation who presents for consultation in the [facility]. Patient was admitted 4/3-4/24 for syncopal episode while having a bowel movement. It was thought episode was vasovagal and no indication for PPM. ECG showed rate controlled Afib. Echo showed normal EF 60-65%. He was discharged on Eliquis 5 mg BID and Toprol 50 mg daily. Patient is on Eliquis for a CHADS2VASC score of 3.  On evaluation 08/31/22, he is currently in rate controlled Afib. He does not have cardiac awareness of his Afib. He has been taking Eliquis 5mg  BID since hospital discharge on 07/23/22. He does not feel more tired or short of breath.  He does not drink alcohol. He does not drink a lot of caffeinated beverages.  He is a widow so no one at home to know if he stops breathing. He doesn't think he snores and wakes up feeling well rested.  He is compliant with anticoagulation and has not missed any doses. He has no bleeding concerns.  On follow up 09/15/22, he is currently in NSR. He is s/p DCCV on 09/02/22 with successful conversion to NSR. He notes to feel well overall, specifically says he feels the same now as he did prior to ablation. Some intermittent dizziness since ablation. He does not have a rhythm monitoring device at home. He has not missed any doses of anticoagulation.  Today, he denies symptoms of palpitations, chest pain, orthopnea, PND, lower extremity edema, dizziness, presyncope, syncope, snoring, daytime somnolence, bleeding, or neurologic sequela. The patient is tolerating medications without difficulties and is otherwise without complaint today.   Atrial Fibrillation Risk Factors:  he does not have symptoms or diagnosis of  sleep apnea. he does not have a history of rheumatic fever. he does not have a history of alcohol use. The patient does not have a history of early familial atrial fibrillation or other arrhythmias.  he has a BMI of Body mass index is 25.49 kg/m.Marland Kitchen Filed Weights   09/15/22 0925  Weight: 78.3 kg     Family History  Problem Relation Age of Onset   Cancer Brother        stomach   Arthritis Brother    Arthritis Brother    Hypertension Brother    Hypertension Brother    Hypertension Mother    Cancer Father        lung, smoked 2 ppd   Cancer Paternal Uncle        cancer   Cancer Paternal Uncle        stomach    Atrial Fibrillation Management history:  Previous antiarrhythmic drugs: None Previous cardioversions: 09/02/22 Previous ablations: None Anticoagulation history: Eliquis   Past Medical History:  Diagnosis Date   Allergy    Anxiety    Chronic sinusitis    Diverticulosis    sigmoid colon and increased vascularization due to prior radiation, per 2013 colonoscopy   GERD (gastroesophageal reflux disease)    Hemorrhoid    Hyperlipidemia    Hypertension    Prostate cancer (HCC)    history of radiation therapy   Seasonal allergies    Urinary retention 2019   Past Surgical History:  Procedure Laterality Date   CARDIOVERSION N/A 09/02/2022   Procedure: CARDIOVERSION;  Surgeon: Maisie Fus, MD;  Location: [facility]  INVASIVE CV LAB;  Service: Cardiovascular;  Laterality: N/A;   COLONOSCOPY  02/2012   sigmoid diverticulosis, polyps, Dr. Vida Rigger   CYSTOSCOPY WITH URETHRAL DILATATION N/A 02/10/2019   Procedure: CYSTOSCOPY WITH URETHRAL DILATATION,;  Surgeon: Sebastian Ache, MD;  Location: [facility] ORS;  Service: Urology;  Laterality: N/A;   PROSTATECTOMY     1990s    Current Outpatient Medications  Medication Sig Dispense Refill   apixaban (ELIQUIS) 5 MG TABS tablet Take 1 tablet (5 mg total) by mouth 2 (two) times daily. 180 tablet 0   atorvastatin (LIPITOR) 10 MG tablet  Take 1 tablet (10 mg total) by mouth daily. 90 tablet 3   Cholecalciferol (VITAMIN D3) 75 MCG (3000 UT) TABS Take 75 mcg by mouth daily.     famotidine (PEPCID) 40 MG tablet TAKE 1 TABLET BY MOUTH EVERY DAY 90 tablet 3   fluticasone (FLONASE) 50 MCG/ACT nasal spray SPRAY 2 SPRAYS INTO EACH NOSTRIL EVERY DAY (Patient taking differently: Place 2 sprays into both nostrils daily. SPRAY 2 SPRAYS INTO EACH NOSTRIL EVERY DAY) 48 mL 11   levocetirizine (XYZAL) 5 MG tablet TAKE 1 TABLET BY MOUTH EVERY DAY IN THE EVENING 90 tablet 3   lisinopril (ZESTRIL) 20 MG tablet Take 1 tablet (20 mg total) by mouth daily. 90 tablet 3   omeprazole (PRILOSEC) 40 MG capsule TAKE 1 CAPSULE BY MOUTH EVERY DAY (Patient taking differently: Take 40 mg by mouth daily. TAKE 1 CAPSULE BY MOUTH EVERY DAY) 90 capsule 3   polyethylene glycol powder (GLYCOLAX/MIRALAX) powder 1 capfull daily in beverage 3350 g 3   Salicylic Acid, Acne, (SALICYLIC ACID EX) Apply 1 Application topically at bedtime. 50% Ointment     metoprolol succinate (TOPROL-XL) 25 MG 24 hr tablet Take 1 tablet (25 mg total) by mouth daily. Take with or immediately following a meal. 90 tablet 2   No current facility-administered medications for this encounter.    Allergies  Allergen Reactions   Pollen Extract-Tree Extract [Pollen Extract]     Social History   Socioeconomic History   Marital status: Married    Spouse name: Not on file   Number of children: Not on file   Years of education: Not on file   Highest education level: Not on file  Occupational History   Not on file  Tobacco Use   Smoking status: Never   Smokeless tobacco: Never  Vaping Use   Vaping Use: Never used  Substance and Sexual Activity   Alcohol use: No   Drug use: No   Sexual activity: Not on file  Other Topics Concern   Not on file  Social History Narrative   Lives alone. Widowed in 2009.   Handles own ADLs.   Watches [employer], always busy, yard work, garden work, works at  [employer].    Goes to [place].   6 brothers and 3 sisters.  Originally from [place], brought up in [place].  Father is [place], mother from [place]. Has family all over the place, [place], [place], [place], [place]   Social Determinants of Health   Financial Resource Strain: Low Risk  (10/10/2021)   Overall Financial Resource Strain (CARDIA)    Difficulty of Paying Living Expenses: Not hard at all  Food Insecurity: No Food Insecurity (07/27/2022)   Hunger Vital Sign    Worried About Running Out of Food in the Last Year: Never true    Ran Out of Food in the Last Year: Never true  Transportation Needs:  No Transportation Needs (07/27/2022)   PRAPARE - Administrator, Civil Service (Medical): No    Lack of Transportation (Non-Medical): No  Physical Activity: Inactive (10/10/2021)   Exercise Vital Sign    Days of Exercise per Week: 0 days    Minutes of Exercise per Session: 0 min  Stress: No Stress Concern Present (10/10/2021)   [employer] of Occupational Health - Occupational Stress Questionnaire    Feeling of Stress : Not at all  Social Connections: Not on file  Intimate Partner Violence: Not At Risk (07/22/2022)   Humiliation, Afraid, Rape, and Kick questionnaire    Fear of Current or Ex-Partner: No    Emotionally Abused: No    Physically Abused: No    Sexually Abused: No     ROS- All systems are reviewed and negative except as per the HPI above.  Physical Exam: Vitals:   09/15/22 0925  BP: 122/62  Pulse: (!) 54  Weight: 78.3 kg  Height: 5\' 9"  (1.753 m)     GEN- The patient is well appearing, alert and oriented x 3 today.   Head- normocephalic, atraumatic Eyes-  Sclera clear, conjunctiva pink Ears- hearing intact Lungs- Clear to ausculation bilaterally, normal work of breathing Heart- Regular bradycardic rate and rhythm, no murmurs, rubs or gallops, PMI not laterally displaced Extremities- no clubbing, cyanosis, or edema MS- no significant deformity or  atrophy Skin- no rash or lesion Psych- euthymic mood, full affect Neuro- strength and sensation are intact   Wt Readings from Last 3 Encounters:  09/15/22 78.3 kg  09/02/22 80.3 kg  08/31/22 80.3 kg    EKG today demonstrates  Vent. rate 54 BPM PR interval 178 ms QRS duration 98 ms QT/QTcB 450/426 ms P-R-T axes 64 7 54 Sinus bradycardia RSR' or QR pattern in V1 suggests right ventricular conduction delay Borderline ECG When compared with ECG of 02-Sep-2022 08:41, PREVIOUS ECG IS PRESENT No significant change since last tracing Confirmed by Olga Millers ([id number]) on 09/15/2022 10:13:47 AM  Echo 07/23/22 demonstrated:  1. Left ventricular ejection fraction, by estimation, is 60 to 65%. The  left ventricle has normal function. The left ventricle has no regional  wall motion abnormalities. There is mild concentric left ventricular  hypertrophy. Left ventricular diastolic  parameters are indeterminate.   2. Right ventricular systolic function is normal. The right ventricular  size is normal. There is moderately elevated pulmonary artery systolic  pressure.   3. The mitral valve is normal in structure. Mild mitral valve  regurgitation. No evidence of mitral stenosis.   4. The aortic valve is tricuspid. There is mild calcification of the  aortic valve. Aortic valve regurgitation is mild. Aortic valve  sclerosis/calcification is present, without any evidence of aortic  stenosis. Aortic valve area, by VTI measures 2.27 cm.   Aortic valve mean gradient measures 5.0 mmHg. Aortic valve Vmax measures  1.52 m/s.   5. The inferior vena cava is dilated in size with >50% respiratory  variability, suggesting right atrial pressure of 8 mmHg.   Epic records are reviewed at length today.  CHA2DS2-VASc Score = 3  The patient's score is based upon: CHF History: 0 HTN History: 1 Diabetes History: 0 Stroke History: 0 Vascular Disease History: 0 Age Score: 2 Gender Score: 0        ASSESSMENT AND PLAN: Persistent Atrial Fibrillation (ICD10:  I48.19) The patient's CHA2DS2-VASc score is 3, indicating a 3.2% annual risk of stroke.    He is in NSR today.  S/p DCCV on 09/02/22.   We discussed AAD options vs ablation (will be 82 in July) going forward. Considering he has no cardiac awareness of Afib and feels well/same as prior to cardioversion, also discussed in the future to consider rate control. Due to dizziness, will lower dose metoprolol to 25 mg daily.  Kardiamobile device recommended to patient's son. Patient lives at home by himself.    2. Secondary Hypercoagulable State (ICD10:  D68.69) The patient is at significant risk for stroke/thromboembolism based upon his CHA2DS2-VASc Score of 3.  Continue Apixaban (Eliquis).  Education on indication for anticoagulation and necessity for compliance was provided. No missed doses.  3. Snoring with concern for Obstructive sleep apnea At this time it does not appear he has symptoms suggesting sleep apnea. Will defer testing at this time.  5. HTN He is nervous today and notes his BP is better at home. Advised to continue trend at home and goal is 130/80.    Follow up 3 months.    Lake Bells, PA-C Afib Clinic [facility] [address] [place], [place] [address] [phone] 09/15/2022 1:38 PM

## 2022-09-20 ENCOUNTER — Other Ambulatory Visit: Payer: Self-pay | Admitting: Medical

## 2022-09-28 ENCOUNTER — Ambulatory Visit (INDEPENDENT_AMBULATORY_CARE_PROVIDER_SITE_OTHER): Payer: Medicare Other | Admitting: Medical

## 2022-09-28 VITALS — BP 122/80 | HR 59 | Temp 97.0°F | Wt 171.2 lb

## 2022-09-28 DIAGNOSIS — Q6102 Congenital multiple renal cysts: Secondary | ICD-10-CM | POA: Diagnosis not present

## 2022-09-28 DIAGNOSIS — N35919 Unspecified urethral stricture, male, unspecified site: Secondary | ICD-10-CM | POA: Diagnosis not present

## 2022-09-28 DIAGNOSIS — Z9079 Acquired absence of other genital organ(s): Secondary | ICD-10-CM | POA: Diagnosis not present

## 2022-09-28 DIAGNOSIS — R809 Proteinuria, unspecified: Secondary | ICD-10-CM

## 2022-09-28 DIAGNOSIS — R3129 Other microscopic hematuria: Secondary | ICD-10-CM

## 2022-09-28 DIAGNOSIS — R319 Hematuria, unspecified: Secondary | ICD-10-CM | POA: Diagnosis not present

## 2022-09-28 LAB — POCT URINALYSIS DIP (PROADVANTAGE DEVICE)
Bilirubin, UA: NEGATIVE
Glucose, UA: NEGATIVE mg/dL
Ketones, POC UA: NEGATIVE mg/dL
Leukocytes, UA: NEGATIVE
Nitrite, UA: NEGATIVE
Protein Ur, POC: 30 mg/dL — AB
Specific Gravity, Urine: 1.015
Urobilinogen, Ur: NEGATIVE
pH, UA: 6.5 (ref 5.0–8.0)

## 2022-09-28 LAB — BASIC METABOLIC PANEL
Calcium: 9.3 mg/dL (ref 8.6–10.2)
Chloride: 101 mmol/L (ref 96–106)
Glucose: 141 mg/dL — ABNORMAL HIGH (ref 70–99)

## 2022-09-28 LAB — MICROALBUMIN / CREATININE URINE RATIO

## 2022-09-28 NOTE — Progress Notes (Signed)
Subjective:  Matthew Mckay is a 82 y.o. male who presents for Chief Complaint  Patient presents with   Hematuria    Blood in urine over a week ago. Last 2 days have been clear     Here by himself this morning for c/o blood in urine.  This is a recurring problem.  Last week he had about 3-4 days of blood in urine.  Today and yesterday urine was clear.  Been drinking a good amount of water.  No belly or back pain last week.  No body aches, chills, nauseas, vomiting, diarrhea.  Felt maybe feverish last week.  The blood worried him.  No symptoms today.  No other aggravating or relieving factors.    No other c/o.  Past Medical History:  Diagnosis Date   Allergy    Anxiety    Chronic sinusitis    Diverticulosis    sigmoid colon and increased vascularization due to prior radiation, per 2013 colonoscopy   GERD (gastroesophageal reflux disease)    Hemorrhoid    Hyperlipidemia    Hypertension    Prostate cancer (HCC)    history of radiation therapy   Seasonal allergies    Urinary retention 2019   Past Surgical History:  Procedure Laterality Date   CARDIOVERSION N/A 09/02/2022   Procedure: CARDIOVERSION;  Surgeon: Maisie Fus, MD;  Location: [facility] INVASIVE CV LAB;  Service: Cardiovascular;  Laterality: N/A;   COLONOSCOPY  02/2012   sigmoid diverticulosis, polyps, Dr. Vida Rigger   CYSTOSCOPY WITH URETHRAL DILATATION N/A 02/10/2019   Procedure: CYSTOSCOPY WITH URETHRAL DILATATION,;  Surgeon: Sebastian Ache, MD;  Location: [facility];  Service: Urology;  Laterality: N/A;   PROSTATECTOMY     1990s     The following portions of the patient's history were reviewed and updated as appropriate: allergies, current medications, past family history, past medical history, past social history, past surgical history and problem list.  ROS Otherwise as in subjective above    Objective: BP 122/80   Pulse (!) 59   Temp (!) 97 F (36.1 C)   Wt 171 lb 3.2 oz (77.7 kg)   BMI 25.28 kg/m    General appearance: alert, no distress, well developed, well nourished Abdomen: +bs, soft, non tender, non distended, no masses, no hepatomegaly, no splenomegaly Back: nontender Pulses: 2+ radial pulses, 2+ pedal pulses, normal cap refill Ext: no edema GU: uncircumcised, nontender of testes, spermatic cord or GU in general, wearing adult diaper, and he has had a little urinary incontinence with urine soiling pants a little today    Assessment: Encounter Diagnoses  Name Primary?   Hematuria, unspecified type Yes   Other microscopic hematuria    Proteinuria, unspecified type    History of prostatectomy    Multiple renal cysts    Stricture of male urethra, unspecified stricture type      Plan: We discussed his frank hematuria with subjective fever last week.  He has a history of prostatectomy years ago.  He has not seen urology in a few years.  He also has a history of bilateral renal cyst and protein in the urine today.  Additional labs as below  Of note, he has hx/o multiple renal cysts, simple appearing per 04/2021 CT abdomen pelvis.  Referral to urology  Matthew Mckay was seen today for hematuria.  Diagnoses and all orders for this visit:  Hematuria, unspecified type -     Microalbumin/Creatinine Ratio, Urine -     Basic metabolic panel -  PSA -     Urine Culture  Other microscopic hematuria -     POCT Urinalysis DIP (Proadvantage Device)  Proteinuria, unspecified type  History of prostatectomy -     PSA  Multiple renal cysts -     Basic metabolic panel  Stricture of male urethra, unspecified stricture type    Follow up: pending labs, urology consult

## 2022-09-29 LAB — MICROALBUMIN / CREATININE URINE RATIO: Microalbumin, Urine: 195.9 ug/mL

## 2022-09-29 LAB — BASIC METABOLIC PANEL
BUN/Creatinine Ratio: 23 (ref 10–24)
BUN: 17 mg/dL (ref 8–27)
CO2: 22 mmol/L (ref 20–29)
Creatinine, Ser: 0.73 mg/dL — ABNORMAL LOW (ref 0.76–1.27)
Potassium: 3.9 mmol/L (ref 3.5–5.2)
eGFR: 90 mL/min/{1.73_m2} (ref >59)

## 2022-09-29 LAB — URINE CULTURE

## 2022-09-29 LAB — PSA: Prostate Specific Ag, Serum: 2.5 ng/mL (ref 0.0–4.0)

## 2022-09-29 NOTE — Progress Notes (Signed)
His blood sugar was elevated.  I am assuming he was not fasting.  Please verify.  Microalbumin kidney marker was elevated suggesting some early signs of damage to the kidney presumably due to high blood pressure.  PSA marker is increased higher than last time.  Please make sure we called either yesterday or today to get him back in with Dr. Berneice Heinrich, [facility] ASAP due to gross blood in the urine, elevated PSA marker compared to last blood test, history of prostate cancer  Pending urine culture to determine whether or not there is an infection  Regarding the microalbumin elevated kidney marker, lets recheck office visit in 2 months.

## 2022-09-30 ENCOUNTER — Telehealth: Payer: Self-pay | Admitting: Medical

## 2022-09-30 ENCOUNTER — Other Ambulatory Visit: Payer: Self-pay | Admitting: Medical

## 2022-09-30 DIAGNOSIS — E569 Vitamin deficiency, unspecified: Secondary | ICD-10-CM

## 2022-09-30 DIAGNOSIS — R7301 Impaired fasting glucose: Secondary | ICD-10-CM

## 2022-09-30 NOTE — Telephone Encounter (Signed)
LVM with pt's son regarding appt with [facility] and that in the future they can call them directly since patient is established.

## 2022-09-30 NOTE — Progress Notes (Signed)
Urine culture shows no infection.  Confirm we have contacted urology about getting him with Dr. Berneice Heinrich ASAP correct?

## 2022-09-30 NOTE — Telephone Encounter (Signed)
Johns son Cleone Slim asks if Vincenza Hews could give him a call he has questions and he really wants to speak with Vincenza Hews about his dad. He does not want a message to be sent back.

## 2022-11-06 DIAGNOSIS — H0101B Ulcerative blepharitis left eye, upper and lower eyelids: Secondary | ICD-10-CM | POA: Diagnosis not present

## 2022-11-06 DIAGNOSIS — H2513 Age-related nuclear cataract, bilateral: Secondary | ICD-10-CM | POA: Diagnosis not present

## 2022-11-06 DIAGNOSIS — H02112 Cicatricial ectropion of right lower eyelid: Secondary | ICD-10-CM | POA: Diagnosis not present

## 2022-11-06 DIAGNOSIS — H0101A Ulcerative blepharitis right eye, upper and lower eyelids: Secondary | ICD-10-CM | POA: Diagnosis not present

## 2022-11-06 DIAGNOSIS — H5203 Hypermetropia, bilateral: Secondary | ICD-10-CM | POA: Diagnosis not present

## 2022-11-06 DIAGNOSIS — H524 Presbyopia: Secondary | ICD-10-CM | POA: Diagnosis not present

## 2022-11-07 ENCOUNTER — Other Ambulatory Visit: Payer: Self-pay | Admitting: Medical

## 2022-11-09 NOTE — Telephone Encounter (Signed)
Looks like pt was discontinued off this

## 2022-11-20 ENCOUNTER — Other Ambulatory Visit: Payer: Self-pay | Admitting: Medical

## 2022-11-28 ENCOUNTER — Other Ambulatory Visit: Payer: Self-pay | Admitting: Medical

## 2022-11-30 ENCOUNTER — Ambulatory Visit (INDEPENDENT_AMBULATORY_CARE_PROVIDER_SITE_OTHER): Payer: Medicare Other | Admitting: Medical

## 2022-11-30 VITALS — BP 110/64 | HR 63 | Wt 171.0 lb

## 2022-11-30 DIAGNOSIS — R195 Other fecal abnormalities: Secondary | ICD-10-CM | POA: Diagnosis not present

## 2022-11-30 DIAGNOSIS — H1013 Acute atopic conjunctivitis, bilateral: Secondary | ICD-10-CM

## 2022-11-30 DIAGNOSIS — R143 Flatulence: Secondary | ICD-10-CM | POA: Diagnosis not present

## 2022-11-30 DIAGNOSIS — I4891 Unspecified atrial fibrillation: Secondary | ICD-10-CM

## 2022-11-30 DIAGNOSIS — K862 Cyst of pancreas: Secondary | ICD-10-CM

## 2022-11-30 DIAGNOSIS — E569 Vitamin deficiency, unspecified: Secondary | ICD-10-CM

## 2022-11-30 DIAGNOSIS — R7301 Impaired fasting glucose: Secondary | ICD-10-CM | POA: Diagnosis not present

## 2022-11-30 DIAGNOSIS — I10 Essential (primary) hypertension: Secondary | ICD-10-CM

## 2022-11-30 DIAGNOSIS — R413 Other amnesia: Secondary | ICD-10-CM

## 2022-11-30 DIAGNOSIS — R142 Eructation: Secondary | ICD-10-CM

## 2022-11-30 DIAGNOSIS — R109 Unspecified abdominal pain: Secondary | ICD-10-CM | POA: Diagnosis not present

## 2022-11-30 DIAGNOSIS — N3943 Post-void dribbling: Secondary | ICD-10-CM

## 2022-11-30 DIAGNOSIS — N401 Enlarged prostate with lower urinary tract symptoms: Secondary | ICD-10-CM

## 2022-11-30 DIAGNOSIS — R251 Tremor, unspecified: Secondary | ICD-10-CM

## 2022-11-30 DIAGNOSIS — K219 Gastro-esophageal reflux disease without esophagitis: Secondary | ICD-10-CM | POA: Diagnosis not present

## 2022-11-30 DIAGNOSIS — R935 Abnormal findings on diagnostic imaging of other abdominal regions, including retroperitoneum: Secondary | ICD-10-CM | POA: Diagnosis not present

## 2022-11-30 DIAGNOSIS — J3089 Other allergic rhinitis: Secondary | ICD-10-CM

## 2022-11-30 LAB — POCT URINALYSIS DIP (PROADVANTAGE DEVICE)
Bilirubin, UA: NEGATIVE
Blood, UA: NEGATIVE
Glucose, UA: NEGATIVE mg/dL
Ketones, POC UA: NEGATIVE mg/dL
Leukocytes, UA: NEGATIVE
Nitrite, UA: NEGATIVE
Protein Ur, POC: NEGATIVE mg/dL
Specific Gravity, Urine: 1.02
Urobilinogen, Ur: NEGATIVE
pH, UA: 6 (ref 5.0–8.0)

## 2022-11-30 MED ORDER — LISINOPRIL 20 MG PO TABS: 20.0000 mg | ORAL_TABLET | Freq: Every day | ORAL | 3 refills | Status: DC

## 2022-11-30 MED ORDER — LEVOCETIRIZINE DIHYDROCHLORIDE 5 MG PO TABS
ORAL_TABLET | ORAL | 3 refills | Status: DC
Start: 2022-11-30 — End: 2023-11-22

## 2022-11-30 MED ORDER — IBGARD 90 MG PO CPCR: 1.0000 | ORAL_CAPSULE | Freq: Every day | ORAL | 2 refills | Status: AC

## 2022-11-30 MED ORDER — FLUTICASONE PROPIONATE 50 MCG/ACT NA SUSP: 2.0000 | Freq: Every day | NASAL | 11 refills | Status: DC

## 2022-11-30 NOTE — Progress Notes (Signed)
Subjective:  Matthew Mckay is a 82 y.o. male who presents for Chief Complaint  Patient presents with   Medical Management of Chronic Issues    Follow-up on Kidney marker   Medical team: Dr. Carolan Clines, [place], [place], cardiology/Afib clinic Dr. Blair Promise, ophthalmology Dr. Linna Hoff, podiatry Dr. Olive Bass and Dr. Dairl Ponder, dermatology Dr. Serena Colonel, ENT Dr. Sebastian Ache, urology   Here for follow-up on chronic issues.  He runs out of Eliquis today.  Needs a refill.  He has a follow-up appointment with A-fib clinic later this month.  No recent shortness of breath or chest pain.  No edema.  He has chronic urinary incontinence and bladder issues.  Wears adult diapers daily.  No blood in the urine.  His only complaint today is lately has had lots of burping and gas intermittent pain in the left lower belly of the left abdomen.  He points to the left side of his abdomen somewhat lower.  No blood in the stool of late.  He does get some loose stool sometimes.  Sometimes stool is formed, sometimes loose.  Typically has a daily bowel movement.  He does deal with hemorrhoids periodically.  Compliant with medications in general.  Daughter Annice Pih sees him several times per week, lives next door to him.  She brings him meals a few times per week.   No other aggravating or relieving factors.    No other c/o.  Past Medical History:  Diagnosis Date   Allergy    Anxiety    Chronic sinusitis    Diverticulosis    sigmoid colon and increased vascularization due to prior radiation, per 2013 colonoscopy   GERD (gastroesophageal reflux disease)    Hemorrhoid    Hyperlipidemia    Hypertension    Prostate cancer (HCC)    history of radiation therapy   Seasonal allergies    Urinary retention 2019   Current Outpatient Medications on File Prior to Visit  Medication Sig Dispense Refill   atorvastatin (LIPITOR) 10 MG tablet Take 1 tablet (10 mg total) by mouth  daily. 90 tablet 3   Cholecalciferol (VITAMIN D3) 75 MCG (3000 UT) TABS Take 75 mcg by mouth daily.     famotidine (PEPCID) 40 MG tablet TAKE 1 TABLET BY MOUTH EVERY DAY 90 tablet 1   metoprolol succinate (TOPROL-XL) 25 MG 24 hr tablet Take 1 tablet (25 mg total) by mouth daily. Take with or immediately following a meal. 90 tablet 2   Salicylic Acid, Acne, (SALICYLIC ACID EX) Apply 1 Application topically at bedtime. 50% Ointment     ELIQUIS 5 MG TABS tablet TAKE 1 TABLET BY MOUTH TWICE A DAY 180 tablet 3   No current facility-administered medications on file prior to visit.     The following portions of the patient's history were reviewed and updated as appropriate: allergies, current medications, past family history, past medical history, past social history, past surgical history and problem list.  ROS Otherwise as in subjective above  Objective: BP 110/64   Pulse 63   Wt 171 lb (77.6 kg)   BMI 25.25 kg/m   General appearance: alert, no distress, well developed, well nourished Neck: supple, no lymphadenopathy, no thyromegaly, no masses Heart: 2/6 murmur brief holosystolic in upper sternal borders, somewhat irregular at times, otherwise normal S1, S2,  Lungs: CTA bilaterally, no wheezes, rhonchi, or rales Abdomen: +bs, soft, non tender to palpation, no rebound, non distended, no masses, no hepatomegaly, no splenomegaly Pulses:  2+ radial pulses, 2+ pedal pulses, normal cap refill Ext: no edema Resting tremor of the head and neck noted, somewhat of a slight shuffle gait, otherwise CN II through XII intact, nonfocal exam, x 3   Assessment: Encounter Diagnoses  Name Primary?   Abdominal discomfort Yes   Pancreatic cyst    Abnormal CT of the abdomen    Loose stools    Flatulence    Belching    Vitamin deficiency    Impaired fasting blood sugar    Atrial fibrillation, unspecified type (HCC)    Benign prostatic hyperplasia with post-void dribbling    Essential hypertension     Gastroesophageal reflux disease, unspecified whether esophagitis present    Allergic conjunctivitis, bilateral    Other allergic rhinitis    Memory change    Tremor      Plan: Abdominal discomfort He may just be having some indigestion and gas related to the recent symptoms.  Consider trial of Probiotic such Align or IB guard daily over the counter daily You are due for repeat CT abdomen pelvis given findings a year ago regarding pancreas and some diverticular findings.  Order placed for updated CT scan.    History of pancreatic cyst on scan last year.  Plan repeat CT abdomen pelvis.  Expect phone call about scheduling.  History of impaired fasting glucose-updated diabetes screening today.  Continue with healthy eating habits.  Avoid lots of sweets, junk food and processed foods.    Updated labs today as below  Atrial fibrillation  Follow up later this month as planned with cardiology.   Continue Eliquis twice daily anticoagulant and daily Toprol/Metoprolol  High blood pressure Continue Lisinopril daily 20mg    High cholesterol Continue Lipitor 10mg  daily in the evening  Allergies  I refilled your Xyzal oral tablet and nasal spray for allergy symptoms  Memory concerns, resting tremor Offered referral to neurology.   Referral placed last 05/2021, but I don't think he ever went.   Let me know if agreeable to referral    [name] was seen today for medical management of chronic issues.  Diagnoses and all orders for this visit:  Abdominal discomfort -     POCT Urinalysis DIP (Proadvantage Device) -     CT ABDOMEN PELVIS W CONTRAST; Future  Pancreatic cyst -     CT ABDOMEN PELVIS W CONTRAST; Future  Abnormal CT of the abdomen -     CT ABDOMEN PELVIS W CONTRAST; Future  Loose stools -     CT ABDOMEN PELVIS W CONTRAST; Future  Flatulence  Belching  Vitamin deficiency -     Iron, TIBC and Ferritin Panel -     Basic metabolic panel  Impaired fasting blood sugar -      Basic metabolic panel -     Hemoglobin A1c  Atrial fibrillation, unspecified type (HCC)  Benign prostatic hyperplasia with post-void dribbling  Essential hypertension  Gastroesophageal reflux disease, unspecified whether esophagitis present  Allergic conjunctivitis, bilateral -     levocetirizine (XYZAL) 5 MG tablet; TAKE 1 TABLET BY MOUTH EVERY DAY IN THE EVENING  Other allergic rhinitis -     levocetirizine (XYZAL) 5 MG tablet; TAKE 1 TABLET BY MOUTH EVERY DAY IN THE EVENING  Memory change  Tremor  Other orders -     fluticasone (FLONASE) 50 MCG/ACT nasal spray; Place 2 sprays into both nostrils daily. SPRAY 2 SPRAYS INTO EACH NOSTRIL EVERY DAY -     lisinopril (ZESTRIL) 20 MG tablet; Take  1 tablet (20 mg total) by mouth daily. -     Peppermint Oil (IBGARD) 90 MG CPCR; Take 1 tablet by mouth daily.    Follow up: pending labs, scan

## 2022-11-30 NOTE — Patient Instructions (Signed)
Abdominal discomfort He may just be having some indigestion and gas related to the recent symptoms.  Consider trial of Probiotic such Align or IB guard daily over the counter daily You are due for repeat CT abdomen pelvis given findings a year ago regarding pancreas and some diverticular findings.  Order placed for updated CT scan.    History of pancreatic cyst on scan last year.  Plan repeat CT abdomen pelvis.  Expect phone call about scheduling.  History of impaired fasting glucose-updated diabetes screening today.  Continue with healthy eating habits.  Avoid lots of sweets, junk food and processed foods.    Updated labs today as below  Atrial fibrillation  Follow up later this month as planned with cardiology.   Continue Eliquis twice daily anticoagulant and daily Toprol/Metoprolol  High blood pressure Continue Lisinopril daily 20mg    High cholesterol Continue Lipitor 10mg  daily in the evening  Allergies  I refilled your Xyzal oral tablet and nasal spray for allergy symptoms  Memory concerns, resting tremor Offered referral to neurology.   Referral placed last 05/2021, but I don't think he ever went.   Let me know if agreeable to referral

## 2022-12-01 NOTE — Progress Notes (Signed)
Blood sugar slightly elevated at 108.  Electrolytes are okay  Diabetes marker 6.4% at risk for diabetes.  Iron level is okay.  Continue plan for CT abdomen pelvis scan.  Expect a phone call about this.  Again trial of IBgard or other probiotic over-the-counter to help with symptoms of burping and belching.  Avoid spicy foods, gas triggering foods such as onions and cabbage and Brussels sprouts and fried foods.  Monitor your blood pressures at home.  If your blood pressures are routinely less than 120/70, such as 110/60 or less on a regular basis you may want to back off of lisinopril dose.  Your kidney marker is a little off suggesting that you may not be as hydrated as you need to be.  Sometimes the medications can have an effect on this as well

## 2022-12-16 ENCOUNTER — Ambulatory Visit (HOSPITAL_COMMUNITY): Payer: Medicare Other | Admitting: Internal Medicine

## 2022-12-22 ENCOUNTER — Ambulatory Visit (INDEPENDENT_AMBULATORY_CARE_PROVIDER_SITE_OTHER): Payer: Medicare Other

## 2022-12-22 DIAGNOSIS — Z0000 Encounter for general adult medical examination without abnormal findings: Secondary | ICD-10-CM | POA: Diagnosis not present

## 2022-12-22 NOTE — Patient Instructions (Addendum)
Matthew Mckay , Thank you for taking time to come for your Medicare Wellness Visit. I appreciate your ongoing commitment to your health goals. Please review the following plan we discussed and let me know if I can assist you in the future.   Referrals/Orders/Follow-Ups/Clinician Recommendations: none  This is a list of the screening recommended for you and due dates:  Health Maintenance  Topic Date Due   Zoster (Shingles) Vaccine (1 of 2) Never done   Flu Shot  11/19/2022   COVID-19 Vaccine (4 - 2023-24 season) 12/20/2022   Medicare Annual Wellness Visit  12/22/2023   DTaP/Tdap/Td vaccine (2 - Tdap) 04/09/2030   Pneumonia Vaccine  Completed   HPV Vaccine  Aged Out    Advanced directives: (ACP Link)Information on Advanced Care Planning can be found at [facility] Advance Health Care Directives Advance Health Care Directives ([email or web address]   Next Medicare Annual Wellness Visit scheduled for next year: Yes  insert Preventive Care attachment Insert FALL PREVENTION attachment if needed

## 2022-12-22 NOTE — Progress Notes (Signed)
Subjective:   Matthew Mckay is a 82 y.o. male who presents for Medicare Annual/Subsequent preventive examination.  Visit Complete: Virtual  I connected with  Matthew Mckay on 12/22/22 by a audio enabled telemedicine application and verified that I am speaking with the correct person using two identifiers.  Patient Location: Home  Provider Location: Office/Clinic  I discussed the limitations of evaluation and management by telemedicine. The patient expressed understanding and agreed to proceed.  Vital Signs: Unable to obtain new vitals due to this being a telehealth visit.  Review of Systems     Cardiac Risk Factors include: advanced age (>58men, >67 women);dyslipidemia;hypertension;male gender     Objective:    Today's Vitals   There is no height or weight on file to calculate BMI.     12/22/2022   12:08 PM 07/22/2022    4:00 PM 10/10/2021    9:18 AM 10/14/2019    5:31 AM 10/14/2019    2:53 AM 02/10/2019    2:54 PM  Advanced Directives  Does Patient Have a Medical Advance Directive? No No No No No No  Would patient like information on creating a medical advance directive?  No - Patient declined   No - Patient declined No - Patient declined    Current Medications (verified) Outpatient Encounter Medications as of 12/22/2022  Medication Sig   atorvastatin (LIPITOR) 10 MG tablet Take 1 tablet (10 mg total) by mouth daily.   Cholecalciferol (VITAMIN D3) 75 MCG (3000 UT) TABS Take 75 mcg by mouth daily.   ELIQUIS 5 MG TABS tablet TAKE 1 TABLET BY MOUTH TWICE A DAY   famotidine (PEPCID) 40 MG tablet TAKE 1 TABLET BY MOUTH EVERY DAY   fluticasone (FLONASE) 50 MCG/ACT nasal spray Place 2 sprays into both nostrils daily. SPRAY 2 SPRAYS INTO EACH NOSTRIL EVERY DAY   levocetirizine (XYZAL) 5 MG tablet TAKE 1 TABLET BY MOUTH EVERY DAY IN THE EVENING   lisinopril (ZESTRIL) 20 MG tablet Take 1 tablet (20 mg total) by mouth daily.   Peppermint Oil (IBGARD) 90 MG CPCR Take 1 tablet by  mouth daily.   Salicylic Acid, Acne, (SALICYLIC ACID EX) Apply 1 Application topically at bedtime. 50% Ointment   metoprolol succinate (TOPROL-XL) 25 MG 24 hr tablet Take 1 tablet (25 mg total) by mouth daily. Take with or immediately following a meal.   No facility-administered encounter medications on file as of 12/22/2022.    Allergies (verified) Pollen extract-tree extract [pollen extract]   History: Past Medical History:  Diagnosis Date   Allergy    Anxiety    Chronic sinusitis    Diverticulosis    sigmoid colon and increased vascularization due to prior radiation, per 2013 colonoscopy   GERD (gastroesophageal reflux disease)    Hemorrhoid    Hyperlipidemia    Hypertension    Prostate cancer (HCC)    history of radiation therapy   Seasonal allergies    Urinary retention 2019   Past Surgical History:  Procedure Laterality Date   CARDIOVERSION N/A 09/02/2022   Procedure: CARDIOVERSION;  Surgeon: Maisie Fus, MD;  Location: [facility];  Service: Cardiovascular;  Laterality: N/A;   COLONOSCOPY  02/2012   sigmoid diverticulosis, polyps, Dr. Vida Rigger   CYSTOSCOPY WITH URETHRAL DILATATION N/A 02/10/2019   Procedure: CYSTOSCOPY WITH URETHRAL DILATATION,;  Surgeon: Sebastian Ache, MD;  Location: [facility];  Service: Urology;  Laterality: N/A;   PROSTATECTOMY     1990s   Family History  Problem Relation Age of Onset   Cancer Brother        stomach   Arthritis Brother    Arthritis Brother    Hypertension Brother    Hypertension Brother    Hypertension Mother    Cancer Father        lung, smoked 2 ppd   Cancer Paternal Uncle        cancer   Cancer Paternal Uncle        stomach   Social History   Socioeconomic History   Marital status: Married    Spouse name: Not on file   Number of children: Not on file   Years of education: Not on file   Highest education level: Not on file  Occupational History   Not on file  Tobacco Use   Smoking status: Never    Smokeless tobacco: Never  Vaping Use   Vaping status: Never Used  Substance and Sexual Activity   Alcohol use: No   Drug use: No   Sexual activity: Not on file  Other Topics Concern   Not on file  Social History Narrative   Lives alone. Widowed in 2009.   Handles own ADLs.   Watches [employer], always busy, yard work, garden work, works at [employer].    Goes to [place]. [place].   6 brothers and 3 sisters.  Originally from [place], brought up in [place].  Father is [place], mother from [place]. Has family all over the place, [place], [place], [place], [place]   Social Determinants of Health   Financial Resource Strain: Low Risk  (12/22/2022)   Overall Financial Resource Strain (CARDIA)    Difficulty of Paying Living Expenses: Not hard at all  Food Insecurity: No Food Insecurity (12/22/2022)   Hunger Vital Sign    Worried About Running Out of Food in the Last Year: Never true    Ran Out of Food in the Last Year: Never true  Transportation Needs: No Transportation Needs (12/22/2022)   PRAPARE - Administrator, Civil Service (Medical): No    Lack of Transportation (Non-Medical): No  Physical Activity: Inactive (12/22/2022)   Exercise Vital Sign    Days of Exercise per Week: 0 days    Minutes of Exercise per Session: 0 min  Stress: No Stress Concern Present (12/22/2022)   [employer] of Occupational Health - Occupational Stress Questionnaire    Feeling of Stress : Not at all  Social Connections: Moderately Isolated (12/22/2022)   Social Connection and Isolation Panel [NHANES]    Frequency of Communication with Friends and Family: More than three times a week    Frequency of Social Gatherings with Friends and Family: More than three times a week    Attends Religious Services: More than 4 times per year    Active Member of [employer] or Organizations: No    Attends Banker Meetings: Never    Marital Status: Widowed    Tobacco Counseling Counseling given: Not  Answered   Clinical Intake:  Pre-visit preparation completed: Yes  Pain : No/denies pain     Nutritional Risks: None Diabetes: No  How often do you need to have someone help you when you read instructions, pamphlets, or other written materials from your doctor or pharmacy?: 1 - Never  Interpreter Needed?: No  Information entered by :: NAllen LPN   Activities of Daily Living    12/22/2022   12:03 PM 09/02/2022    7:28 AM  In your  present state of health, do you have any difficulty performing the following activities:  Hearing? 0 0  Vision? 0 0  Difficulty concentrating or making decisions? 0 0  Walking or climbing stairs? 0 0  Dressing or bathing? 0 0  Doing errands, shopping? 0   Preparing Food and eating ? N   Using the Toilet? N   In the past six months, have you accidently leaked urine? Y   Comment wears a depends   Do you have problems with loss of bowel control? N   Managing your Medications? N   Managing your Finances? N   Housekeeping or managing your Housekeeping? N     Patient Care Team: Tysinger, Kermit Balo, PA-C as PCP - General (Family Medicine)  Indicate any recent Medical Services you may have received from other than [facility] providers in the past year (date may be approximate).     Assessment:   This is a routine wellness examination for Moody.  Hearing/Vision screen Hearing Screening - Comments:: Denies hearing issues Vision Screening - Comments:: Regular eye exams, [facility]  Dietary issues and exercise activities discussed:     Goals Addressed             This Visit's Progress    Patient Stated       12/22/2022, stay healthy       Depression Screen    12/22/2022   12:11 PM 12/12/2021   10:29 AM 10/10/2021    9:20 AM 05/22/2021    9:41 AM 02/13/2021   10:48 AM 01/09/2021    8:46 AM 05/26/2018   11:30 AM  PHQ 2/9 Scores  PHQ - 2 Score 0 0 0 0 0 0 0  PHQ- 9 Score 0  0        Fall Risk    12/22/2022   12:09 PM 12/12/2021    10:28 AM 10/10/2021    9:20 AM 05/22/2021    9:41 AM 02/13/2021   10:48 AM  Fall Risk   Falls in the past year? 0 0 0 0 0  Number falls in past yr: 0 0 0 0 0  Injury with Fall? 0 0 0 0 0  Risk for fall due to : Medication side effect No Fall Risks Medication side effect No Fall Risks No Fall Risks  Follow up Falls prevention discussed;Falls evaluation completed Falls evaluation completed Falls evaluation completed;Education provided;Falls prevention discussed Falls evaluation completed Falls evaluation completed    MEDICARE RISK AT HOME: Medicare Risk at Home Any stairs in or around the home?: Yes If so, are there any without handrails?: No Home free of loose throw rugs in walkways, pet beds, electrical cords, etc?: Yes Adequate lighting in your home to reduce risk of falls?: Yes Life alert?: No Use of a cane, walker or w/c?: No Grab bars in the bathroom?: No Shower chair or bench in shower?: No Elevated toilet seat or a handicapped toilet?: Yes  TIMED UP AND GO:  Was the test performed?  No    Cognitive Function:        12/22/2022   12:12 PM 10/10/2021    9:23 AM  6CIT Screen  What Year? 0 points 0 points  What month? 0 points 0 points  What time? 0 points 0 points  Count back from 20 0 points 0 points  Months in reverse 4 points 0 points  Repeat phrase 2 points 4 points  Total Score 6 points 4 points    Immunizations Immunization  History  Administered Date(s) Administered   Fluad Quad(high Dose 65+) 01/20/2019, 02/08/2020, 02/13/2021, 02/16/2022   Influenza Split 03/27/2011, 01/29/2012   Influenza, High Dose Seasonal PF 01/31/2013, 01/26/2014, 01/21/2015, 01/21/2016, 02/04/2017, 01/27/2018   PFIZER(Purple Top)SARS-COV-2 Vaccination 06/11/2019, 07/05/2019, 04/11/2020   Pneumococcal Conjugate-13 02/12/2015   Pneumococcal Polysaccharide-23 03/27/2011   Td 04/09/2020    TDAP status: Up to date  Flu Vaccine status: Due, Education has been provided regarding the  importance of this vaccine. Advised may receive this vaccine at local pharmacy or Health Dept. Aware to provide a copy of the vaccination record if obtained from local pharmacy or Health Dept. Verbalized acceptance and understanding.  Pneumococcal vaccine status: Up to date  Covid-19 vaccine status: Information provided on how to obtain vaccines.   Qualifies for Shingles Vaccine? Yes   Zostavax completed No   Shingrix Completed?: No.    Education has been provided regarding the importance of this vaccine. Patient has been advised to call insurance company to determine out of pocket expense if they have not yet received this vaccine. Advised may also receive vaccine at local pharmacy or Health Dept. Verbalized acceptance and understanding.  Screening Tests Health Maintenance  Topic Date Due   Zoster Vaccines- Shingrix (1 of 2) Never done   INFLUENZA VACCINE  11/19/2022   COVID-19 Vaccine (4 - 2023-24 season) 12/20/2022   Medicare Annual Wellness (AWV)  12/22/2023   DTaP/Tdap/Td (2 - Tdap) 04/09/2030   Pneumonia Vaccine 70+ Years old  Completed   HPV VACCINES  Aged Out    Health Maintenance  Health Maintenance Due  Topic Date Due   Zoster Vaccines- Shingrix (1 of 2) Never done   INFLUENZA VACCINE  11/19/2022   COVID-19 Vaccine (4 - 2023-24 season) 12/20/2022    Colorectal cancer screening: No longer required.   Lung Cancer Screening: (Low Dose CT Chest recommended if Age 46-80 years, 20 pack-year currently smoking OR have quit w/in 15years.) does not qualify.   Lung Cancer Screening Referral: no  Additional Screening:  Hepatitis C Screening: does not qualify;   Vision Screening: Recommended annual ophthalmology exams for early detection of glaucoma and other disorders of the eye. Is the patient up to date with their annual eye exam?  Yes  Who is the provider or what is the name of the office in which the patient attends annual eye exams? [facility] If pt is not  established with a provider, would they like to be referred to a provider to establish care? No .   Dental Screening: Recommended annual dental exams for proper oral hygiene  Diabetic Foot Exam: n/a  Community Resource Referral / Chronic Care Management: CRR required this visit?  No   CCM required this visit?  No     Plan:     I have personally reviewed and noted the following in the patient's chart:   Medical and social history Use of alcohol, tobacco or illicit drugs  Current medications and supplements including opioid prescriptions. Patient is not currently taking opioid prescriptions. Functional ability and status Nutritional status Physical activity Advanced directives List of other physicians Hospitalizations, surgeries, and ER visits in previous 12 months Vitals Screenings to include cognitive, depression, and falls Referrals and appointments  In addition, I have reviewed and discussed with patient certain preventive protocols, quality metrics, and best practice recommendations. A written personalized care plan for preventive services as well as general preventive health recommendations were provided to patient.     Barb Merino, LPN  12/22/2022   After Visit Summary: (MyChart) Due to this being a telephonic visit, the after visit summary with patients personalized plan was offered to patient via MyChart   Nurse Notes: none

## 2022-12-31 ENCOUNTER — Ambulatory Visit (HOSPITAL_COMMUNITY): Payer: Medicare Other | Admitting: Internal Medicine

## 2022-12-31 ENCOUNTER — Encounter (HOSPITAL_COMMUNITY): Payer: Self-pay

## 2023-01-06 ENCOUNTER — Telehealth: Payer: Self-pay | Admitting: Medical

## 2023-01-06 NOTE — Telephone Encounter (Signed)
Please call his son Matthew Mckay that we interact with   he recently had a house call home health visit.  There have been concerns both on the recent home health house call and prior visits here were out of made reference to refer him to neurology in the past due to memory.  He declined prior about 1 to see if his son wanted to talk with his father about seeing neurology for baseline consult and possibly even consider a medicine like Aricept to help with mild memory loss.

## 2023-01-07 NOTE — Telephone Encounter (Signed)
Spoke to [employer], and he said no to getting baseline from Neurology. He said his memory is sharp for his age and he doesn't need a referral. He says everyone is just trying to cycle him through the [facility] and he doesn't want that for his dad.

## 2023-02-02 DIAGNOSIS — N481 Balanitis: Secondary | ICD-10-CM | POA: Diagnosis not present

## 2023-02-02 DIAGNOSIS — R310 Gross hematuria: Secondary | ICD-10-CM | POA: Diagnosis not present

## 2023-02-08 ENCOUNTER — Other Ambulatory Visit (INDEPENDENT_AMBULATORY_CARE_PROVIDER_SITE_OTHER): Payer: Medicare Other

## 2023-02-08 DIAGNOSIS — Z23 Encounter for immunization: Secondary | ICD-10-CM

## 2023-02-25 ENCOUNTER — Emergency Department (HOSPITAL_COMMUNITY)
Admission: EM | Admit: 2023-02-25 | Discharge: 2023-02-26 | Disposition: A | Discharge status: Home / Self Care | Payer: Medicare Other | Attending: Emergency Medicine | Admitting: Emergency Medicine

## 2023-02-25 ENCOUNTER — Other Ambulatory Visit: Payer: Self-pay

## 2023-02-25 DIAGNOSIS — I10 Essential (primary) hypertension: Secondary | ICD-10-CM | POA: Insufficient documentation

## 2023-02-25 DIAGNOSIS — Z8546 Personal history of malignant neoplasm of prostate: Secondary | ICD-10-CM | POA: Diagnosis not present

## 2023-02-25 DIAGNOSIS — N3001 Acute cystitis with hematuria: Secondary | ICD-10-CM | POA: Diagnosis not present

## 2023-02-25 DIAGNOSIS — R319 Hematuria, unspecified: Secondary | ICD-10-CM | POA: Diagnosis present

## 2023-02-25 LAB — COMPREHENSIVE METABOLIC PANEL
ALT: 15 U/L (ref 0–44)
AST: 16 U/L (ref 15–41)
Albumin: 3.4 g/dL — ABNORMAL LOW (ref 3.5–5.0)
Alkaline Phosphatase: 42 U/L (ref 38–126)
Anion gap: 9 (ref 5–15)
BUN: 20 mg/dL (ref 8–23)
CO2: 25 mmol/L (ref 22–32)
Calcium: 8.9 mg/dL (ref 8.9–10.3)
Chloride: 102 mmol/L (ref 98–111)
Creatinine, Ser: 0.69 mg/dL (ref 0.61–1.24)
GFR, Estimated: >60 mL/min (ref >60)
Glucose, Bld: 156 mg/dL — ABNORMAL HIGH (ref 70–99)
Potassium: 3.9 mmol/L (ref 3.5–5.1)
Sodium: 136 mmol/L (ref 135–145)
Total Bilirubin: 0.5 mg/dL (ref <1.2)
Total Protein: 7.2 g/dL (ref 6.5–8.1)

## 2023-02-25 LAB — URINALYSIS, MICROSCOPIC (REFLEX)

## 2023-02-25 LAB — CBC WITH DIFFERENTIAL/PLATELET
Abs Immature Granulocytes: 0.03 10*3/uL (ref 0.00–0.07)
Basophils Absolute: 0.1 10*3/uL (ref 0.0–0.1)
Basophils Relative: 1 %
Eosinophils Absolute: 0.2 10*3/uL (ref 0.0–0.5)
Eosinophils Relative: 3 %
HCT: 42.3 % (ref 39.0–52.0)
Hemoglobin: 13.8 g/dL (ref 13.0–17.0)
Immature Granulocytes: 0 %
Lymphocytes Relative: 34 %
Lymphs Abs: 3 10*3/uL (ref 0.7–4.0)
MCH: 29.9 pg (ref 26.0–34.0)
MCHC: 32.6 g/dL (ref 30.0–36.0)
MCV: 91.6 fL (ref 80.0–100.0)
Monocytes Absolute: 0.7 10*3/uL (ref 0.1–1.0)
Monocytes Relative: 8 %
Neutro Abs: 4.7 10*3/uL (ref 1.7–7.7)
Neutrophils Relative %: 54 %
Platelets: 230 10*3/uL (ref 150–400)
RBC: 4.62 MIL/uL (ref 4.22–5.81)
RDW: 12.8 % (ref 11.5–15.5)
WBC: 8.7 10*3/uL (ref 4.0–10.5)
nRBC: 0 % (ref 0.0–0.2)

## 2023-02-25 LAB — URINALYSIS, ROUTINE W REFLEX MICROSCOPIC
Glucose, UA: NEGATIVE mg/dL
Ketones, ur: 15 mg/dL — AB
Nitrite: POSITIVE — AB
Protein, ur: >300 mg/dL — AB
Specific Gravity, Urine: 1.01 (ref 1.005–1.030)
pH: 8.5 — ABNORMAL HIGH (ref 5.0–8.0)

## 2023-02-25 LAB — PROTIME-INR
INR: 1.2 (ref 0.8–1.2)
Prothrombin Time: 15.2 s (ref 11.4–15.2)

## 2023-02-25 MED ORDER — CEPHALEXIN 500 MG PO CAPS: 500.0000 mg | ORAL_CAPSULE | Freq: Three times a day (TID) | ORAL | 0 refills | Status: AC

## 2023-02-25 MED ORDER — CEPHALEXIN 250 MG PO CAPS
500.0000 mg | ORAL_CAPSULE | Freq: Once | ORAL | Status: AC
  Administered 2023-02-26: 500 mg via ORAL
  Filled 2023-02-25: qty 2

## 2023-02-25 NOTE — Discharge Instructions (Signed)
You were evaluated in the Emergency Department and after careful evaluation, we did not find any emergent condition requiring admission or further testing in the hospital.  Your exam/testing today is overall reassuring.  Symptoms seem to be due to a urinary tract infection.  Take the Keflex antibiotics as directed and follow-up with your regular doctors.  Please return to the Emergency Department if you experience any worsening of your condition.   Thank you for allowing [place] to be a part of your care.

## 2023-02-25 NOTE — ED Triage Notes (Signed)
Patient reports multiple hematuria today with dysuria , history of prostate CA , denies fever or chills . No indwelling catheter .

## 2023-02-25 NOTE — ED Provider Notes (Signed)
[facility] Emergency Department Provider Note MRN:  [id number]  Arrival date & time: 02/25/23     Chief Complaint   Hematuria   History of Present Illness   Matthew Mckay is a 82 y.o. year-old male with a history of hypertension, prostate cancer presenting to the ED with chief complaint of hematuria.  Burning with urination and hematuria today.  No fever, no flank pain, no abdominal pain.  Passed a few clots.  Review of Systems  A thorough review of systems was obtained and all systems are negative except as noted in the HPI and PMH.   Patient's Health History    Past Medical History:  Diagnosis Date   Allergy    Anxiety    Chronic sinusitis    Diverticulosis    sigmoid colon and increased vascularization due to prior radiation, per 2013 colonoscopy   GERD (gastroesophageal reflux disease)    Hemorrhoid    Hyperlipidemia    Hypertension    Prostate cancer (HCC)    history of radiation therapy   Seasonal allergies    Urinary retention 2019    Past Surgical History:  Procedure Laterality Date   CARDIOVERSION N/A 09/02/2022   Procedure: CARDIOVERSION;  Surgeon: Maisie Fus, MD;  Location: [facility] IN[facility];  Service: Cardiovascular;  Laterality: N/A;   COLONOSCOPY  02/2012   sigmoid diverticulosis, polyps, Dr. Vida Rigger   CYSTOSCOPY WITH URETHRAL DILATATION N/A 02/10/2019   Procedure: CYSTOSCOPY WITH URETHRAL DILATATION,;  Surgeon: Sebastian Ache, MD;  Location: [facility];  Service: Urology;  Laterality: N/A;   PROSTATECTOMY     1990s    Family History  Problem Relation Age of Onset   Cancer Brother        stomach   Arthritis Brother    Arthritis Brother    Hypertension Brother    Hypertension Brother    Hypertension Mother    Cancer Father        lung, smoked 2 ppd   Cancer Paternal Uncle        cancer   Cancer Paternal Uncle        stomach    Social History   Socioeconomic History   Marital status: Married    Spouse name:  Not on file   Number of children: Not on file   Years of education: Not on file   Highest education level: Not on file  Occupational History   Not on file  Tobacco Use   Smoking status: Never   Smokeless tobacco: Never  Vaping Use   Vaping status: Never Used  Substance and Sexual Activity   Alcohol use: No   Drug use: No   Sexual activity: Not on file  Other Topics Concern   Not on file  Social History Narrative   Lives alone. Widowed in 2009.   Handles own ADLs.   Watches [employer], always busy, yard work, garden work, works at [employer].    Goes to [place]. [place].   6 brothers and 3 sisters.  Originally from [place], brought up in [place].  Father is [place], mother from [place]. Has family all over the place, [place], [place], [place], [place]   Social Determinants of Health   Financial Resource Strain: Low Risk  (12/22/2022)   Overall Financial Resource Strain (CARDIA)    Difficulty of Paying Living Expenses: Not hard at all  Food Insecurity: No Food Insecurity (12/22/2022)   Hunger Vital Sign    Worried About Running Out  of Food in the Last Year: Never true    Ran Out of Food in the Last Year: Never true  Transportation Needs: No Transportation Needs (12/22/2022)   PRAPARE - Administrator, Civil Service (Medical): No    Lack of Transportation (Non-Medical): No  Physical Activity: Inactive (12/22/2022)   Exercise Vital Sign    Days of Exercise per Week: 0 days    Minutes of Exercise per Session: 0 min  Stress: No Stress Concern Present (12/22/2022)   [employer] of Occupational Health - Occupational Stress Questionnaire    Feeling of Stress : Not at all  Social Connections: Moderately Isolated (12/22/2022)   Social Connection and Isolation Panel [NHANES]    Frequency of Communication with Friends and Family: More than three times a week    Frequency of Social Gatherings with Friends and Family: More than three times a week    Attends Religious Services: More than 4  times per year    Active Member of [employer] or Organizations: No    Attends Banker Meetings: Never    Marital Status: Widowed  Intimate Partner Violence: Not At Risk (12/22/2022)   Humiliation, Afraid, Rape, and Kick questionnaire    Fear of Current or Ex-Partner: No    Emotionally Abused: No    Physically Abused: No    Sexually Abused: No     Physical Exam   Vitals:   02/25/23 2049  BP: (!) 163/85  Pulse: 63  Resp: 20  Temp: 97.7 F (36.5 C)  SpO2: 100%    CONSTITUTIONAL: Well-appearing, NAD NEURO/PSYCH:  Alert and oriented x 3, no focal deficits EYES:  eyes equal and reactive ENT/NECK:  no LAD, no JVD CARDIO: Regular rate, well-perfused, normal S1 and S2 PULM:  CTAB no wheezing or rhonchi GI/GU:  non-distended, non-tender MSK/SPINE:  No gross deformities, no edema SKIN:  no rash, atraumatic   *Additional and/or pertinent findings included in MDM below  Diagnostic and Interventional Summary    EKG Interpretation Date/Time:    Ventricular Rate:    PR Interval:    QRS Duration:    QT Interval:    QTC Calculation:   R Axis:      Text Interpretation:         Labs Reviewed  URINALYSIS, ROUTINE W REFLEX MICROSCOPIC - Abnormal; Notable for the following components:      Result Value   Color, Urine RED (*)    APPearance TURBID (*)    pH 8.5 (*)    Hgb urine dipstick LARGE (*)    Bilirubin Urine MODERATE (*)    Ketones, ur 15 (*)    Protein, ur >300 (*)    Nitrite POSITIVE (*)    Leukocytes,Ua LARGE (*)    All other components within normal limits  COMPREHENSIVE METABOLIC PANEL - Abnormal; Notable for the following components:   Glucose, Bld 156 (*)    Albumin 3.4 (*)    All other components within normal limits  URINALYSIS, MICROSCOPIC (REFLEX) - Abnormal; Notable for the following components:   Bacteria, UA   (*)    Value: TEST NOT REPORTED DUE TO COLOR INTERFERENCE OF URINE PIGMENT   All other components within normal limits  CBC WITH  DIFFERENTIAL/PLATELET  PROTIME-INR    No orders to display    Medications  cephALEXin (KEFLEX) capsule 500 mg (has no administration in time range)     Procedures  /  Critical Care Procedures  ED Course and Medical Decision  Making  Initial Impression and Ddx Suspect UTI, other considerations include complications related to prostate cancer or bladder cancer though suspect this to be much less likely.  Past medical/surgical history that increases complexity of ED encounter: Prostate cancer  Interpretation of Diagnostics I personally reviewed the laboratory assessment and my interpretation is as follows: No blood count or electrolyte disturbance  Urinalysis is nitrite positive, large blood, suspicious for infection.  Patient Reassessment and Ultimate Disposition/Management     Given the urinalysis results and patient's well-appearing nature, normal vitals, completely benign and nontender abdomen there is no indication for advanced imaging or further testing, appropriate for discharge on antibiotics.  Patient management required discussion with the following services or consulting groups:  None  Complexity of Problems Addressed Acute illness or injury that poses threat of life of bodily function  Additional Data Reviewed and Analyzed Further history obtained from: Further history from spouse/family member  Additional Factors Impacting ED Encounter Risk Prescriptions  Elmer Sow. Pilar Plate, MD [facility] Emergency Medicine [facility] [email or web address]  Final Clinical Impressions(s) / ED Diagnoses     ICD-10-CM   1. Acute cystitis with hematuria  N30.01       ED Discharge Orders          Ordered    cephALEXin (KEFLEX) 500 MG capsule  3 times daily        02/25/23 2337             Discharge Instructions Discussed with and Provided to Patient:    Discharge Instructions      You were evaluated in the Emergency Department and after careful  evaluation, we did not find any emergent condition requiring admission or further testing in the hospital.  Your exam/testing today is overall reassuring.  Symptoms seem to be due to a urinary tract infection.  Take the Keflex antibiotics as directed and follow-up with your regular doctors.  Please return to the Emergency Department if you experience any worsening of your condition.   Thank you for allowing [place] to be a part of your care.      Sabas Sous, MD 02/25/23 [phone]

## 2023-02-26 NOTE — ED Notes (Signed)
Discharge instructions reviewed.   Opportunity for questions and concerns provided.   Newly prescribed medications discussed. Pharmacy verified.   Alert, oriented and ambulatory. Displays no signs of distress.

## 2023-03-11 ENCOUNTER — Telehealth: Payer: Self-pay | Admitting: *Deleted

## 2023-03-11 NOTE — Telephone Encounter (Signed)
Transition Care Management Unsuccessful Follow-up Telephone Call  Date of discharge and from where:  [facility] [place]  02/26/2023  Attempts:  1st Attempt  Reason for unsuccessful TCM follow-up call:  No answer/busy

## 2023-03-16 DIAGNOSIS — K5730 Diverticulosis of large intestine without perforation or abscess without bleeding: Secondary | ICD-10-CM | POA: Diagnosis not present

## 2023-03-16 DIAGNOSIS — R310 Gross hematuria: Secondary | ICD-10-CM | POA: Diagnosis not present

## 2023-03-16 DIAGNOSIS — N281 Cyst of kidney, acquired: Secondary | ICD-10-CM | POA: Diagnosis not present

## 2023-03-17 ENCOUNTER — Emergency Department (HOSPITAL_COMMUNITY)
Admission: EM | Admit: 2023-03-17 | Discharge: 2023-03-18 | Disposition: A | Discharge status: Home / Self Care | Payer: Medicare Other | Attending: Emergency Medicine | Admitting: Emergency Medicine

## 2023-03-17 ENCOUNTER — Encounter (HOSPITAL_COMMUNITY): Payer: Self-pay

## 2023-03-17 ENCOUNTER — Other Ambulatory Visit: Payer: Self-pay

## 2023-03-17 DIAGNOSIS — I10 Essential (primary) hypertension: Secondary | ICD-10-CM | POA: Insufficient documentation

## 2023-03-17 DIAGNOSIS — Z79899 Other long term (current) drug therapy: Secondary | ICD-10-CM | POA: Insufficient documentation

## 2023-03-17 DIAGNOSIS — Z7901 Long term (current) use of anticoagulants: Secondary | ICD-10-CM | POA: Diagnosis not present

## 2023-03-17 DIAGNOSIS — R300 Dysuria: Secondary | ICD-10-CM | POA: Diagnosis not present

## 2023-03-17 DIAGNOSIS — R310 Gross hematuria: Secondary | ICD-10-CM | POA: Diagnosis not present

## 2023-03-17 DIAGNOSIS — R319 Hematuria, unspecified: Secondary | ICD-10-CM | POA: Diagnosis not present

## 2023-03-17 DIAGNOSIS — R6889 Other general symptoms and signs: Secondary | ICD-10-CM | POA: Diagnosis not present

## 2023-03-17 DIAGNOSIS — Z743 Need for continuous supervision: Secondary | ICD-10-CM | POA: Diagnosis not present

## 2023-03-17 DIAGNOSIS — Z8546 Personal history of malignant neoplasm of prostate: Secondary | ICD-10-CM | POA: Diagnosis not present

## 2023-03-17 LAB — CBC WITH DIFFERENTIAL/PLATELET
Abs Immature Granulocytes: 0.04 10*3/uL (ref 0.00–0.07)
Basophils Absolute: 0 10*3/uL (ref 0.0–0.1)
Basophils Relative: 0 %
Eosinophils Absolute: 0 10*3/uL (ref 0.0–0.5)
Eosinophils Relative: 0 %
HCT: 44.5 % (ref 39.0–52.0)
Hemoglobin: 14.7 g/dL (ref 13.0–17.0)
Immature Granulocytes: 0 %
Lymphocytes Relative: 16 %
Lymphs Abs: 2 10*3/uL (ref 0.7–4.0)
MCH: 30.4 pg (ref 26.0–34.0)
MCHC: 33 g/dL (ref 30.0–36.0)
MCV: 91.9 fL (ref 80.0–100.0)
Monocytes Absolute: 0.9 10*3/uL (ref 0.1–1.0)
Monocytes Relative: 7 %
Neutro Abs: 9.6 10*3/uL — ABNORMAL HIGH (ref 1.7–7.7)
Neutrophils Relative %: 77 %
Platelets: 233 10*3/uL (ref 150–400)
RBC: 4.84 MIL/uL (ref 4.22–5.81)
RDW: 12.4 % (ref 11.5–15.5)
WBC: 12.7 10*3/uL — ABNORMAL HIGH (ref 4.0–10.5)
nRBC: 0 % (ref 0.0–0.2)

## 2023-03-17 LAB — URINALYSIS, W/ REFLEX TO CULTURE (INFECTION SUSPECTED)
Bacteria, UA: NONE SEEN
RBC / HPF: >50 RBC/hpf (ref 0–5)
Squamous Epithelial / HPF: NONE SEEN /[HPF] (ref 0–5)

## 2023-03-17 LAB — COMPREHENSIVE METABOLIC PANEL
ALT: 16 U/L (ref 0–44)
AST: 18 U/L (ref 15–41)
Albumin: 3.9 g/dL (ref 3.5–5.0)
Alkaline Phosphatase: 51 U/L (ref 38–126)
Anion gap: 10 (ref 5–15)
BUN: 27 mg/dL — ABNORMAL HIGH (ref 8–23)
CO2: 25 mmol/L (ref 22–32)
Calcium: 9.6 mg/dL (ref 8.9–10.3)
Chloride: 101 mmol/L (ref 98–111)
Creatinine, Ser: 0.93 mg/dL (ref 0.61–1.24)
GFR, Estimated: >60 mL/min (ref >60)
Glucose, Bld: 149 mg/dL — ABNORMAL HIGH (ref 70–99)
Potassium: 3.9 mmol/L (ref 3.5–5.1)
Sodium: 136 mmol/L (ref 135–145)
Total Bilirubin: 0.7 mg/dL (ref <1.2)
Total Protein: 8 g/dL (ref 6.5–8.1)

## 2023-03-17 LAB — PROTIME-INR
INR: 1.3 — ABNORMAL HIGH (ref 0.8–1.2)
Prothrombin Time: 16.6 s — ABNORMAL HIGH (ref 11.4–15.2)

## 2023-03-17 MED ORDER — SODIUM CHLORIDE 0.9 % IV SOLN
1.0000 g | Freq: Once | INTRAVENOUS | Status: AC
  Administered 2023-03-17: 1 g via INTRAVENOUS
  Filled 2023-03-17: qty 10

## 2023-03-17 MED ORDER — ACETAMINOPHEN 500 MG PO TABS
1000.0000 mg | ORAL_TABLET | Freq: Once | ORAL | Status: AC
  Administered 2023-03-17: 1000 mg via ORAL
  Filled 2023-03-17: qty 2

## 2023-03-17 NOTE — ED Triage Notes (Signed)
Pt c/o not being able to urinate the past 4 hours and has drank a lot of water to try and go. States had prostate issues in the 90s but not since then

## 2023-03-17 NOTE — ED Notes (Signed)
Bladder scan repeated as ordered by provider. Dr. Jearld Fenton notified that results were 212, 326, 259. Pt also reports pain significantly decreased to 5/10 at this time.

## 2023-03-17 NOTE — ED Provider Notes (Signed)
[place] EMERGENCY DEPARTMENT AT [facility] Provider Note   CSN: [id number] Arrival date & time: 03/17/23  1814     History {Add pertinent medical, surgical, social history, OB history to HPI:1} Chief Complaint  Patient presents with   Urinary Retention    Matthew Mckay is a 82 y.o. male with PMH as listed below who presents with ***.  Pt c/o not being able to urinate the past 4 hours and has drank a lot of water to try and go. States had prostate issues in the 90s but not since then   Past Medical History:  Diagnosis Date   Allergy    Anxiety    Chronic sinusitis    Diverticulosis    sigmoid colon and increased vascularization due to prior radiation, per 2013 colonoscopy   GERD (gastroesophageal reflux disease)    Hemorrhoid    Hyperlipidemia    Hypertension    Prostate cancer (HCC)    history of radiation therapy   Seasonal allergies    Urinary retention 2019       Home Medications Prior to Admission medications   Medication Sig Start Date End Date Taking? Authorizing Provider  atorvastatin (LIPITOR) 10 MG tablet Take 1 tablet (10 mg total) by mouth daily. 08/24/22   Tysinger, Kermit Balo, PA-C  Cholecalciferol (VITAMIN D3) 75 MCG (3000 UT) TABS Take 75 mcg by mouth daily.    [provider]  ELIQUIS 5 MG TABS tablet TAKE 1 TABLET BY MOUTH TWICE A DAY 11/30/22   Tysinger, Kermit Balo, PA-C  famotidine (PEPCID) 40 MG tablet TAKE 1 TABLET BY MOUTH EVERY DAY 09/21/22   Tysinger, Kermit Balo, PA-C  fluticasone (FLONASE) 50 MCG/ACT nasal spray Place 2 sprays into both nostrils daily. SPRAY 2 SPRAYS INTO EACH NOSTRIL EVERY DAY 11/30/22   Tysinger, Kermit Balo, PA-C  levocetirizine (XYZAL) 5 MG tablet TAKE 1 TABLET BY MOUTH EVERY DAY IN THE EVENING 11/30/22   Tysinger, Kermit Balo, PA-C  lisinopril (ZESTRIL) 20 MG tablet Take 1 tablet (20 mg total) by mouth daily. 11/30/22   Tysinger, Kermit Balo, PA-C  metoprolol succinate (TOPROL-XL) 25 MG 24 hr tablet Take 1 tablet (25 mg  total) by mouth daily. Take with or immediately following a meal. 09/15/22 11/30/22  Eustace Pen, PA-C  Peppermint Oil (IBGARD) 90 MG CPCR Take 1 tablet by mouth daily. 11/30/22   Tysinger, Kermit Balo, PA-C  Salicylic Acid, Acne, (SALICYLIC ACID EX) Apply 1 Application topically at bedtime. 50% Ointment 08/08/22   [provider]      Allergies    Pollen extract-tree extract [pollen extract]    Review of Systems   Review of Systems A 10 point review of systems was performed and is negative unless otherwise reported in HPI.  Physical Exam Updated Vital Signs There were no vitals taken for this visit. Physical Exam General: Normal appearing {Desc; male/male:11659}, lying in bed.  HEENT: PERRLA, Sclera anicteric, MMM, trachea midline.  Cardiology: RRR, no murmurs/rubs/gallops. BL radial and DP pulses equal bilaterally.  Resp: Normal respiratory rate and effort. CTAB, no wheezes, rhonchi, crackles.  Abd: Soft, non-tender, non-distended. No rebound tenderness or guarding.  GU: Deferred. MSK: No peripheral edema or signs of trauma. Extremities without deformity or TTP. No cyanosis or clubbing. Skin: warm, dry. No rashes or lesions. Back: No CVA tenderness Neuro: A&Ox4, CNs II-XII grossly intact. MAEs. Sensation grossly intact.  Psych: Normal mood and affect.   ED Results / Procedures / Treatments   Labs (  all labs ordered are listed, but only abnormal results are displayed) Labs Reviewed - No data to display  EKG None  Radiology No results found.  Procedures Procedures  {Document cardiac monitor, telemetry assessment procedure when appropriate:1}  Medications Ordered in ED Medications - No data to display  ED Course/ Medical Decision Making/ A&P                          Medical Decision Making Amount and/or Complexity of Data Reviewed Labs: ordered.    This patient presents to the ED for concern of ***, this involves an extensive number of treatment options,  and is a complaint that carries with it a high risk of complications and morbidity.  I considered the following differential and admission for this acute, potentially life threatening condition.   MDM:    ***     Labs: I Ordered, and personally interpreted labs.  The pertinent results include:  ***  Imaging Studies ordered: I ordered imaging studies including *** I independently visualized and interpreted imaging. I agree with the radiologist interpretation  Additional history obtained from ***.  External records from outside source obtained and reviewed including ***  Cardiac Monitoring: The patient was maintained on a cardiac monitor.  I personally viewed and interpreted the cardiac monitored which showed an underlying rhythm of: ***  Reevaluation: After the interventions noted above, I reevaluated the patient and found that they have :{resolved/improved/worsened:23923::"improved"}  Social Determinants of Health: ***  Disposition:  ***  Co morbidities that complicate the patient evaluation  Past Medical History:  Diagnosis Date   Allergy    Anxiety    Chronic sinusitis    Diverticulosis    sigmoid colon and increased vascularization due to prior radiation, per 2013 colonoscopy   GERD (gastroesophageal reflux disease)    Hemorrhoid    Hyperlipidemia    Hypertension    Prostate cancer (HCC)    history of radiation therapy   Seasonal allergies    Urinary retention 2019     Medicines No orders of the defined types were placed in this encounter.   I have reviewed the patients home medicines and have made adjustments as needed  Problem List / ED Course: Problem List Items Addressed This Visit   None        {Document critical care time when appropriate:1} {Document review of labs and clinical decision tools ie heart score, Chads2Vasc2 etc:1}  {Document your independent review of radiology images, and any outside records:1} {Document your discussion with  family members, caretakers, and with consultants:1} {Document social determinants of health affecting pt's care:1} {Document your decision making why or why not admission, treatments were needed:1}  This note was created using dictation software, which may contain spelling or grammatical errors.

## 2023-03-18 MED ORDER — CEFPODOXIME PROXETIL 200 MG PO TABS: 200.0000 mg | ORAL_TABLET | Freq: Two times a day (BID) | ORAL | 0 refills | Status: AC

## 2023-03-18 NOTE — Discharge Instructions (Addendum)
Thank you for coming to [facility] Emergency Department. You were seen for blood in your urine. We did an exam, labs, and imaging, and these showed hematuria (blood in your urine). We recommended that you stay for bladder irrigation but you elected to be discharged to follow up in clinic. You were able to urinate prior to leaving the emergency department which helped your pain. We have prescribed cefpodoxime 200 mg BID for 7 days. Please follow up with urology within 1 week. You can also follow up with your cardiologist about your blood thinner. A urine culture was drawn today, and you will be notified if this is positive. You can take 1,000 mg tylenol every 8 hours for pain control.  Do not hesitate to return to the ED or call 911 if you experience: -Worsening symptoms -Severe abdominal or back pain -Inability to urinate -Lightheadedness, passing out -Fevers/chills -Anything else that concerns you

## 2023-03-19 LAB — URINE CULTURE: Culture: <10000 — AB

## 2023-04-08 ENCOUNTER — Telehealth: Payer: Self-pay

## 2023-04-08 NOTE — Progress Notes (Signed)
Transition Care Management Unsuccessful Follow-up Telephone Call  Date of discharge and from where:  03/18/2023 [facility]  Attempts:  1st Attempt  Reason for unsuccessful TCM follow-up call:  No answer/busy  Henley Boettner [facility]  [facility], [facility] Resource Care Guide Direct Dial: [phone]  Website: [email or web address]

## 2023-04-08 NOTE — Progress Notes (Signed)
Transition Care Management Unsuccessful Follow-up Telephone Call  Date of discharge and from where:  03/19/2023 [facility]  Attempts:  2nd Attempt  Reason for unsuccessful TCM follow-up call:  No answer/busy unable to leave voicemail.  Nataley Bahri [facility]  [facility], [facility] Guide Direct Dial: [phone]  Website: [email or web address]

## 2023-04-22 DIAGNOSIS — N320 Bladder-neck obstruction: Secondary | ICD-10-CM | POA: Diagnosis not present

## 2023-04-22 DIAGNOSIS — N3000 Acute cystitis without hematuria: Secondary | ICD-10-CM | POA: Diagnosis not present

## 2023-04-22 DIAGNOSIS — N481 Balanitis: Secondary | ICD-10-CM | POA: Diagnosis not present

## 2023-04-22 DIAGNOSIS — R310 Gross hematuria: Secondary | ICD-10-CM | POA: Diagnosis not present

## 2023-05-28 DIAGNOSIS — H01012 Ulcerative blepharitis right lower eyelid: Secondary | ICD-10-CM | POA: Diagnosis not present

## 2023-05-28 DIAGNOSIS — H01015 Ulcerative blepharitis left lower eyelid: Secondary | ICD-10-CM | POA: Diagnosis not present

## 2023-06-09 ENCOUNTER — Other Ambulatory Visit (HOSPITAL_COMMUNITY): Payer: Self-pay | Admitting: Internal Medicine

## 2023-06-27 ENCOUNTER — Emergency Department (HOSPITAL_COMMUNITY)

## 2023-06-27 ENCOUNTER — Other Ambulatory Visit: Payer: Self-pay

## 2023-06-27 ENCOUNTER — Encounter (HOSPITAL_COMMUNITY): Payer: Self-pay

## 2023-06-27 ENCOUNTER — Inpatient Hospital Stay (HOSPITAL_COMMUNITY)

## 2023-06-27 ENCOUNTER — Inpatient Hospital Stay (HOSPITAL_COMMUNITY)
Admission: EM | Admit: 2023-06-27 | Discharge: 2023-07-02 | DRG: 872 | Disposition: A | Discharge status: Skilled Nursing Facility | Attending: Internal Medicine | Admitting: Internal Medicine

## 2023-06-27 DIAGNOSIS — M62838 Other muscle spasm: Secondary | ICD-10-CM | POA: Diagnosis present

## 2023-06-27 DIAGNOSIS — N281 Cyst of kidney, acquired: Secondary | ICD-10-CM | POA: Diagnosis not present

## 2023-06-27 DIAGNOSIS — R278 Other lack of coordination: Secondary | ICD-10-CM | POA: Diagnosis not present

## 2023-06-27 DIAGNOSIS — N3041 Irradiation cystitis with hematuria: Secondary | ICD-10-CM | POA: Diagnosis present

## 2023-06-27 DIAGNOSIS — R935 Abnormal findings on diagnostic imaging of other abdominal regions, including retroperitoneum: Secondary | ICD-10-CM | POA: Diagnosis not present

## 2023-06-27 DIAGNOSIS — I10 Essential (primary) hypertension: Secondary | ICD-10-CM | POA: Diagnosis present

## 2023-06-27 DIAGNOSIS — A419 Sepsis, unspecified organism: Principal | ICD-10-CM

## 2023-06-27 DIAGNOSIS — K219 Gastro-esophageal reflux disease without esophagitis: Secondary | ICD-10-CM | POA: Diagnosis present

## 2023-06-27 DIAGNOSIS — E785 Hyperlipidemia, unspecified: Secondary | ICD-10-CM | POA: Diagnosis present

## 2023-06-27 DIAGNOSIS — K862 Cyst of pancreas: Secondary | ICD-10-CM | POA: Diagnosis present

## 2023-06-27 DIAGNOSIS — J301 Allergic rhinitis due to pollen: Secondary | ICD-10-CM | POA: Diagnosis present

## 2023-06-27 DIAGNOSIS — S199XXA Unspecified injury of neck, initial encounter: Secondary | ICD-10-CM | POA: Diagnosis not present

## 2023-06-27 DIAGNOSIS — M6282 Rhabdomyolysis: Secondary | ICD-10-CM | POA: Diagnosis not present

## 2023-06-27 DIAGNOSIS — M6281 Muscle weakness (generalized): Secondary | ICD-10-CM | POA: Diagnosis not present

## 2023-06-27 DIAGNOSIS — R9082 White matter disease, unspecified: Secondary | ICD-10-CM | POA: Diagnosis not present

## 2023-06-27 DIAGNOSIS — W19XXXA Unspecified fall, initial encounter: Secondary | ICD-10-CM | POA: Diagnosis present

## 2023-06-27 DIAGNOSIS — I4891 Unspecified atrial fibrillation: Secondary | ICD-10-CM | POA: Diagnosis not present

## 2023-06-27 DIAGNOSIS — R339 Retention of urine, unspecified: Secondary | ICD-10-CM | POA: Diagnosis not present

## 2023-06-27 DIAGNOSIS — N401 Enlarged prostate with lower urinary tract symptoms: Secondary | ICD-10-CM | POA: Diagnosis present

## 2023-06-27 DIAGNOSIS — K5730 Diverticulosis of large intestine without perforation or abscess without bleeding: Secondary | ICD-10-CM | POA: Diagnosis not present

## 2023-06-27 DIAGNOSIS — Z79899 Other long term (current) drug therapy: Secondary | ICD-10-CM

## 2023-06-27 DIAGNOSIS — R338 Other retention of urine: Secondary | ICD-10-CM | POA: Diagnosis present

## 2023-06-27 DIAGNOSIS — Z8261 Family history of arthritis: Secondary | ICD-10-CM | POA: Diagnosis not present

## 2023-06-27 DIAGNOSIS — S0990XA Unspecified injury of head, initial encounter: Secondary | ICD-10-CM | POA: Diagnosis not present

## 2023-06-27 DIAGNOSIS — N35811 Other urethral stricture, male, meatal: Secondary | ICD-10-CM | POA: Diagnosis not present

## 2023-06-27 DIAGNOSIS — E872 Acidosis, unspecified: Secondary | ICD-10-CM | POA: Diagnosis not present

## 2023-06-27 DIAGNOSIS — N390 Urinary tract infection, site not specified: Secondary | ICD-10-CM | POA: Diagnosis not present

## 2023-06-27 DIAGNOSIS — Z751 Person awaiting admission to adequate facility elsewhere: Secondary | ICD-10-CM

## 2023-06-27 DIAGNOSIS — Z1152 Encounter for screening for COVID-19: Secondary | ICD-10-CM

## 2023-06-27 DIAGNOSIS — M6259 Muscle wasting and atrophy, not elsewhere classified, multiple sites: Secondary | ICD-10-CM | POA: Diagnosis not present

## 2023-06-27 DIAGNOSIS — R7989 Other specified abnormal findings of blood chemistry: Secondary | ICD-10-CM

## 2023-06-27 DIAGNOSIS — R2681 Unsteadiness on feet: Secondary | ICD-10-CM | POA: Diagnosis not present

## 2023-06-27 DIAGNOSIS — K429 Umbilical hernia without obstruction or gangrene: Secondary | ICD-10-CM | POA: Diagnosis not present

## 2023-06-27 DIAGNOSIS — Z923 Personal history of irradiation: Secondary | ICD-10-CM

## 2023-06-27 DIAGNOSIS — Y92009 Unspecified place in unspecified non-institutional (private) residence as the place of occurrence of the external cause: Secondary | ICD-10-CM

## 2023-06-27 DIAGNOSIS — T796XXS Traumatic ischemia of muscle, sequela: Secondary | ICD-10-CM | POA: Diagnosis not present

## 2023-06-27 DIAGNOSIS — R531 Weakness: Secondary | ICD-10-CM | POA: Diagnosis not present

## 2023-06-27 DIAGNOSIS — I509 Heart failure, unspecified: Secondary | ICD-10-CM

## 2023-06-27 DIAGNOSIS — I2489 Other forms of acute ischemic heart disease: Secondary | ICD-10-CM | POA: Diagnosis not present

## 2023-06-27 DIAGNOSIS — Z7901 Long term (current) use of anticoagulants: Secondary | ICD-10-CM

## 2023-06-27 DIAGNOSIS — Z8546 Personal history of malignant neoplasm of prostate: Secondary | ICD-10-CM | POA: Diagnosis not present

## 2023-06-27 DIAGNOSIS — Y842 Radiological procedure and radiotherapy as the cause of abnormal reaction of the patient, or of later complication, without mention of misadventure at the time of the procedure: Secondary | ICD-10-CM | POA: Diagnosis present

## 2023-06-27 DIAGNOSIS — Z7401 Bed confinement status: Secondary | ICD-10-CM | POA: Diagnosis not present

## 2023-06-27 DIAGNOSIS — F419 Anxiety disorder, unspecified: Secondary | ICD-10-CM | POA: Diagnosis present

## 2023-06-27 DIAGNOSIS — Z9079 Acquired absence of other genital organ(s): Secondary | ICD-10-CM | POA: Diagnosis not present

## 2023-06-27 DIAGNOSIS — R059 Cough, unspecified: Secondary | ICD-10-CM | POA: Diagnosis not present

## 2023-06-27 DIAGNOSIS — R296 Repeated falls: Secondary | ICD-10-CM | POA: Diagnosis not present

## 2023-06-27 DIAGNOSIS — Z87898 Personal history of other specified conditions: Secondary | ICD-10-CM

## 2023-06-27 DIAGNOSIS — S0083XA Contusion of other part of head, initial encounter: Secondary | ICD-10-CM | POA: Diagnosis not present

## 2023-06-27 DIAGNOSIS — N35919 Unspecified urethral stricture, male, unspecified site: Secondary | ICD-10-CM

## 2023-06-27 DIAGNOSIS — E876 Hypokalemia: Secondary | ICD-10-CM | POA: Diagnosis not present

## 2023-06-27 DIAGNOSIS — Z743 Need for continuous supervision: Secondary | ICD-10-CM | POA: Diagnosis not present

## 2023-06-27 DIAGNOSIS — N3040 Irradiation cystitis without hematuria: Secondary | ICD-10-CM | POA: Diagnosis not present

## 2023-06-27 DIAGNOSIS — N320 Bladder-neck obstruction: Secondary | ICD-10-CM | POA: Diagnosis not present

## 2023-06-27 DIAGNOSIS — Z8673 Personal history of transient ischemic attack (TIA), and cerebral infarction without residual deficits: Secondary | ICD-10-CM

## 2023-06-27 DIAGNOSIS — R251 Tremor, unspecified: Secondary | ICD-10-CM | POA: Diagnosis present

## 2023-06-27 DIAGNOSIS — Z8249 Family history of ischemic heart disease and other diseases of the circulatory system: Secondary | ICD-10-CM | POA: Diagnosis not present

## 2023-06-27 DIAGNOSIS — I700 Atherosclerosis of aorta: Secondary | ICD-10-CM | POA: Diagnosis not present

## 2023-06-27 DIAGNOSIS — R6889 Other general symptoms and signs: Secondary | ICD-10-CM | POA: Diagnosis not present

## 2023-06-27 DIAGNOSIS — I499 Cardiac arrhythmia, unspecified: Secondary | ICD-10-CM | POA: Diagnosis not present

## 2023-06-27 DIAGNOSIS — R404 Transient alteration of awareness: Secondary | ICD-10-CM | POA: Diagnosis not present

## 2023-06-27 LAB — COMPREHENSIVE METABOLIC PANEL
ALT: 14 U/L (ref 0–44)
AST: 32 U/L (ref 15–41)
Albumin: 2.7 g/dL — ABNORMAL LOW (ref 3.5–5.0)
Alkaline Phosphatase: 34 U/L — ABNORMAL LOW (ref 38–126)
Anion gap: 10 (ref 5–15)
BUN: 31 mg/dL — ABNORMAL HIGH (ref 8–23)
CO2: 22 mmol/L (ref 22–32)
Calcium: 9.4 mg/dL (ref 8.9–10.3)
Chloride: 108 mmol/L (ref 98–111)
Creatinine, Ser: 1.23 mg/dL (ref 0.61–1.24)
GFR, Estimated: 58 mL/min — ABNORMAL LOW (ref >60)
Glucose, Bld: 186 mg/dL — ABNORMAL HIGH (ref 70–99)
Potassium: 3.5 mmol/L (ref 3.5–5.1)
Sodium: 140 mmol/L (ref 135–145)
Total Bilirubin: 1 mg/dL (ref 0.0–1.2)
Total Protein: 6.5 g/dL (ref 6.5–8.1)

## 2023-06-27 LAB — I-STAT CHEM 8, ED
BUN: 39 mg/dL — ABNORMAL HIGH (ref 8–23)
Calcium, Ion: 1.13 mmol/L — ABNORMAL LOW (ref 1.15–1.40)
Chloride: 109 mmol/L (ref 98–111)
Creatinine, Ser: 1.1 mg/dL (ref 0.61–1.24)
Glucose, Bld: 175 mg/dL — ABNORMAL HIGH (ref 70–99)
HCT: 42 % (ref 39.0–52.0)
Hemoglobin: 14.3 g/dL (ref 13.0–17.0)
Potassium: 3.7 mmol/L (ref 3.5–5.1)
Sodium: 141 mmol/L (ref 135–145)
TCO2: 23 mmol/L (ref 22–32)

## 2023-06-27 LAB — CBC
HCT: 42.8 % (ref 39.0–52.0)
Hemoglobin: 13.8 g/dL (ref 13.0–17.0)
MCH: 29.6 pg (ref 26.0–34.0)
MCHC: 32.2 g/dL (ref 30.0–36.0)
MCV: 91.8 fL (ref 80.0–100.0)
Platelets: 165 10*3/uL (ref 150–400)
RBC: 4.66 MIL/uL (ref 4.22–5.81)
RDW: 13.3 % (ref 11.5–15.5)
WBC: 17.9 10*3/uL — ABNORMAL HIGH (ref 4.0–10.5)
nRBC: 0 % (ref 0.0–0.2)

## 2023-06-27 LAB — I-STAT CG4 LACTIC ACID, ED
Lactic Acid, Venous: 2.4 mmol/L (ref 0.5–1.9)
Lactic Acid, Venous: 1.4 mmol/L (ref 0.5–1.9)

## 2023-06-27 LAB — URINALYSIS, ROUTINE W REFLEX MICROSCOPIC
Bilirubin Urine: NEGATIVE
Glucose, UA: NEGATIVE mg/dL
Ketones, ur: NEGATIVE mg/dL
Nitrite: NEGATIVE
Protein, ur: 100 mg/dL — AB
Specific Gravity, Urine: 1.017 (ref 1.005–1.030)
WBC, UA: >50 WBC/hpf (ref 0–5)
pH: 5 (ref 5.0–8.0)

## 2023-06-27 LAB — PROTIME-INR
INR: 1.3 — ABNORMAL HIGH (ref 0.8–1.2)
Prothrombin Time: 16.6 s — ABNORMAL HIGH (ref 11.4–15.2)

## 2023-06-27 LAB — SAMPLE TO BLOOD BANK

## 2023-06-27 LAB — RESP PANEL BY RT-PCR (RSV, FLU A&B, COVID)  RVPGX2
Influenza A by PCR: NEGATIVE
Influenza B by PCR: NEGATIVE
Resp Syncytial Virus by PCR: NEGATIVE
SARS Coronavirus 2 by RT PCR: NEGATIVE

## 2023-06-27 LAB — PHOSPHORUS: Phosphorus: 2.4 mg/dL — ABNORMAL LOW (ref 2.5–4.6)

## 2023-06-27 LAB — ETHANOL: Alcohol, Ethyl (B): <10 mg/dL (ref <10)

## 2023-06-27 LAB — TROPONIN I (HIGH SENSITIVITY)
Troponin I (High Sensitivity): 90 ng/L — ABNORMAL HIGH (ref <18)
Troponin I (High Sensitivity): 89 ng/L — ABNORMAL HIGH (ref <18)

## 2023-06-27 LAB — CK: Total CK: 1051 U/L — ABNORMAL HIGH (ref 49–397)

## 2023-06-27 LAB — MAGNESIUM: Magnesium: 1.8 mg/dL (ref 1.7–2.4)

## 2023-06-27 MED ORDER — ACETAMINOPHEN 650 MG RE SUPP: 650.0000 mg | Freq: Four times a day (QID) | RECTAL | Status: DC | PRN

## 2023-06-27 MED ORDER — SODIUM CHLORIDE 0.9 % IV BOLUS
1000.0000 mL | Freq: Once | INTRAVENOUS | Status: AC
  Administered 2023-06-27: 1000 mL via INTRAVENOUS

## 2023-06-27 MED ORDER — ACETAMINOPHEN 325 MG PO TABS
650.0000 mg | ORAL_TABLET | Freq: Four times a day (QID) | ORAL | Status: DC | PRN
  Administered 2023-06-30: 650 mg via ORAL
  Filled 2023-06-27: qty 2

## 2023-06-27 MED ORDER — SODIUM CHLORIDE 0.9 % IV SOLN
2.0000 g | Freq: Once | INTRAVENOUS | Status: AC
  Administered 2023-06-27: 2 g via INTRAVENOUS
  Filled 2023-06-27: qty 20

## 2023-06-27 MED ORDER — ONDANSETRON HCL 4 MG PO TABS: 4.0000 mg | ORAL_TABLET | Freq: Four times a day (QID) | ORAL | Status: DC | PRN

## 2023-06-27 MED ORDER — METOPROLOL SUCCINATE ER 25 MG PO TB24
25.0000 mg | ORAL_TABLET | Freq: Once | ORAL | Status: AC
  Administered 2023-06-27: 25 mg via ORAL
  Filled 2023-06-27: qty 1

## 2023-06-27 MED ORDER — IOHEXOL 350 MG/ML SOLN
75.0000 mL | Freq: Once | INTRAVENOUS | Status: AC | PRN
  Administered 2023-06-27: 75 mL via INTRAVENOUS

## 2023-06-27 MED ORDER — SODIUM CHLORIDE 0.9 % IV SOLN: INTRAVENOUS | Status: AC

## 2023-06-27 MED ORDER — SODIUM CHLORIDE 0.9 % IV SOLN
2.0000 g | INTRAVENOUS | Status: DC
  Administered 2023-06-28 – 2023-07-02 (×5): 2 g via INTRAVENOUS
  Filled 2023-06-27 (×5): qty 20

## 2023-06-27 MED ORDER — HEPARIN BOLUS VIA INFUSION
2000.0000 [IU] | Freq: Once | INTRAVENOUS | Status: AC
  Administered 2023-06-27: 2000 [IU] via INTRAVENOUS
  Filled 2023-06-27: qty 2000

## 2023-06-27 MED ORDER — ONDANSETRON HCL 4 MG/2ML IJ SOLN
4.0000 mg | Freq: Four times a day (QID) | INTRAMUSCULAR | Status: DC | PRN
  Administered 2023-06-28: 4 mg via INTRAVENOUS
  Filled 2023-06-27: qty 2

## 2023-06-27 MED ORDER — LACTATED RINGERS IV SOLN: INTRAVENOUS | Status: DC

## 2023-06-27 MED ORDER — ACETAMINOPHEN 325 MG PO TABS
650.0000 mg | ORAL_TABLET | Freq: Once | ORAL | Status: AC
  Administered 2023-06-27: 650 mg via ORAL
  Filled 2023-06-27: qty 2

## 2023-06-27 MED ORDER — METOPROLOL SUCCINATE ER 25 MG PO TB24
25.0000 mg | ORAL_TABLET | Freq: Every day | ORAL | Status: DC
  Administered 2023-06-28 – 2023-07-02 (×5): 25 mg via ORAL
  Filled 2023-06-27 (×5): qty 1

## 2023-06-27 MED ORDER — HEPARIN (PORCINE) 25000 UT/250ML-% IV SOLN
1250.0000 [IU]/h | INTRAVENOUS | Status: DC
  Administered 2023-06-27: 1100 [IU]/h via INTRAVENOUS
  Administered 2023-06-28: 1250 [IU]/h via INTRAVENOUS
  Filled 2023-06-27 (×2): qty 250

## 2023-06-27 MED ORDER — ACETAMINOPHEN 500 MG PO TABS
500.0000 mg | ORAL_TABLET | Freq: Once | ORAL | Status: AC
  Administered 2023-06-27: 500 mg via ORAL
  Filled 2023-06-27: qty 1

## 2023-06-27 MED ORDER — LACTATED RINGERS IV BOLUS: 1000.0000 mL | Freq: Once | INTRAVENOUS | Status: DC

## 2023-06-27 NOTE — Assessment & Plan Note (Signed)
 Continue home toprol-xl

## 2023-06-27 NOTE — ED Notes (Addendum)
 Abrasion to the left shoulder Redness to the thoracic area

## 2023-06-27 NOTE — Progress Notes (Signed)
 PHARMACY - ANTICOAGULATION CONSULT NOTE  Pharmacy Consult for heparin  Indication: atrial fibrillation  Allergies  Allergen Reactions   Pollen Extract-Tree Extract [Pollen Extract]     Patient Measurements: Height: 5\' 9"  (175.3 cm) Weight: 74.8 kg (165 lb) IBW/kg (Calculated) : 70.7 Heparin Dosing Weight: 74.8kg   Vital Signs: Temp: 102.5 F (39.2 C) (03/09 1609) Temp Source: Tympanic (03/09 1523) BP: 117/66 (03/09 1640) Pulse Rate: 96 (03/09 1640)  Labs: Recent Labs    06/27/23 1217 06/27/23 1226 06/27/23 1515  HGB 13.8 14.3  --   HCT 42.8 42.0  --   PLT 165  --   --   LABPROT 16.6*  --   --   INR 1.3*  --   --   CREATININE 1.23 1.10  --   CKTOTAL 1,051*  --   --   TROPONINIHS 90*  --  89*    Estimated Creatinine Clearance: 50 mL/min (by C-G formula based on SCr of 1.1 mg/dL).   Medical History: Past Medical History:  Diagnosis Date   Allergy    Anxiety    Chronic sinusitis    Diverticulosis    sigmoid colon and increased vascularization due to prior radiation, per 2013 colonoscopy   GERD (gastroesophageal reflux disease)    Hemorrhoid    Hyperlipidemia    Hypertension    Prostate cancer (HCC)    history of radiation therapy   Seasonal allergies    Urinary retention 2019   Assessment: Patient presenting following a fall, hx of Afib supposed to be on Eliquis PTA. Spoke with patient, he said he stopped it several months ago due to significant hematuria. He said that a provider did not instruct him to stop it. Hemoglobin 14.3 and PLTs 165. Pharmacy consulted to dose heparin gtt.   Goal of Therapy:  Heparin level 0.3-0.7 units/ml aPTT 66-102 Monitor platelets by anticoagulation protocol: Yes   Plan:  Heparin 2000u x1, small bolus wiith hx of bleeding.  Start heparin infusion at 1100 units/hr Check anti-Xa level in 8 hours and daily while on heparin.  Continue to monitor H&H and platelets  Estill Batten, PharmD, BCCCP  06/27/2023,4:55 PM

## 2023-06-27 NOTE — Assessment & Plan Note (Addendum)
 Troponin flat, no chest pain EKG with no acute ST changes  Likely demand ischemia in setting of sepsis/afib  Continue tele monitoring

## 2023-06-27 NOTE — Assessment & Plan Note (Addendum)
 On fall x 4 hours or so, appears mild will trend Continue IVF x 24 hours  Hold lipitor  UA pending

## 2023-06-27 NOTE — Assessment & Plan Note (Signed)
 S/p radiation

## 2023-06-27 NOTE — Assessment & Plan Note (Signed)
Continue pepcid daily  

## 2023-06-27 NOTE — Progress Notes (Signed)
 Orthopedic Tech Progress Note Patient Details:  Matthew Mckay [birth year] [id number]  Patient ID: Matthew Mckay, male   DOB: August 12, 1940, 83 y.o.   MRN: [id number] Level II; not currently needed. Darleen Crocker 06/27/2023, 12:22 PM

## 2023-06-27 NOTE — Assessment & Plan Note (Signed)
 Hold lipitor in setting of mild rhabdomyolysis

## 2023-06-27 NOTE — Assessment & Plan Note (Signed)
 Appears to be intentional tremor Would re assess when not having chills to make sure no resting tremor especially in setting of falls/weakness/constipation and memory loss

## 2023-06-27 NOTE — ED Notes (Signed)
 Patient transported to CT

## 2023-06-27 NOTE — ED Provider Notes (Signed)
 Matthew Mckay   CSN: [id number] Arrival date & time: 06/27/23  1210     History  Chief Complaint  Patient presents with   Matthew Mckay is a 83 y.o. male.  Patient is an 83 year old male with a past medical history of A-fib on Eliquis, hypertension, CHF presenting to the emergency department after a fall.  The patient states that he went to the bathroom earlier this morning and when he was walking back to his room he felt weak and off balance and fell.  He states that he initially hit his head on the dresser and then bounced back and hit on the bed frame.  He states that he was unable to get himself up and EMS reports that he was laying on the bed frame for about 4 hours along the back of his neck.  They state that his daughter checked on him this morning and called 911.  The patient states that he has been feeling sick since last night and is felt chilled but has not checked his temperature at home.  He states he does have a history of recurrent UTIs and has had dysuria.  He states that he felt nauseous last night but has not vomited.  Denies any diarrhea.  He also reports he has had a recent runny nose and cough and is unsure if it is related to an illness or his allergies.  He denies any pain from the fall.  The history is provided by the patient and the EMS personnel.  Fall       Home Medications Prior to Admission medications   Medication Sig Start Date End Date Taking? Authorizing Provider  atorvastatin (LIPITOR) 10 MG tablet Take 1 tablet (10 mg total) by mouth daily. 08/24/22   Tysinger, Kermit Balo, PA-C  Cholecalciferol (VITAMIN D3) 75 MCG (3000 UT) TABS Take 75 mcg by mouth daily.    [provider]  ELIQUIS 5 MG TABS tablet TAKE 1 TABLET BY MOUTH TWICE A DAY 11/30/22   Tysinger, Kermit Balo, PA-C  famotidine (PEPCID) 40 MG tablet TAKE 1 TABLET BY MOUTH EVERY DAY 09/21/22   Tysinger, Kermit Balo, PA-C  fluticasone  (FLONASE) 50 MCG/ACT nasal spray Place 2 sprays into both nostrils daily. SPRAY 2 SPRAYS INTO EACH NOSTRIL EVERY DAY 11/30/22   Tysinger, Kermit Balo, PA-C  levocetirizine (XYZAL) 5 MG tablet TAKE 1 TABLET BY MOUTH EVERY DAY IN THE EVENING 11/30/22   Tysinger, Kermit Balo, PA-C  lisinopril (ZESTRIL) 20 MG tablet Take 1 tablet (20 mg total) by mouth daily. 11/30/22   Tysinger, Kermit Balo, PA-C  metoprolol succinate (TOPROL-XL) 25 MG 24 hr tablet Take 1 tablet (25 mg total) by mouth daily. Appt req for refill [id number] 06/09/23   Eustace Pen, PA-C  Peppermint Oil (IBGARD) 90 MG CPCR Take 1 tablet by mouth daily. 11/30/22   Tysinger, Kermit Balo, PA-C  Salicylic Acid, Acne, (SALICYLIC ACID EX) Apply 1 Application topically at bedtime. 50% Ointment 08/08/22   [provider]      Allergies    Pollen extract-tree extract [pollen extract]    Review of Systems   Review of Systems  Physical Exam Updated Vital Signs BP (!) 158/80   Pulse 87   Temp (!) 102.3 F (39.1 C) (Tympanic)   Resp (!) 27   Ht 5\' 9"  (1.753 m)   Wt 74.8 kg   SpO2 94%   BMI 24.37  kg/m  Physical Exam Vitals and nursing Mckay reviewed.  Constitutional:      General: He is not in acute distress.    Appearance: Normal appearance.  HENT:     Head: Normocephalic and atraumatic.     Nose: Nose normal.     Mouth/Throat:     Mouth: Mucous membranes are moist.     Pharynx: Oropharynx is clear.  Eyes:     Extraocular Movements: Extraocular movements intact.     Conjunctiva/sclera: Conjunctivae normal.     Pupils: Pupils are equal, round, and reactive to light.  Neck:     Comments: No midline neck tenderness, c-collar in place, some pressure redness to posterior neck Cardiovascular:     Rate and Rhythm: Normal rate and regular rhythm.     Heart sounds: Normal heart sounds.  Pulmonary:     Effort: Pulmonary effort is normal.     Breath sounds: Normal breath sounds.  Abdominal:     General: Abdomen is flat.     Palpations:  Abdomen is soft.     Tenderness: There is no abdominal tenderness.  Musculoskeletal:     Comments: No midline back tenderness No bony tenderness to bilateral upper or lower extremities  Skin:    General: Skin is warm and dry.     Comments: Small pressure wound to L shoulder, no tenderness  Neurological:     General: No focal deficit present.     Mental Status: He is alert and oriented to person, place, and time.  Psychiatric:        Mood and Affect: Mood normal.        Behavior: Behavior normal.     ED Results / Procedures / Treatments   Labs (all labs ordered are listed, but only abnormal results are displayed) Labs Reviewed  COMPREHENSIVE METABOLIC PANEL - Abnormal; Notable for the following components:      Result Value   Glucose, Bld 186 (*)    BUN 31 (*)    Albumin 2.7 (*)    Alkaline Phosphatase 34 (*)    GFR, Estimated 58 (*)    All other components within normal limits  CBC - Abnormal; Notable for the following components:   WBC 17.9 (*)    All other components within normal limits  PROTIME-INR - Abnormal; Notable for the following components:   Prothrombin Time 16.6 (*)    INR 1.3 (*)    All other components within normal limits  CK - Abnormal; Notable for the following components:   Total CK 1,051 (*)    All other components within normal limits  I-STAT CHEM 8, ED - Abnormal; Notable for the following components:   BUN 39 (*)    Glucose, Bld 175 (*)    Calcium, Ion 1.13 (*)    All other components within normal limits  I-STAT CG4 LACTIC ACID, ED - Abnormal; Notable for the following components:   Lactic Acid, Venous 2.4 (*)    All other components within normal limits  TROPONIN I (HIGH SENSITIVITY) - Abnormal; Notable for the following components:   Troponin I (High Sensitivity) 90 (*)    All other components within normal limits  RESP PANEL BY RT-PCR (RSV, FLU A&B, COVID)  [id number]  CULTURE, BLOOD (ROUTINE X 2)  CULTURE, BLOOD (ROUTINE X 2)  URINE CULTURE   ETHANOL  MAGNESIUM  URINALYSIS, ROUTINE W REFLEX MICROSCOPIC  I-STAT CG4 LACTIC ACID, ED  SAMPLE TO BLOOD BANK  TROPONIN I (HIGH SENSITIVITY)  EKG None  Radiology CT HEAD WO CONTRAST Result Date: 06/27/2023 CLINICAL DATA:  Head trauma, fall, on blood thinners EXAM: CT HEAD WITHOUT CONTRAST CT CERVICAL SPINE WITHOUT CONTRAST TECHNIQUE: Multidetector CT imaging of the head and cervical spine was performed following the standard protocol without intravenous contrast. Multiplanar CT image reconstructions of the cervical spine were also generated. RADIATION DOSE REDUCTION: This exam was performed according to the departmental dose-optimization program which includes automated exposure control, adjustment of the mA and/or kV according to patient size and/or use of iterative reconstruction technique. COMPARISON:  CT cervical spine, 10/14/2019 FINDINGS: CT HEAD FINDINGS Brain: No evidence of acute infarction, acute hemorrhage, hydrocephalus, or mass lesion/mass effect. Chronic, fluid attenuation left frontal hygroma measuring 0.7 cm in thickness (series 2, image 19, series 4, image 51). Encephalomalacia of the right cerebellar hemisphere (series 2, image 25). Periventricular white matter hypodensity. Vascular: No hyperdense vessel or unexpected calcification. Skull: Normal. Negative for fracture or focal lesion. Sinuses/Orbits: No acute finding. Other: Soft tissue contusion of the left forehead (series 2, image 15). CT CERVICAL SPINE FINDINGS Alignment: Degenerative straightening of the normal cervical lordosis. Skull base and vertebrae: No acute fracture. No primary bone lesion or focal pathologic process. Soft tissues and spinal canal: No prevertebral fluid or swelling. No visible canal hematoma. Disc levels: Mild moderate multilevel cervical disc degenerative disease, worst from C5-C7. Upper chest: Negative. Other: None. IMPRESSION: 1. No acute intracranial pathology. 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 2. Chronic, fluid attenuation left frontal hygroma measuring 0.7 cm in thickness. No acute hemorrhage. 3. Soft tissue contusion of the left forehead. 4. No fracture or static subluxation of the cervical spine. 5. Mild-to-moderate multilevel cervical disc degenerative disease, worst from C5-C7. Electronically Signed   By: Jearld Lesch M.D.   On: 06/27/2023 13:22   CT CERVICAL SPINE WO CONTRAST Result Date: 06/27/2023 CLINICAL DATA:  Head trauma, fall, on blood thinners EXAM: CT HEAD WITHOUT CONTRAST CT CERVICAL SPINE WITHOUT CONTRAST TECHNIQUE: Multidetector CT imaging of the head and cervical spine was performed following the standard protocol without intravenous contrast. Multiplanar CT image reconstructions of the cervical spine were also generated. RADIATION DOSE REDUCTION: This exam was performed according to the departmental dose-optimization program which includes automated exposure control, adjustment of the mA and/or kV according to patient size and/or use of iterative reconstruction technique. COMPARISON:  CT cervical spine, 10/14/2019 FINDINGS: CT HEAD FINDINGS Brain: No evidence of acute infarction, acute hemorrhage, hydrocephalus, or mass lesion/mass effect. Chronic, fluid attenuation left frontal hygroma measuring 0.7 cm in thickness (series 2, image 19, series 4, image 51). Encephalomalacia of the right cerebellar hemisphere (series 2, image 25). Periventricular white matter hypodensity. Vascular: No hyperdense vessel or unexpected calcification. Skull: Normal. Negative for fracture or focal lesion. Sinuses/Orbits: No acute finding. Other: Soft tissue contusion of the left forehead (series 2, image 15). CT CERVICAL SPINE FINDINGS Alignment: Degenerative straightening of the normal cervical lordosis. Skull base and vertebrae: No acute fracture. No primary bone lesion or focal pathologic process. Soft tissues and spinal canal: No prevertebral fluid or swelling. No  visible canal hematoma. Disc levels: Mild moderate multilevel cervical disc degenerative disease, worst from C5-C7. Upper chest: Negative. Other: None. IMPRESSION: 1. No acute intracranial pathology. 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 2. Chronic, fluid attenuation left frontal hygroma measuring 0.7 cm in thickness. No acute hemorrhage. 3. Soft tissue contusion of the left forehead. 4. No fracture or static subluxation of the cervical spine. 5.  Mild-to-moderate multilevel cervical disc degenerative disease, worst from C5-C7. Electronically Signed   By: Jearld Lesch M.D.   On: 06/27/2023 13:22   DG Chest Portable 1 View Result Date: 06/27/2023 CLINICAL DATA:  Fall.  Cough EXAM: PORTABLE CHEST 1 VIEW COMPARISON:  X-ray 07/23/2022 and older FINDINGS: Underinflation. Enlarged cardiopericardial silhouette. Calcified aorta. No pneumothorax, effusion or edema. Overlapping cardiac leads. Degenerative changes along the spine. IMPRESSION: Underinflation.  No acute cardiopulmonary disease. Electronically Signed   By: Karen Kays M.D.   On: 06/27/2023 12:34    Procedures Procedures    Medications Ordered in ED Medications  lactated ringers infusion ( Intravenous New Bag/Given 06/27/23 1512)  cefTRIAXone (ROCEPHIN) 2 g in sodium chloride 0.9 % 100 mL IVPB (2 g Intravenous New Bag/Given 06/27/23 1513)  sodium chloride 0.9 % bolus 1,000 mL (0 mLs Intravenous Stopped 06/27/23 1452)  metoprolol succinate (TOPROL-XL) 24 hr tablet 25 mg (25 mg Oral Given 06/27/23 1308)  acetaminophen (TYLENOL) tablet 650 mg (650 mg Oral Given 06/27/23 1516)    ED Course/ Medical Decision Making/ A&P Clinical Course as of 06/27/23 1532  Sun Jun 27, 2023  1315 Mildly elevated troponin, CK 1000 concerning for mild rhabdo, he's receiving IVF. Is intermittently going in and out of a fib with RVR and was given home metoprolol. Will need repeat troponin. Urine is pending. [VK]  1409 No acute traumatic injury  on CT imaging. Urine and repeat troponin pending. [VK]  1433 Patient C-spine cleared by myself.  Upon reassessment he does appear to be having rigors and he repeated his temperature and is now 102.6.  Will undergo sepsis workup.  With urinary symptoms suspect urinary source we will start antibiotics. [VK]    Clinical Course User Index [VK] Rexford Maus, DO                                 Medical Decision Making This patient presents to the ED with chief complaint(s) of fall, chills with pertinent past medical history of A fib on Eliquis, HTN, CHF which further complicates the presenting complaint. The complaint involves an extensive differential diagnosis and also carries with it a high risk of complications and morbidity.    The differential diagnosis includes due to patient's fall on thinners concern for ICH, mass effect, rhabdo, sepsis, infection, UTI, viral syndrome, C-spine injury, no other traumatic injuries seen on exam  Additional history obtained: Additional history obtained from EMS  Records reviewed Primary Care Documents  ED Course and Reassessment: On patient's arrival he was hemodynamically stable in no acute distress.  He was made a prehospital arrival level 2 trauma due to his fall on thinners and I was immediately present at bedside on his arrival.  Primary survey was intact.  Secondary survey was significant for some redness to the back of his neck as well as a superficial appearing pressure wound on the left shoulder.  Patient also reported recent chills with dysuria and nausea and will additionally undergo infectious workup to evaluate for alternative etiologies for his fall.  He declined any pain control at this time and will be closely reassessed.  Independent labs interpretation:  The following labs were independently interpreted: Mildly elevated CK of 8000 concerning for mild rhabdo, leukocytosis, mildly elevated troponin of 90, lactate 2.4  Independent  visualization of imaging: - I independently visualized the following imaging with scope of interpretation limited to determining acute life threatening conditions related  to emergency care: CT head/C-spine, chest x-ray, which revealed no acute disease  Consultation: - Consulted or discussed management/test interpretation w/ external professional: Hospitalists  Consideration for admission or further workup: Patient requires admission for sepsis Social Determinants of health: N/A    Amount and/or Complexity of Data Reviewed Labs: ordered. Radiology: ordered.  Risk OTC drugs. Prescription drug management. Decision regarding hospitalization.          Final Clinical Impression(s) / ED Diagnoses Final diagnoses:  Sepsis without acute organ dysfunction, due to unspecified organism [facility])  Fall in home, initial encounter    Rx / DC Orders ED Discharge Orders     None         Rexford Maus, DO 06/27/23 1532

## 2023-06-27 NOTE — ED Triage Notes (Signed)
 Pt [facility]IB[facility] from home after having a fall with head strike on eliquis. Pt fell around 0600-0700 this AM. Got up this AM had a trip over a pole, then fell backward into metal bed frame. Pt reports pain with urination. Alert and oriented, disoriented on scene originally.  140/70 110 hr 98% RA 237 Cbg  38 capno 26 rr

## 2023-06-27 NOTE — Assessment & Plan Note (Addendum)
 83 year old presenting to ED after a fall this morning, found down on ground x 4 hours, found to be septic with leukocytosis, tachycardia, lactic acidosis and fever to 102.3 secondary to UTI  -admit to progressive -received 1L IVF bolus, give additional 1L  -cultured -continue rocephin  -has some LLQ abdominal pain with diarrhea, check ct abdomen/pelvis  -CXR clear -continue IVF -lactic acidosis resolved  -antipyretics -trend CBC

## 2023-06-27 NOTE — Assessment & Plan Note (Addendum)
 CHADS2VASC score of 3.  S/p DCCV on 09/02/22  Family stopped eliquis in November due to gross hematuria  Had a discussion with daughter and they will think about this  Start heparin gtt for now, monitor urine and H&H

## 2023-06-27 NOTE — ED Notes (Signed)
 Trauma Response Nurse Documentation   Matthew Mckay is a 83 y.o. male arriving to [facility] ED via EMS  On Eliquis (apixaban) daily. Trauma was activated as a Level 2 by ED Charge RN based on the following trauma criteria Elderly patients > 65 with head trauma on anti-coagulation (excluding ASA).  Patient cleared for CT by Dr. Theresia Lo. Pt transported to CT with trauma response nurse present to monitor. RN remained with the patient throughout their absence from the department for clinical observation.   GCS 15.  History   Past Medical History:  Diagnosis Date   Allergy    Anxiety    Chronic sinusitis    Diverticulosis    sigmoid colon and increased vascularization due to prior radiation, per 2013 colonoscopy   GERD (gastroesophageal reflux disease)    Hemorrhoid    Hyperlipidemia    Hypertension    Prostate cancer (HCC)    history of radiation therapy   Seasonal allergies    Urinary retention 2019     Past Surgical History:  Procedure Laterality Date   CARDIOVERSION N/A 09/02/2022   Procedure: CARDIOVERSION;  Surgeon: Maisie Fus, MD;  Location: [facility] INVASIVE CV LAB;  Service: Cardiovascular;  Laterality: N/A;   COLONOSCOPY  02/2012   sigmoid diverticulosis, polyps, Dr. Vida Rigger   CYSTOSCOPY WITH URETHRAL DILATATION N/A 02/10/2019   Procedure: CYSTOSCOPY WITH URETHRAL DILATATION,;  Surgeon: Sebastian Ache, MD;  Location: [facility] ORS;  Service: Urology;  Laterality: N/A;   PROSTATECTOMY     1990s     Initial Focused Assessment (If applicable, or please see trauma documentation): Airway: intact, patent Breathing: Breath sounds clear, equal bilaterally. No SOB, RA.  Circulation: Pulses intact peripherally and  centrally.  No active bleeding.  Redness noted to R eye and also to top of pt's head with abrasions/skin rashes.  Pt in A-fib w/ some RVR. Abrasion to left shoulder and redness to chest. 20G PIV to R FA, 20G PIV to L FA Disability: MAE equally, PERRLA. No decreased  sensation, c-collar in place VS WDL w/ exception to a-fib.   CT's Completed:   CT Head and CT C-Spine   Interventions:  CXR CT head and c-spine Labs drawn 1L NS given  Metoprolol given due to RVR  Plan for disposition:  Other Awaiting labs and scan results  Consults completed:  none at 1330.  Event Summary: Pt bib [facility] after falling at home and striking his head.  Pt fell around 6-7am.  Pt states that he tripped and then fell backwards, striking his head on a metal bed frame.  Pt A/O x4 currently.  C-collar in place. C/O no pain currently.   Bedside handoff with ED RN Lexi.    Matthew Mckay  Trauma Response RN  Please call TRN at [phone] for further assistance.

## 2023-06-27 NOTE — Progress Notes (Signed)
 Elink is following code sepsis.

## 2023-06-27 NOTE — H&P (Signed)
 History and Physical    Patient: Matthew Mckay YQI:[id number] DOB: [birth year] DOA: 06/27/2023 DOS: the patient was seen and examined on 06/27/2023 PCP: Jac Canavan, PA-C  Patient coming from: Home - lives alone. Ambulates independently    Chief Complaint: fall  HPI: Matthew Mckay is a 83 y.o. male with medical history significant of GERD, HTN, HLD, hx of prostate cancer, atrial fibrillation, DDD, BPH with hx of urinary retention who presented to ED after a fall. He tells me he was up in bed changing his diaper and had bad muscle spasms in his calves that caused him to fall. He was stuck on the ground for about 4 hours. His daughter got worried when he didn't answer his phone and came over. She was unable to get him off the ground and called EMS. He endorses chills, denies any fevers. States he had some dysuria that recently started and always has urinary frequency. NO CVA tenderness   Daughter tells me he  may have fallen around 7AM and she got to this house around 11AM. She lives across the street from him. She tells me he has been sick all weekend. He had some constipation and took miralax and had a BM. On Friday he complained that his rectum was hurting because he kept going to the bathroom. He also took  He also had some urinary retention.   Denies any chills, vision changes/headaches, chest pain or palpitations, shortness of breath or cough, abdominal pain, N/V/D or leg swelling.   He does not smoke or drink alcohol.   ER Course:  vitals: temp: 102.3, bp: 141/85, HR: 92, RR: 17, oxygen: 97%RA Pertinent labs: wbc: 17.9, CK: 1051, lactic acid: 2.4, troponin 90 CXR: no acute finding CT head: no acute finding. Chronic right cerebellar infarct. Chronic, fluid attenuation left frontal hygroma measuring 0.7 cm in thickness. No acute hemorrhage. 3. Soft tissue contusion of the left forehead. CT cervical spine: No fracture or static subluxation of the cervical spine. Mild-to-moderate  multilevel cervical disc degenerative disease, worst from C5-C7. In ED: code sepsis. Given 1L IVF and started on IVF. Given rocephin, tylenol and toprol. BC obtained. TRH asked to admit.    Review of Systems: As mentioned in the history of present illness. All other systems reviewed and are negative. Past Medical History:  Diagnosis Date   Allergy    Anxiety    Chronic sinusitis    Diverticulosis    sigmoid colon and increased vascularization due to prior radiation, per 2013 colonoscopy   GERD (gastroesophageal reflux disease)    Hemorrhoid    Hyperlipidemia    Hypertension    Prostate cancer (HCC)    history of radiation therapy   Seasonal allergies    Urinary retention 2019   Past Surgical History:  Procedure Laterality Date   CARDIOVERSION N/A 09/02/2022   Procedure: CARDIOVERSION;  Surgeon: Maisie Fus, MD;  Location: [facility];  Service: Cardiovascular;  Laterality: N/A;   COLONOSCOPY  02/2012   sigmoid diverticulosis, polyps, Dr. Vida Rigger   CYSTOSCOPY WITH URETHRAL DILATATION N/A 02/10/2019   Procedure: CYSTOSCOPY WITH URETHRAL DILATATION,;  Surgeon: Sebastian Ache, MD;  Location: [facility];  Service: Urology;  Laterality: N/A;   PROSTATECTOMY     1990s   Social History:  reports that he has never smoked. He has never used smokeless tobacco. He reports that he does not drink alcohol and does not use drugs.  Allergies  Allergen Reactions   Pollen Extract-Tree Extract [  Pollen Extract]     Family History  Problem Relation Age of Onset   Cancer Brother        stomach   Arthritis Brother    Arthritis Brother    Hypertension Brother    Hypertension Brother    Hypertension Mother    Cancer Father        lung, smoked 2 ppd   Cancer Paternal Uncle        cancer   Cancer Paternal Uncle        stomach    Prior to Admission medications   Medication Sig Start Date End Date Taking? Authorizing Provider  atorvastatin (LIPITOR) 10 MG tablet Take 1 tablet  (10 mg total) by mouth daily. 08/24/22   Tysinger, Kermit Balo, PA-C  Cholecalciferol (VITAMIN D3) 75 MCG (3000 UT) TABS Take 75 mcg by mouth daily.    [provider]  ELIQUIS 5 MG TABS tablet TAKE 1 TABLET BY MOUTH TWICE A DAY 11/30/22   Tysinger, Kermit Balo, PA-C  famotidine (PEPCID) 40 MG tablet TAKE 1 TABLET BY MOUTH EVERY DAY 09/21/22   Tysinger, Kermit Balo, PA-C  fluticasone (FLONASE) 50 MCG/ACT nasal spray Place 2 sprays into both nostrils daily. SPRAY 2 SPRAYS INTO EACH NOSTRIL EVERY DAY 11/30/22   Tysinger, Kermit Balo, PA-C  levocetirizine (XYZAL) 5 MG tablet TAKE 1 TABLET BY MOUTH EVERY DAY IN THE EVENING 11/30/22   Tysinger, Kermit Balo, PA-C  lisinopril (ZESTRIL) 20 MG tablet Take 1 tablet (20 mg total) by mouth daily. 11/30/22   Tysinger, Kermit Balo, PA-C  metoprolol succinate (TOPROL-XL) 25 MG 24 hr tablet Take 1 tablet (25 mg total) by mouth daily. Appt req for refill [id number] 06/09/23   Eustace Pen, PA-C  Peppermint Oil (IBGARD) 90 MG CPCR Take 1 tablet by mouth daily. 11/30/22   Tysinger, Kermit Balo, PA-C  Salicylic Acid, Acne, (SALICYLIC ACID EX) Apply 1 Application topically at bedtime. 50% Ointment 08/08/22   [provider]    Physical Exam: Vitals:   06/27/23 1715 06/27/23 1730 06/27/23 1745 06/27/23 1803  BP:  113/66    Pulse:      Resp: (!) 22  19   Temp:    98.3 F (36.8 C)  TempSrc:    Oral  SpO2: 95%  95%   Weight:      Height:       General:  Appears calm and comfortable and is in NAD. Disheveled  Eyes:  PERRL, EOMI, normal lids, iris. Dried, crusted mucous on eyelids and drainage  ENT:  grossly normal hearing, lips & tongue, mmm; appropriate dentition Neck:  no LAD, masses or thyromegaly; no carotid bruits Cardiovascular:  irregularly, irregular, no m/r/g. trace LE edema.  Respiratory:   CTA bilaterally with no wheezes/rales/rhonchi.  Normal respiratory effort. Abdomen:  soft, NT, ND, NABS Back:   normal alignment, no CVAT Skin:  no rash or induration seen on  limited exam Musculoskeletal:  grossly normal tone BUE/BLE, good ROM, no bony abnormality Lower extremity:  trace LE edema.  Limited foot exam with no ulcerations.  2+ distal pulses. Psychiatric:  grossly normal mood and affect, speech fluent and appropriate, AOx3 Neurologic:  CN 2-12 grossly intact, moves all extremities in coordinated fashion, sensation intact. Tremor, but  has chills/fever    Radiological Exams on Admission: Independently reviewed - see discussion in A/P where applicable  CT HEAD WO CONTRAST Result Date: 06/27/2023 CLINICAL DATA:  Head trauma, fall, on blood thinners EXAM: CT HEAD WITHOUT  CONTRAST CT CERVICAL SPINE WITHOUT CONTRAST TECHNIQUE: Multidetector CT imaging of the head and cervical spine was performed following the standard protocol without intravenous contrast. Multiplanar CT image reconstructions of the cervical spine were also generated. RADIATION DOSE REDUCTION: This exam was performed according to the departmental dose-optimization program which includes automated exposure control, adjustment of the mA and/or kV according to patient size and/or use of iterative reconstruction technique. COMPARISON:  CT cervical spine, 10/14/2019 FINDINGS: CT HEAD FINDINGS Brain: No evidence of acute infarction, acute hemorrhage, hydrocephalus, or mass lesion/mass effect. Chronic, fluid attenuation left frontal hygroma measuring 0.7 cm in thickness (series 2, image 19, series 4, image 51). Encephalomalacia of the right cerebellar hemisphere (series 2, image 25). Periventricular white matter hypodensity. Vascular: No hyperdense vessel or unexpected calcification. Skull: Normal. Negative for fracture or focal lesion. Sinuses/Orbits: No acute finding. Other: Soft tissue contusion of the left forehead (series 2, image 15). CT CERVICAL SPINE FINDINGS Alignment: Degenerative straightening of the normal cervical lordosis. Skull base and vertebrae: No acute fracture. No primary bone lesion or  focal pathologic process. Soft tissues and spinal canal: No prevertebral fluid or swelling. No visible canal hematoma. Disc levels: Mild moderate multilevel cervical disc degenerative disease, worst from C5-C7. Upper chest: Negative. Other: None. IMPRESSION: 1. No acute intracranial pathology. 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 2. Chronic, fluid attenuation left frontal hygroma measuring 0.7 cm in thickness. No acute hemorrhage. 3. Soft tissue contusion of the left forehead. 4. No fracture or static subluxation of the cervical spine. 5. Mild-to-moderate multilevel cervical disc degenerative disease, worst from C5-C7. Electronically Signed   By: Jearld Lesch M.D.   On: 06/27/2023 13:22   CT CERVICAL SPINE WO CONTRAST Result Date: 06/27/2023 CLINICAL DATA:  Head trauma, fall, on blood thinners EXAM: CT HEAD WITHOUT CONTRAST CT CERVICAL SPINE WITHOUT CONTRAST TECHNIQUE: Multidetector CT imaging of the head and cervical spine was performed following the standard protocol without intravenous contrast. Multiplanar CT image reconstructions of the cervical spine were also generated. RADIATION DOSE REDUCTION: This exam was performed according to the departmental dose-optimization program which includes automated exposure control, adjustment of the mA and/or kV according to patient size and/or use of iterative reconstruction technique. COMPARISON:  CT cervical spine, 10/14/2019 FINDINGS: CT HEAD FINDINGS Brain: No evidence of acute infarction, acute hemorrhage, hydrocephalus, or mass lesion/mass effect. Chronic, fluid attenuation left frontal hygroma measuring 0.7 cm in thickness (series 2, image 19, series 4, image 51). Encephalomalacia of the right cerebellar hemisphere (series 2, image 25). Periventricular white matter hypodensity. Vascular: No hyperdense vessel or unexpected calcification. Skull: Normal. Negative for fracture or focal lesion. Sinuses/Orbits: No acute finding. Other:  Soft tissue contusion of the left forehead (series 2, image 15). CT CERVICAL SPINE FINDINGS Alignment: Degenerative straightening of the normal cervical lordosis. Skull base and vertebrae: No acute fracture. No primary bone lesion or focal pathologic process. Soft tissues and spinal canal: No prevertebral fluid or swelling. No visible canal hematoma. Disc levels: Mild moderate multilevel cervical disc degenerative disease, worst from C5-C7. Upper chest: Negative. Other: None. IMPRESSION: 1. No acute intracranial pathology. 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 2. Chronic, fluid attenuation left frontal hygroma measuring 0.7 cm in thickness. No acute hemorrhage. 3. Soft tissue contusion of the left forehead. 4. No fracture or static subluxation of the cervical spine. 5. Mild-to-moderate multilevel cervical disc degenerative disease, worst from C5-C7. Electronically Signed   By: Jearld Lesch M.D.   On: 06/27/2023 13:22  DG Chest Portable 1 View Result Date: 06/27/2023 CLINICAL DATA:  Fall.  Cough EXAM: PORTABLE CHEST 1 VIEW COMPARISON:  X-ray 07/23/2022 and older FINDINGS: Underinflation. Enlarged cardiopericardial silhouette. Calcified aorta. No pneumothorax, effusion or edema. Overlapping cardiac leads. Degenerative changes along the spine. IMPRESSION: Underinflation.  No acute cardiopulmonary disease. Electronically Signed   By: Karen Kays M.D.   On: 06/27/2023 12:34    EKG: Independently reviewed.  Atrial fib with rate 84; nonspecific ST changes with no evidence of acute ischemia   Labs on Admission: I have personally reviewed the available labs and imaging studies at the time of the admission.  Pertinent labs:   wbc: 17.9,  CK: 1051,  lactic acid: 2.4>1.4  troponin 90  Assessment and Plan: Principal Problem:   Sepsis (HCC) Active Problems:   Rhabdomyolysis   Elevated troponin   Atrial fibrillation (HCC)   Essential hypertension   GERD (gastroesophageal  reflux disease)   Hyperlipidemia   History of prostate cancer   Tremor    Assessment and Plan: * Sepsis (HCC) 83 year old presenting to ED after a fall this morning, found down on ground x 4 hours, found to be septic with leukocytosis, tachycardia, lactic acidosis and fever to 102.3 secondary to UTI  -admit to progressive -received 1L IVF bolus, give additional 1L  -cultured -continue rocephin  -has some LLQ abdominal pain with diarrhea, check ct abdomen/pelvis  -CXR clear -continue IVF -lactic acidosis resolved  -antipyretics -trend CBC   Rhabdomyolysis On fall x 4 hours or so, appears mild will trend Continue IVF x 24 hours  Hold lipitor  UA pending   Elevated troponin Troponin flat, no chest pain Likely demand ischemia in setting of sepsis/afib  Continue tele monitoring   Atrial fibrillation (HCC) CHADS2VASC score of 3.  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 Inpatient status is judged to be reasonable and necessary in order to provide the required intensity of service to ensure the patient's safety. The patient's presenting symptoms, physical exam findings, and initial  radiographic and laboratory data in the context of their chronic comorbidities is felt to place them at high risk for further clinical deterioration. Furthermore, it is not anticipated that the patient will be medically stable for discharge from the hospital within 2 midnights of admission.   * I certify that at the point of admission it is my clinical judgment that the patient will require inpatient hospital care spanning beyond 2 midnights from the point of admission due to high intensity of service, high risk for further deterioration and high frequency of surveillance required.*  Author: Orland Mustard, MD 06/27/2023 6:38 PM  For on call review [email or web address].

## 2023-06-28 ENCOUNTER — Telehealth: Payer: Self-pay | Admitting: Internal Medicine

## 2023-06-28 DIAGNOSIS — I4891 Unspecified atrial fibrillation: Secondary | ICD-10-CM | POA: Diagnosis not present

## 2023-06-28 DIAGNOSIS — T796XXS Traumatic ischemia of muscle, sequela: Secondary | ICD-10-CM | POA: Diagnosis not present

## 2023-06-28 DIAGNOSIS — A419 Sepsis, unspecified organism: Secondary | ICD-10-CM

## 2023-06-28 LAB — VITAMIN B12: Vitamin B-12: 520 pg/mL (ref 180–914)

## 2023-06-28 LAB — URINE CULTURE

## 2023-06-28 LAB — COMPREHENSIVE METABOLIC PANEL
ALT: 26 U/L (ref 0–44)
AST: 69 U/L — ABNORMAL HIGH (ref 15–41)
Albumin: 2.3 g/dL — ABNORMAL LOW (ref 3.5–5.0)
Alkaline Phosphatase: 34 U/L — ABNORMAL LOW (ref 38–126)
Anion gap: 8 (ref 5–15)
BUN: 31 mg/dL — ABNORMAL HIGH (ref 8–23)
CO2: 20 mmol/L — ABNORMAL LOW (ref 22–32)
Calcium: 8.3 mg/dL — ABNORMAL LOW (ref 8.9–10.3)
Chloride: 112 mmol/L — ABNORMAL HIGH (ref 98–111)
Creatinine, Ser: 1.14 mg/dL (ref 0.61–1.24)
GFR, Estimated: >60 mL/min (ref >60)
Glucose, Bld: 147 mg/dL — ABNORMAL HIGH (ref 70–99)
Potassium: 3.4 mmol/L — ABNORMAL LOW (ref 3.5–5.1)
Sodium: 140 mmol/L (ref 135–145)
Total Bilirubin: 0.7 mg/dL (ref 0.0–1.2)
Total Protein: 5.8 g/dL — ABNORMAL LOW (ref 6.5–8.1)

## 2023-06-28 LAB — MAGNESIUM: Magnesium: 1.7 mg/dL (ref 1.7–2.4)

## 2023-06-28 LAB — FOLATE: Folate: 7.6 ng/mL (ref >5.9)

## 2023-06-28 LAB — CBC
HCT: 38.4 % — ABNORMAL LOW (ref 39.0–52.0)
Hemoglobin: 12.5 g/dL — ABNORMAL LOW (ref 13.0–17.0)
MCH: 29.6 pg (ref 26.0–34.0)
MCHC: 32.6 g/dL (ref 30.0–36.0)
MCV: 91 fL (ref 80.0–100.0)
Platelets: 161 10*3/uL (ref 150–400)
RBC: 4.22 MIL/uL (ref 4.22–5.81)
RDW: 13.5 % (ref 11.5–15.5)
WBC: 21.4 10*3/uL — ABNORMAL HIGH (ref 4.0–10.5)
nRBC: 0 % (ref 0.0–0.2)

## 2023-06-28 LAB — BRAIN NATRIURETIC PEPTIDE: B Natriuretic Peptide: 1051.8 pg/mL — ABNORMAL HIGH (ref 0.0–100.0)

## 2023-06-28 LAB — PHOSPHORUS: Phosphorus: 2.2 mg/dL — ABNORMAL LOW (ref 2.5–4.6)

## 2023-06-28 LAB — CK: Total CK: 2152 U/L — ABNORMAL HIGH (ref 49–397)

## 2023-06-28 LAB — GLUCOSE, CAPILLARY: Glucose-Capillary: 155 mg/dL — ABNORMAL HIGH (ref 70–99)

## 2023-06-28 LAB — PROCALCITONIN: Procalcitonin: 8.29 ng/mL

## 2023-06-28 LAB — HEPARIN LEVEL (UNFRACTIONATED)
Heparin Unfractionated: 0.18 [IU]/mL — ABNORMAL LOW (ref 0.30–0.70)
Heparin Unfractionated: 0.3 [IU]/mL (ref 0.30–0.70)

## 2023-06-28 MED ORDER — K PHOS MONO-SOD PHOS DI & MONO 155-852-130 MG PO TABS
500.0000 mg | ORAL_TABLET | Freq: Four times a day (QID) | ORAL | Status: AC
Start: 2023-06-28 — End: 2023-06-28
  Administered 2023-06-28 (×3): 500 mg via ORAL
  Filled 2023-06-28 (×3): qty 2

## 2023-06-28 MED ORDER — MAGNESIUM SULFATE 2 GM/50ML IV SOLN
2.0000 g | Freq: Once | INTRAVENOUS | Status: AC
  Administered 2023-06-28: 2 g via INTRAVENOUS
  Filled 2023-06-28: qty 50

## 2023-06-28 MED ORDER — HEPARIN SODIUM (PORCINE) 5000 UNIT/ML IJ SOLN
5000.0000 [IU] | Freq: Three times a day (TID) | INTRAMUSCULAR | Status: DC
  Administered 2023-06-29 – 2023-07-02 (×9): 5000 [IU] via SUBCUTANEOUS
  Filled 2023-06-28 (×9): qty 1

## 2023-06-28 MED ORDER — POTASSIUM CHLORIDE CRYS ER 20 MEQ PO TBCR
40.0000 meq | EXTENDED_RELEASE_TABLET | Freq: Four times a day (QID) | ORAL | Status: AC
Start: 2023-06-28 — End: 2023-06-28
  Administered 2023-06-28 (×2): 40 meq via ORAL
  Filled 2023-06-28 (×2): qty 2

## 2023-06-28 NOTE — Evaluation (Signed)
 Physical Therapy Evaluation Patient Details Name: Matthew Mckay MRN: [id number] DOB: November 28, 1940 Today's Date: 06/28/2023  History of Present Illness  Pt is an 83 y/o M presenting to ED on 3/10 after fall, down x4 hours admitted for sepsis 2/2 leukocytosis, tachycardia, UTI, also with rhabdomyolysis. PMH includes GERD, HTN, HLD, prostate CA, A fib, DDD, BPH  Clinical Impression  Patient received in recliner. Daughter present in room. Patient is agreeable to PT assessment. Upon standing patient found to be soiled and continuing to have loose BM upon standing. Patient ambulated to bathroom with RW and min A. After getting cleaned up, changed, patient assisted back to recliner with single Hand held assist. He will continue to benefit from skilled PT to improve functional independence and safety with mobility.           If plan is discharge home, recommend the following: A little help with walking and/or transfers;A little help with bathing/dressing/bathroom;Assist for transportation;Help with stairs or ramp for entrance;Assistance with cooking/housework   Can travel by private vehicle    yes    Equipment Recommendations Rolling walker (2 wheels)  Recommendations for Other Services       Functional Status Assessment Patient has had a recent decline in their functional status and demonstrates the ability to make significant improvements in function in a reasonable and predictable amount of time.     Precautions / Restrictions Precautions Precautions: Fall Recall of Precautions/Restrictions: Intact Restrictions Weight Bearing Restrictions Per Provider Order: No      Mobility  Bed Mobility               General bed mobility comments: Patient received in recliner    Transfers Overall transfer level: Needs assistance Equipment used: Rolling walker (2 wheels) Transfers: Sit to/from Stand Sit to Stand: Min assist                Ambulation/Gait Ambulation/Gait  assistance: Contact guard assist, Min assist Gait Distance (Feet): 25 Feet Assistive device: Rolling walker (2 wheels), 1 person hand held assist Gait Pattern/deviations: Step-through pattern, Decreased step length - right, Decreased step length - left, Decreased stride length Gait velocity: decr     General Gait Details: stood and was soiled, proceeded to have loose BM all the way to bathroom. Ambulated to bathroom with RW, back to recliner with hand held assist.  Stairs            Wheelchair Mobility     Tilt Bed    Modified Rankin (Stroke Patients Only)       Balance Overall balance assessment: Needs assistance Sitting-balance support: Feet supported Sitting balance-Leahy Scale: Fair     Standing balance support: Single extremity supported, Bilateral upper extremity supported, During functional activity Standing balance-Leahy Scale: Fair Standing balance comment: min A for ambulation. He is tremulous throughout. Would benefit from RW, needs further instruction                             Pertinent Vitals/Pain Pain Assessment Pain Assessment: Faces Faces Pain Scale: Hurts little more Pain Location: Back of legs Pain Descriptors / Indicators: Discomfort Pain Intervention(s): Monitored during session, Repositioned    Home Living Family/patient expects to be discharged to:: Private residence Living Arrangements: Alone Available Help at Discharge: Family;Available 24 hours/day Type of Home: House Home Access: Stairs to enter Entrance Stairs-Rails: Left Entrance Stairs-Number of Steps: 5   Home Layout: One level Home Equipment: Cane - single point  Prior Function Prior Level of Function : Independent/Modified Independent             Mobility Comments: ind, here for fall ADLs Comments: ind, however daughter comes over to assist at home     Extremity/Trunk Assessment   Upper Extremity Assessment Upper Extremity Assessment: Defer to OT  evaluation    Lower Extremity Assessment Lower Extremity Assessment: Generalized weakness       Communication   Communication Communication: No apparent difficulties    Cognition Arousal: Alert Behavior During Therapy: WFL for tasks assessed/performed   PT - Cognitive impairments: No apparent impairments                         Following commands: Intact       Cueing Cueing Techniques: Verbal cues, Gestural cues     General Comments      Exercises     Assessment/Plan    PT Assessment Patient needs continued PT services  PT Problem List Decreased strength;Decreased activity tolerance;Decreased balance;Decreased mobility;Decreased knowledge of use of DME;Pain       PT Treatment Interventions DME instruction;Gait training;Stair training;Functional mobility training;Therapeutic activities;Therapeutic exercise;Balance training;Neuromuscular re-education;Patient/family education    PT Goals (Current goals can be found in the Care Plan section)  Acute Rehab PT Goals Patient Stated Goal: to return home PT Goal Formulation: With patient/family Time For Goal Achievement: 07/05/23 Potential to Achieve Goals: Good    Frequency Min 1X/week     Co-evaluation               AM-PAC PT "6 Clicks" Mobility  Outcome Measure Help needed turning from your back to your side while in a flat bed without using bedrails?: A Little Help needed moving from lying on your back to sitting on the side of a flat bed without using bedrails?: A Little Help needed moving to and from a bed to a chair (including a wheelchair)?: A Little Help needed standing up from a chair using your arms (e.g., wheelchair or bedside chair)?: A Little Help needed to walk in hospital room?: A Little Help needed climbing 3-5 steps with a railing? : Total 6 Click Score: 16    End of Session Equipment Utilized During Treatment: Gait belt Activity Tolerance: Patient tolerated treatment  well Patient left: in chair;with call bell/phone within reach;with family/visitor present Nurse Communication: Mobility status;Other (comment) (IV to be hooked back up, external catheter needs replaced) PT Visit Diagnosis: Unsteadiness on feet (R26.81);Other abnormalities of gait and mobility (R26.89);Muscle weakness (generalized) (M62.81);Difficulty in walking, not elsewhere classified (R26.2);History of falling (Z91.81)    Time: 1610-9604 PT Time Calculation (min) (ACUTE ONLY): 38 min   Charges:   PT Evaluation $PT Eval Moderate Complexity: 1 Mod PT Treatments $Gait Training: 8-22 mins $Therapeutic Activity: 8-22 mins PT General Charges $$ ACUTE PT VISIT: 1 Visit         Tyshauna Finkbiner, PT, GCS 06/28/23,3:40 PM

## 2023-06-28 NOTE — Progress Notes (Signed)
 PROGRESS NOTE    Matthew Mckay  ZOX:[id number] DOB: 14-Aug-1940 DOA: 06/27/2023 PCP: Jac Canavan, PA-C    Chief Complaint  Patient presents with   Fall    Brief Narrative:   Matthew Mckay is a 83 y.o. male with medical history significant of GERD, HTN, HLD, hx of prostate cancer, atrial fibrillation, DDD, BPH with hx of urinary retention who presented to ED after a fall. He tells me he was up in bed changing his diaper and had bad muscle spasms in his calves that caused him to fall. He was stuck on the ground for about 4 hours. His daughter got worried when he didn't answer his phone and came over. She was unable to get him off the ground and called EMS. He endorses chills, denies any fevers. States he had some dysuria that recently started and always has urinary frequency. NO CVA tenderness    Daughter tells me he  may have fallen around 7AM and she got to this house around 11AM. She lives across the street from him. She tells me he has been sick all weekend. He had some constipation and took miralax and had a BM. On Friday he complained that his rectum was hurting because he kept going to the bathroom. He also took  He also had some urinary retention.    Denies any chills, vision changes/headaches, chest pain or palpitations, shortness of breath or cough, abdominal pain, N/V/D or leg swelling.    He does not smoke or drink alcohol.    ER Course:  vitals: temp: 102.3, bp: 141/85, HR: 92, RR: 17, oxygen: 97%RA Pertinent labs: wbc: 17.9, CK: 1051, lactic acid: 2.4, troponin 90 CXR: no acute finding CT head: no acute finding. Chronic right cerebellar infarct. Chronic, fluid attenuation left frontal hygroma measuring 0.7 cm in thickness. No acute hemorrhage. 3. Soft tissue contusion of the left forehead. CT cervical spine: No fracture or static subluxation of the cervical spine. Mild-to-moderate multilevel cervical disc degenerative disease, worst from C5-C7. In ED: code sepsis.  Given 1L IVF and started on IVF. Given rocephin, tylenol and toprol. BC obtained. TRH asked to admit.      Assessment & Plan:   Principal Problem:   Sepsis (HCC) Active Problems:   Rhabdomyolysis   Elevated troponin   Atrial fibrillation (HCC)   Essential hypertension   GERD (gastroesophageal reflux disease)   Hyperlipidemia   History of prostate cancer   Tremor  Sepsis (HCC)/secondary to UTI with cystitis and bilateral perinephritis -Sepsis present on admission, febrile, tachypneic, tachycardic, with elevated lactic acid and leukocytosis -Continue with IV Rocephin. -Continue with IV fluids -Follow-up blood cultures and urine cultures, adjust antibiotics as needed -Continue to trend procalcitonin and white blood cell count. -With known history of prostate cancer, hematologic-radiation cystitis, as discussed with granddaughter it does appear it is a recurrent problem, will await cultures and discussed with urology if it is appropriate to start on suppressive antibiotic therapy    Rhabdomyolysis On fall x 4 hours or so, appears mild will trend Continue with IV fluid at total CK still trending up Hold lipitor     Elevated troponin Troponin flat, no chest pain Likely demand ischemia in setting of sepsis/afib  Continue tele monitoring    Atrial fibrillation (HCC) CHADS2VASC score of 3.  S/p DCCV on 09/02/22  Family stopped eliquis in November due to gross hematuria  Discussed with family, daughter, granddaughter Dr. Fonda Kinder, unsteady, with multiple falls, recurrent hemorrhagic cystitis, risks outweighed benefits,  stop anticoagulation.   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 -recommend follow-up MRI in 2 years per radiology.    DVT prophylaxis: Will DC heparin drip and start subcu heparin Code Status: (Full/Partial - specify details) Family Communication: Discussed with daughter at bedside, and granddaughter Dr. Fonda Kinder by  phone [phone] Disposition:   Status is: Inpatient    Consultants:  none   Subjective:  Tmax 101.5 this morning, reports appetite has improved  Objective: Vitals:   06/27/23 1803 06/27/23 1848 06/28/23 0758 06/28/23 1053  BP:  109/79 (!) 151/72   Pulse:  74    Resp:  18    Temp: 98.3 F (36.8 C) 99 F (37.2 C) (!) 101.5 F (38.6 C) 99.6 F (37.6 C)  TempSrc: Oral Oral Oral Oral  SpO2:  95%    Weight:      Height:        Intake/Output Summary (Last 24 hours) at 06/28/2023 1332 Last data filed at 06/27/2023 1731 Gross per 24 hour  Intake 410.12 ml  Output --  Net 410.12 ml   Filed Weights   06/27/23 1221  Weight: 74.8 kg    Examination:  Awake Alert, Oriented X 3, frail Symmetrical Chest wall movement, diminished at the bases RRR,No Gallops,Rubs or new Murmurs, No Parasternal Heave +ve B.Sounds, Abd Soft, No tenderness, No rebound - guarding or rigidity. No Cyanosis, Clubbing or edema, No new Rash or bruise      Data Reviewed: I have personally reviewed following labs and imaging studies  CBC: Recent Labs  Lab 06/27/23 1217 06/27/23 1226 06/28/23 0444  WBC 17.9*  --  21.4*  HGB 13.8 14.3 12.5*  HCT 42.8 42.0 38.4*  MCV 91.8  --  91.0  PLT 165  --  161    Basic Metabolic Panel: Recent Labs  Lab 06/27/23 1217 06/27/23 1226 06/27/23 1515 06/28/23 0444  NA 140 141  --  140   K 3.5 3.7  --  3.4*  CL 108 109  --  112*  CO2 22  --   --  20*  GLUCOSE 186* 175*  --  147*  BUN 31* 39*  --  31*  CREATININE 1.23 1.10  --  1.14  CALCIUM 9.4  --   --  8.3*  MG 1.8  --   --  1.7  PHOS  --   --  2.4* 2.2*    GFR: Estimated Creatinine Clearance: 48.2 mL/min (by C-G formula based on SCr of 1.14 mg/dL).  Liver Function Tests: Recent Labs  Lab 06/27/23 1217 06/28/23 0444  AST 32 69*  ALT 14 26  ALKPHOS 34* 34*  BILITOT 1.0 0.7  PROT 6.5 5.8*  ALBUMIN 2.7* 2.3*    CBG: Recent Labs  Lab 06/28/23 0756  GLUCAP 155*     Recent Results (from the past 240 hours)  Resp panel by RT-PCR (RSV, Flu A&B, Covid) Anterior Nasal Swab  Status: None   Collection Time: 06/27/23 12:21 PM   Specimen: Anterior Nasal Swab  Result Value Ref Range Status   SARS Coronavirus 2 by RT PCR NEGATIVE NEGATIVE Final   Influenza A by PCR NEGATIVE NEGATIVE Final   Influenza B by PCR NEGATIVE NEGATIVE Final    Comment: (NOTE) The Xpert Xpress SARS-CoV-2/FLU/RSV plus assay is intended as an aid in the diagnosis of influenza from Nasopharyngeal swab specimens and should not be used as a sole basis for treatment. Nasal washings and aspirates are unacceptable for Xpert Xpress SARS-CoV-2/FLU/RSV testing.  Fact Sheet for Patients: BloggerCourse.com  Fact Sheet for Healthcare Providers: SeriousBroker.it  This test is not yet approved or cleared by the [place] FDA and has been authorized for detection and/or diagnosis of SARS-CoV-2 by FDA under an Emergency Use Authorization (EUA). This EUA will remain in effect (meaning this test can be used) for the duration of the COVID-19 declaration under Section 564(b)(1) of the Act, 21 U.S.C. section 360bbb-3(b)(1), unless the authorization is terminated or revoked.     Resp Syncytial Virus by PCR NEGATIVE NEGATIVE Final    Comment: (NOTE) Fact Sheet for  Patients: BloggerCourse.com  Fact Sheet for Healthcare Providers: SeriousBroker.it  This test is not yet approved or cleared by the [place] FDA and has been authorized for detection and/or diagnosis of SARS-CoV-2 by FDA under an Emergency Use Authorization (EUA). This EUA will remain in effect (meaning this test can be used) for the duration of the COVID-19 declaration under Section 564(b)(1) of the Act, 21 U.S.C. section 360bbb-3(b)(1), unless the authorization is terminated or revoked.  Performed at [facility], [address], [place], [place] [address]   Blood Culture (routine x 2)     Status: None (Preliminary result)   Collection Time: 06/27/23  2:32 PM   Specimen: BLOOD  Result Value Ref Range Status   Specimen Description BLOOD SITE NOT SPECIFIED  Final   Special Requests   Final    BOTTLES DRAWN AEROBIC ONLY Blood Culture results may not be optimal due to an inadequate volume of blood received in culture bottles   Culture   Final    NO GROWTH < 24 HOURS Performed at [facility], [address] [place], [place] [address]    Report Status PENDING  Incomplete  Blood Culture (routine x 2)     Status: None (Preliminary result)   Collection Time: 06/27/23  2:37 PM   Specimen: BLOOD  Result Value Ref Range Status   Specimen Description BLOOD SITE NOT SPECIFIED  Final   Special Requests   Final    BOTTLES DRAWN AEROBIC ONLY Blood Culture results may not be optimal due to an inadequate volume of blood received in culture bottles   Culture   Final    NO GROWTH < 24 HOURS Performed at [facility], [address] [place], [place] [address]    Report Status PENDING  Incomplete         Radiology Studies: CT ABDOMEN PELVIS W CONTRAST Result Date: 06/27/2023 CLINICAL DATA:  Acute abdominal pain EXAM: CT ABDOMEN AND PELVIS WITH CONTRAST TECHNIQUE: Multidetector CT imaging of the abdomen and pelvis  was performed using the standard protocol following bolus administration of intravenous contrast. RADIATION DOSE REDUCTION: This exam was performed according to the departmental dose-optimization program which includes automated exposure control, adjustment of the mA and/or kV according to patient size and/or use of iterative reconstruction technique. CONTRAST:  75mL OMNIPAQUE  IOHEXOL 350 MG/ML SOLN COMPARISON:  CT abdomen and pelvis 03/16/2023. FINDINGS: Lower chest: No acute abnormality. Hepatobiliary: No focal liver abnormality is seen. Status post cholecystectomy. No biliary dilatation. Pancreas: There are scattered hypodensities throughout the pancreas measuring 1 cm or less. There is mild diffuse pancreatic atrophy. There is no acute inflammation or ductal dilatation. Findings are similar to prior. Spleen: Normal in size without focal abnormality. Adrenals/Urinary Tract: There are ill-defined patchy areas of cortical hypodensity throughout both kidneys with bilateral perinephric fat stranding, right greater than left. There is no hydronephrosis. Bilateral renal cysts are similar to the prior examination measuring up to 5.5 cm. The adrenal glands and bladder are within normal limits. Stomach/Bowel: Stomach is within normal limits. Appendix appears normal. No evidence of bowel wall thickening, distention, or inflammatory changes. There is sigmoid colon diverticulosis. Vascular/Lymphatic: Aortic atherosclerosis. No enlarged abdominal or pelvic lymph nodes. Reproductive: TURP defect in the prostate gland. Other: Mild presacral edema. Small fat containing umbilical hernias. Musculoskeletal: T12-L1 chronic compression deformities are unchanged. IMPRESSION: 1. Findings compatible with bilateral pyelonephritis. No hydronephrosis. 2. Stable bilateral renal cysts. 3. 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 Recommend follow-up MRI in 2 years. 4. Sigmoid colon diverticulosis. 5. Aortic atherosclerosis.  Aortic Atherosclerosis (ICD10-I70.0). Electronically Signed   By: Darliss Cheney M.D.   On: 06/27/2023 20:31   CT HEAD WO CONTRAST Result Date: 06/27/2023 CLINICAL DATA:  Head trauma, fall, on blood thinners EXAM: CT HEAD WITHOUT CONTRAST CT CERVICAL SPINE WITHOUT CONTRAST TECHNIQUE: Multidetector CT imaging of the head and cervical spine was performed following the standard protocol without intravenous contrast. Multiplanar CT image reconstructions of the cervical spine were also generated. RADIATION DOSE REDUCTION: This exam was performed according to the departmental dose-optimization program which includes automated exposure control, adjustment of the mA and/or kV according to patient size and/or use of iterative reconstruction technique. COMPARISON:  CT cervical spine, 10/14/2019 FINDINGS: CT HEAD FINDINGS Brain: No evidence of acute infarction, acute hemorrhage, hydrocephalus, or mass lesion/mass effect. Chronic, fluid attenuation left frontal hygroma measuring 0.7 cm in thickness (series 2, image 19, series 4, image 51). Encephalomalacia of the right cerebellar hemisphere (series 2, image 25). Periventricular white matter hypodensity. Vascular: No hyperdense vessel or unexpected calcification. Skull: Normal. Negative for fracture or focal lesion. Sinuses/Orbits: No acute finding. Other: Soft tissue contusion of the left forehead (series 2, image 15). CT CERVICAL SPINE FINDINGS Alignment: Degenerative straightening of the normal cervical lordosis. Skull base and vertebrae: No acute fracture. No primary bone lesion or focal pathologic process. Soft tissues and spinal canal: No prevertebral fluid or swelling. No visible canal hematoma. Disc levels: Mild moderate multilevel cervical disc degenerative disease, worst from C5-C7. Upper chest: Negative. Other: None. IMPRESSION: 1. No acute intracranial pathology. 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 2. Chronic, fluid attenuation  left frontal hygroma measuring 0.7 cm in thickness. No acute hemorrhage. 3. Soft tissue contusion of the left forehead. 4. No fracture or static subluxation of the cervical spine. 5. Mild-to-moderate multilevel cervical disc degenerative disease, worst from C5-C7. Electronically Signed   By: Jearld Lesch M.D.   On: 06/27/2023 13:22   CT CERVICAL SPINE WO CONTRAST Result Date: 06/27/2023 CLINICAL DATA:  Head trauma, fall, on blood thinners EXAM: CT HEAD WITHOUT CONTRAST CT CERVICAL SPINE WITHOUT CONTRAST TECHNIQUE: Multidetector CT imaging of the head and cervical spine was performed following the standard protocol without intravenous contrast. Multiplanar CT image reconstructions of the cervical spine were also generated. RADIATION  DOSE REDUCTION: This exam was performed according to the departmental dose-optimization program which includes automated exposure control, adjustment of the mA and/or kV according to patient size and/or use of iterative reconstruction technique. COMPARISON:  CT cervical spine, 10/14/2019 FINDINGS: CT HEAD FINDINGS Brain: No evidence of acute infarction, acute hemorrhage, hydrocephalus, or mass lesion/mass effect. Chronic, fluid attenuation left frontal hygroma measuring 0.7 cm in thickness (series 2, image 19, series 4, image 51). Encephalomalacia of the right cerebellar hemisphere (series 2, image 25). Periventricular white matter hypodensity. Vascular: No hyperdense vessel or unexpected calcification. Skull: Normal. Negative for fracture or focal lesion. Sinuses/Orbits: No acute finding. Other: Soft tissue contusion of the left forehead (series 2, image 15). CT CERVICAL SPINE FINDINGS Alignment: Degenerative straightening of the normal cervical lordosis. Skull base and vertebrae: No acute fracture. No primary bone lesion or focal pathologic process. Soft tissues and spinal canal: No prevertebral fluid or swelling. No visible canal hematoma. Disc levels: Mild moderate multilevel  cervical disc degenerative disease, worst from C5-C7. Upper chest: Negative. Other: None. IMPRESSION: 1. No acute intracranial pathology. 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 2. Chronic, fluid attenuation left frontal hygroma measuring 0.7 cm in thickness. No acute hemorrhage. 3. Soft tissue contusion of the left forehead. 4. No fracture or static subluxation of the cervical spine. 5. Mild-to-moderate multilevel cervical disc degenerative disease, worst from C5-C7. Electronically Signed   By: Jearld Lesch M.D.   On: 06/27/2023 13:22   DG Chest Portable 1 View Result Date: 06/27/2023 CLINICAL DATA:  Fall.  Cough EXAM: PORTABLE CHEST 1 VIEW COMPARISON:  X-ray 07/23/2022 and older FINDINGS: Underinflation. Enlarged cardiopericardial silhouette. Calcified aorta. No pneumothorax, effusion or edema. Overlapping cardiac leads. Degenerative changes along the spine. IMPRESSION: Underinflation.  No acute cardiopulmonary disease. Electronically Signed   By: Karen Kays M.D.   On: 06/27/2023 12:34        Scheduled Meds:  metoprolol succinate  25 mg Oral Daily   phosphorus  500 mg Oral Q6H   potassium chloride  40 mEq Oral Q6H   Continuous Infusions:  sodium chloride 100 mL/hr at 06/28/23 1056   cefTRIAXone (ROCEPHIN)  IV 2 g (06/28/23 1059)   heparin 1,250 Units/hr (06/28/23 1330)     LOS: 1 day      Huey Bienenstock, MD [facility]   To contact the attending provider between 7A-7P or the covering provider during after hours 7P-7A, please log into the web site [email or web address] and access using universal [place] password for that web site. If you do not have the password, please call the hospital operator.  06/28/2023, 1:32 PM

## 2023-06-28 NOTE — Plan of Care (Signed)

## 2023-06-28 NOTE — Evaluation (Signed)
 Occupational Therapy Evaluation Patient Details Name: Matthew Mckay MRN: [id number] DOB: August 17, 1940 Today's Date: 06/28/2023   History of Present Illness   Pt is an 83 y/o M presenting to ED on 3/10 after fall, down x4 hours admitted for sepsis 2/2 leukocytosis, tachycardia, UTI, also with rhabdomyolysis. PMH includes GERD, HTN, HLD, prostate CA, A fib, DDD, BPH     Clinical Impressions Pt reports ind at baseline with ADLs and functional mobility, lives alone but reports his daughter lives across the street and lives PRN. Pt currently with BUE/BLE tremors and R lateral lean in sitting. Pt currently needing up to max A for ADLs, mod A for bed mobility and mod A for transfers with RW. Pt presenting with impairments listed below, will follow acutely. Patient will benefit from continued inpatient follow up therapy, <3 hours/day to maximize safety/ind with ADLs and functional mobility.      If plan is discharge home, recommend the following:   A lot of help with walking and/or transfers;A lot of help with bathing/dressing/bathroom;Assistance with cooking/housework;Assistance with feeding;Assist for transportation;Direct supervision/assist for financial management;Direct supervision/assist for medications management;Help with stairs or ramp for entrance     Functional Status Assessment   Patient has had a recent decline in their functional status and demonstrates the ability to make significant improvements in function in a reasonable and predictable amount of time.     Equipment Recommendations   Tub/shower seat;Other (comment);BSC/3in1 (RW)     Recommendations for Other Services   PT consult     Precautions/Restrictions   Precautions Precautions: Fall Restrictions Weight Bearing Restrictions Per Provider Order: No     Mobility Bed Mobility Overal bed mobility: Needs Assistance Bed Mobility: Supine to Sit     Supine to sit: Mod assist     General bed mobility  comments: decr initiation, minimally moving BLEs to EOB    Transfers Overall transfer level: Needs assistance Equipment used: Rolling walker (2 wheels) Transfers: Bed to chair/wheelchair/BSC, Sit to/from Stand Sit to Stand: Mod assist     Step pivot transfers: Mod assist            Balance Overall balance assessment: Needs assistance Sitting-balance support: Bilateral upper extremity supported, Feet supported Sitting balance-Leahy Scale: Fair Sitting balance - Comments: unsupported but leans to R   Standing balance support: During functional activity, Reliant on assistive device for balance Standing balance-Leahy Scale: Poor Standing balance comment: RW and mod A for transfer                           ADL either performed or assessed with clinical judgement   ADL Overall ADL's : Needs assistance/impaired Eating/Feeding: Minimal assistance Eating/Feeding Details (indicate cue type and reason): due to tremors Grooming: Minimal assistance   Upper Body Bathing: Moderate assistance   Lower Body Bathing: Maximal assistance;Sitting/lateral leans   Upper Body Dressing : Moderate assistance;Sitting   Lower Body Dressing: Maximal assistance;Sitting/lateral leans   Toilet Transfer: Moderate assistance;Stand-pivot;BSC/3in1;Rolling walker (2 wheels)   Toileting- Clothing Manipulation and Hygiene: Maximal assistance       Functional mobility during ADLs: Moderate assistance;Rolling walker (2 wheels)       Vision   Vision Assessment?: No apparent visual deficits     Perception Perception: Not tested       Praxis Praxis: Not tested       Pertinent Vitals/Pain Pain Assessment Pain Assessment: No/denies pain     Extremity/Trunk Assessment Upper Extremity Assessment Upper Extremity  Assessment: Generalized weakness (BUE tremoring)   Lower Extremity Assessment Lower Extremity Assessment: Defer to PT evaluation   Cervical / Trunk Assessment Cervical /  Trunk Assessment: Kyphotic;Other exceptions (R lateral lean)   Communication Communication Communication: No apparent difficulties   Cognition Arousal: Alert Behavior During Therapy: WFL for tasks assessed/performed Cognition: Cognition impaired     Awareness: Online awareness impaired Memory impairment (select all impairments): Short-term memory, Working memory Attention impairment (select first level of impairment): Selective attention, Alternating attention, Divided attention Executive functioning impairment (select all impairments): Initiation OT - Cognition Comments: incr time and multiple cues to initiate bed mobility tasks, aware of why he is in the hospital and date                 Following commands: Intact (with cues)       Cueing  General Comments   Cueing Techniques: Verbal cues;Gestural cues  VSS on 3L O2   Exercises     Shoulder Instructions      Home Living Family/patient expects to be discharged to:: Private residence Living Arrangements: Alone (daughter lives across the street) Available Help at Discharge: Family;Available PRN/intermittently Type of Home: House Home Access: Stairs to enter [employer] of Steps: 6   Home Layout: One level     Bathroom Shower/Tub: Walk-in shower (reports standing for showers)         Home Equipment: None          Prior Functioning/Environment Prior Level of Function : Independent/Modified Independent             Mobility Comments: ind ADLs Comments: ind, however daughter comes over to assist at home    OT Problem List: Decreased range of motion;Decreased strength;Decreased activity tolerance;Impaired balance (sitting and/or standing);Decreased cognition;Decreased safety awareness;Cardiopulmonary status limiting activity   OT Treatment/Interventions: Self-care/ADL training;Therapeutic exercise;Energy conservation;DME and/or AE instruction;Therapeutic activities;Cognitive  remediation/compensation;Patient/family education;Balance training;Neuromuscular education      OT Goals(Current goals can be found in the care plan section)   Acute Rehab OT Goals Patient Stated Goal: did not state OT Goal Formulation: With patient Time For Goal Achievement: 07/12/23 Potential to Achieve Goals: Good ADL Goals Pt Will Perform Upper Body Dressing: with min assist;sitting Pt Will Perform Lower Body Dressing: with min assist;sit to/from stand;sitting/lateral leans Pt Will Transfer to Toilet: with min assist;ambulating;regular height toilet Additional ADL Goal #1: pt will perform bed mobility with supervision in prep for OOB activity.   OT Frequency:  Min 1X/week    Co-evaluation              AM-PAC OT "6 Clicks" Daily Activity     Outcome Measure Help from another person eating meals?: A Little Help from another person taking care of personal grooming?: A Little Help from another person toileting, which includes using toliet, bedpan, or urinal?: A Lot Help from another person bathing (including washing, rinsing, drying)?: A Lot Help from another person to put on and taking off regular upper body clothing?: A Lot Help from another person to put on and taking off regular lower body clothing?: A Lot 6 Click Score: 14   End of Session Equipment Utilized During Treatment: Gait belt;Rolling walker (2 wheels);Oxygen Nurse Communication: Mobility status  Activity Tolerance: Patient tolerated treatment well Patient left: in chair;with call bell/phone within reach;with chair alarm set  OT Visit Diagnosis: Unsteadiness on feet (R26.81);Other abnormalities of gait and mobility (R26.89);Muscle weakness (generalized) (M62.81);History of falling (Z91.81);Other symptoms and signs involving the nervous system (R29.898)  Time: 1610-9604 OT Time Calculation (min): 29 min Charges:  OT General Charges $OT Visit: 1 Visit OT Evaluation $OT Eval Moderate  Complexity: 1 Mod OT Treatments $Self Care/Home Management : 8-22 mins  Carver Fila, OTD, OTR/L SecureChat Preferred Acute Rehab [phone]   Lenda Baratta K Koonce 06/28/2023, 9:30 AM

## 2023-06-28 NOTE — Progress Notes (Signed)
 PHARMACY - ANTICOAGULATION CONSULT NOTE  Pharmacy Consult for heparin  Indication: atrial fibrillation  Allergies  Allergen Reactions   Pollen Extract-Tree Extract [Pollen Extract]     Patient Measurements: Height: 5\' 9"  (175.3 cm) Weight: 74.8 kg (165 lb) IBW/kg (Calculated) : 70.7 Heparin Dosing Weight: 74.8kg   Vital Signs: Temp: 99 F (37.2 C) (03/09 1848) Temp Source: Oral (03/09 1848) BP: 109/79 (03/09 1848) Pulse Rate: 74 (03/09 1848)  Labs: Recent Labs    06/27/23 1217 06/27/23 1226 06/27/23 1515 06/28/23 0442 06/28/23 0444  HGB 13.8 14.3  --   --  12.5*  HCT 42.8 42.0  --   --  38.4*  PLT 165  --   --   --  161  LABPROT 16.6*  --   --   --   --   INR 1.3*  --   --   --   --   HEPARINUNFRC  --   --   --  0.18*  --   CREATININE 1.23 1.10  --   --  1.14  CKTOTAL 1,051*  --   --   --  2,152*  TROPONINIHS 90*  --  89*  --   --     Estimated Creatinine Clearance: 48.2 mL/min (by C-G formula based on SCr of 1.14 mg/dL).   Medical History: Past Medical History:  Diagnosis Date   Allergy    Anxiety    Chronic sinusitis    Diverticulosis    sigmoid colon and increased vascularization due to prior radiation, per 2013 colonoscopy   GERD (gastroesophageal reflux disease)    Hemorrhoid    Hyperlipidemia    Hypertension    Prostate cancer (HCC)    history of radiation therapy   Seasonal allergies    Urinary retention 2019   Assessment: Patient presenting following a fall, hx of Afib supposed to be on Eliquis PTA. Spoke with patient, he said he stopped it several months ago due to significant hematuria. He said that a provider did not instruct him to stop it. Hemoglobin 14.3 and PLTs 165. Pharmacy consulted to dose heparin gtt.   3/10 AM update:  Heparin level sub-therapeutic   Goal of Therapy:  Heparin level 0.3-0.7 units/ml Monitor platelets by anticoagulation protocol: Yes   Plan:  Inc heparin to 1250 units/hr Check anti-Xa level in 8 hours and  daily while on heparin.  Continue to monitor H&H and platelets  Abran Duke, PharmD, BCPS Clinical Pharmacist Phone: [phone]

## 2023-06-28 NOTE — Telephone Encounter (Signed)
 FYI

## 2023-06-28 NOTE — Telephone Encounter (Signed)
 Copied from CRM [id number]. Topic: Clinical - Medical Advice >> Jun 28, 2023 12:04 PM Ivette P wrote: Reason for CRM: back and A-fib and kdney infection and Uti, pt is in hospital and will be in a couple days at [facility]. Pt wanted to give ntoice to primary. Pt fell on 03/09 - 7:30am,  no bleeding.    Fuller Song daughter callback - [phone]

## 2023-06-28 NOTE — Plan of Care (Signed)
 progressing

## 2023-06-29 ENCOUNTER — Encounter (HOSPITAL_COMMUNITY): Payer: Self-pay | Admitting: Family Medicine

## 2023-06-29 DIAGNOSIS — R338 Other retention of urine: Secondary | ICD-10-CM | POA: Diagnosis not present

## 2023-06-29 DIAGNOSIS — N35919 Unspecified urethral stricture, male, unspecified site: Secondary | ICD-10-CM

## 2023-06-29 DIAGNOSIS — N35811 Other urethral stricture, male, meatal: Secondary | ICD-10-CM

## 2023-06-29 DIAGNOSIS — R339 Retention of urine, unspecified: Secondary | ICD-10-CM

## 2023-06-29 DIAGNOSIS — A419 Sepsis, unspecified organism: Secondary | ICD-10-CM | POA: Diagnosis not present

## 2023-06-29 DIAGNOSIS — N390 Urinary tract infection, site not specified: Secondary | ICD-10-CM

## 2023-06-29 DIAGNOSIS — Z8546 Personal history of malignant neoplasm of prostate: Secondary | ICD-10-CM

## 2023-06-29 LAB — BASIC METABOLIC PANEL
Anion gap: 7 (ref 5–15)
BUN: 28 mg/dL — ABNORMAL HIGH (ref 8–23)
CO2: 23 mmol/L (ref 22–32)
Calcium: 8.2 mg/dL — ABNORMAL LOW (ref 8.9–10.3)
Chloride: 111 mmol/L (ref 98–111)
Creatinine, Ser: 0.88 mg/dL (ref 0.61–1.24)
GFR, Estimated: >60 mL/min (ref >60)
Glucose, Bld: 135 mg/dL — ABNORMAL HIGH (ref 70–99)
Potassium: 3.8 mmol/L (ref 3.5–5.1)
Sodium: 141 mmol/L (ref 135–145)

## 2023-06-29 LAB — CBC
HCT: 35.6 % — ABNORMAL LOW (ref 39.0–52.0)
Hemoglobin: 11.9 g/dL — ABNORMAL LOW (ref 13.0–17.0)
MCH: 29.3 pg (ref 26.0–34.0)
MCHC: 33.4 g/dL (ref 30.0–36.0)
MCV: 87.7 fL (ref 80.0–100.0)
Platelets: 164 10*3/uL (ref 150–400)
RBC: 4.06 MIL/uL — ABNORMAL LOW (ref 4.22–5.81)
RDW: 13.9 % (ref 11.5–15.5)
WBC: 16.5 10*3/uL — ABNORMAL HIGH (ref 4.0–10.5)
nRBC: 0 % (ref 0.0–0.2)

## 2023-06-29 LAB — CK: Total CK: 1908 U/L — ABNORMAL HIGH (ref 49–397)

## 2023-06-29 LAB — PROCALCITONIN: Procalcitonin: 7.12 ng/mL

## 2023-06-29 LAB — PHOSPHORUS: Phosphorus: 2.7 mg/dL (ref 2.5–4.6)

## 2023-06-29 LAB — MAGNESIUM: Magnesium: 2.1 mg/dL (ref 1.7–2.4)

## 2023-06-29 LAB — BRAIN NATRIURETIC PEPTIDE: B Natriuretic Peptide: 718.3 pg/mL — ABNORMAL HIGH (ref 0.0–100.0)

## 2023-06-29 MED ORDER — ENSURE ENLIVE PO LIQD
237.0000 mL | Freq: Two times a day (BID) | ORAL | Status: DC
  Administered 2023-06-29 – 2023-07-02 (×6): 237 mL via ORAL

## 2023-06-29 MED ORDER — TRAZODONE HCL 50 MG PO TABS
25.0000 mg | ORAL_TABLET | Freq: Once | ORAL | Status: AC
  Administered 2023-06-30: 25 mg via ORAL
  Filled 2023-06-29: qty 1

## 2023-06-29 MED ORDER — POLYVINYL ALCOHOL 1.4 % OP SOLN
2.0000 [drp] | OPHTHALMIC | Status: AC | PRN
Start: 2023-06-29 — End: 2023-07-02
  Administered 2023-07-01 – 2023-07-02 (×2): 2 [drp] via OPHTHALMIC
  Filled 2023-06-29: qty 15

## 2023-06-29 NOTE — TOC CM/SW Note (Signed)
 Transition of Care [facility]) - Inpatient Brief Assessment   Patient Details  Name: Matthew Mckay MRN: [id number] Date of Birth: December 08, 1940  [facility]) CM/SW Contact:    Mearl Latin, LCSW Phone Number: 06/29/2023, 4:31 PM   Clinical Narrative: Patient admitted from home with fall. TOC following for home health consult.    Transition of Care Asessment: Insurance and Status: Insurance coverage has been reviewed Patient has primary care physician: Yes Home environment has been reviewed: From home Prior level of function:: Independent Prior/Current Home Services: No current home services Social Drivers of Health Review: SDOH reviewed no interventions necessary Readmission risk has been reviewed: Yes Transition of care needs: transition of care needs identified, TOC will continue to follow

## 2023-06-29 NOTE — Consult Note (Signed)
 Urology Consult   Physician requesting consult: Huey Bienenstock, MD  Reason for consult: Urinary retention, UTI with sepsis  History of Present Illness: Matthew Mckay is a 83 y.o. male seen in consultation from Dr.Elgergawy for evaluation of urinary retention.  He has a complex urologic history and is followed by Dr. Berneice Heinrich.  He has a history of prostate cancer and is status post a open prostatectomy in 1995 followed by salvage radiation therapy.  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  He has required balloon dilation in 2019 and again in 2020 due to significant urethral stricture disease.  He recently had an episode of gross hematuria.  He underwent evaluation with CT imaging which was unremarkable.  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  PVR at that time was 150 mL. He resented to the emergency room on 06/27/2023 after a fall.  On presentation he was noted to have a UTI with sepsis.  He was started on IV Rocephin.  Urinalysis on admission showed 21-50 RBCs, >50 WBCs, many bacteria.  Urine culture grew multiple species.  He apparently has been voiding spontaneously since his admission.  Bladder scan from earlier this morning demonstrated 446 mL.  An attempt was made by the nursing staff at intermittent catheterization without success. He has been voiding small amounts of urine into PureWick.  PVR's in 400-500 ml range. Urologic consultation was requested for management of urinary retention.   Past Medical History:  Diagnosis Date   Allergy    Anxiety    Chronic sinusitis    Diverticulosis    sigmoid colon and increased vascularization due to prior radiation, per 2013 colonoscopy   GERD (gastroesophageal reflux disease)    Hemorrhoid    Hyperlipidemia    Hypertension    Prostate cancer (HCC)    history of radiation therapy   Seasonal allergies    Urinary retention 2019    Past Surgical History:  Procedure  Laterality Date   CARDIOVERSION N/A 09/02/2022   Procedure: CARDIOVERSION;  Surgeon: Maisie Fus, MD;  Location: [facility];  Service: Cardiovascular;  Laterality: N/A;   COLONOSCOPY  02/2012   sigmoid diverticulosis, polyps, Dr. Vida Rigger   CYSTOSCOPY WITH URETHRAL DILATATION N/A 02/10/2019   Procedure: CYSTOSCOPY WITH URETHRAL DILATATION,;  Surgeon: Sebastian Ache, MD;  Location: [facility];  Service: Urology;  Laterality: N/A;   PROSTATECTOMY     1990s    Medications:  Scheduled Meds:  feeding supplement  237 mL Oral BID BM   heparin injection (subcutaneous)  5,000 Units Subcutaneous Q8H   metoprolol succinate  25 mg Oral Daily   Continuous Infusions:  cefTRIAXone (ROCEPHIN)  IV 2 g (06/29/23 1048)   PRN Meds:.acetaminophen **OR** acetaminophen, ondansetron **OR** ondansetron (ZOFRAN) IV  Allergies:  Allergies  Allergen Reactions   Pollen Extract-Tree Extract [Pollen Extract]     Family History  Problem Relation Age of Onset   Cancer Brother        stomach   Arthritis Brother    Arthritis Brother    Hypertension Brother    Hypertension Brother    Hypertension Mother    Cancer Father        lung, smoked 2 ppd   Cancer Paternal Uncle        cancer   Cancer Paternal Uncle        stomach    Social History:  reports that he has never smoked. He has never used  smokeless tobacco. He reports that he does not drink alcohol and does not use drugs.  ROS: A complete review of systems was performed.  All systems are negative except for pertinent findings as noted.  Physical Exam:  Vital signs in last 24 hours: Temp:  [97.1 F (36.2 C)-98.2 F (36.8 C)] 97.7 F (36.5 C) (03/11 0800) Pulse Rate:  [68-91] 80 (03/11 0800) Resp:  [15-20] 16 (03/11 0400) BP: (88-158)/(72-97) 130/97 (03/11 0800) SpO2:  [93 %-100 %] 98 % (03/11 0800) GENERAL APPEARANCE:  Well appearing, well developed, well nourished, NAD HEENT:  Atraumatic, normocephalic, oropharynx clear NECK:   Supple  ABDOMEN:  Soft, non-tender, no masses; lower midline incision EXTREMITIES:  Moves all extremities well, without clubbing, cyanosis, or edema NEUROLOGIC:  Alert and oriented x 3, CN II-XII grossly intact MENTAL STATUS:  appropriate BACK:  Non-tender to palpation, No CVAT SKIN:  Warm, dry, and intact GU:  uncircumcised phallus; meatus normal; scrotum without erythema or edema  Laboratory Data:  Recent Labs    06/27/23 1217 06/27/23 1226 06/28/23 0444 06/29/23 0424  WBC 17.9*  --  21.4* 16.5*  HGB 13.8 14.3 12.5* 11.9*  HCT 42.8 42.0 38.4* 35.6*  PLT 165  --  161 164    Recent Labs    06/27/23 1217 06/27/23 1226 06/28/23 0444 06/29/23 0424  NA 140 141 140 141  K 3.5 3.7 3.4* 3.8  CL 108 109 112* 111  GLUCOSE 186* 175* 147* 135*  BUN 31* 39* 31* 28*  CALCIUM 9.4  --  8.3* 8.2*  CREATININE 1.23 1.10 1.14 0.88     Results for orders placed or performed during the hospital encounter of 06/27/23 (from the past 24 hours)  Heparin level (unfractionated)     Status: None   Collection Time: 06/28/23  1:55 PM  Result Value Ref Range   Heparin Unfractionated 0.30 0.30 - 0.70 IU/mL  Procalcitonin     Status: None   Collection Time: 06/28/23  1:55 PM  Result Value Ref Range   Procalcitonin 8.29 ng/mL  Folate     Status: None   Collection Time: 06/28/23  2:06 PM  Result Value Ref Range   Folate 7.6 >5.9 ng/mL  CBC     Status: Abnormal   Collection Time: 06/29/23  4:24 AM  Result Value Ref Range   WBC 16.5 (H) 4.0 - 10.5 K/uL   RBC 4.06 (L) 4.22 - 5.81 MIL/uL   Hemoglobin 11.9 (L) 13.0 - 17.0 g/dL   HCT 16.1 (L) 09.6 - 04.5 %   MCV 87.7 80.0 - 100.0 fL   MCH 29.3 26.0 - 34.0 pg   MCHC 33.4 30.0 - 36.0 g/dL   RDW 40.9 81.1 - 91.4 %   Platelets 164 150 - 400 K/uL   nRBC 0.0 0.0 - 0.2 %  Basic metabolic panel     Status: Abnormal   Collection Time: 06/29/23  4:24 AM  Result Value Ref Range   Sodium 141 135 - 145 mmol/L   Potassium 3.8 3.5 - 5.1 mmol/L    Chloride 111 98 - 111 mmol/L   CO2 23 22 - 32 mmol/L   Glucose, Bld 135 (H) 70 - 99 mg/dL   BUN 28 (H) 8 - 23 mg/dL   Creatinine, Ser 7.82 0.61 - 1.24 mg/dL   Calcium 8.2 (L) 8.9 - 10.3 mg/dL   GFR, Estimated >95 >62 mL/min   Anion gap 7 5 - 15  Magnesium     Status: None  Collection Time: 06/29/23  4:24 AM  Result Value Ref Range   Magnesium 2.1 1.7 - 2.4 mg/dL  Phosphorus     Status: None   Collection Time: 06/29/23  4:24 AM  Result Value Ref Range   Phosphorus 2.7 2.5 - 4.6 mg/dL  Brain natriuretic peptide     Status: Abnormal   Collection Time: 06/29/23  4:24 AM  Result Value Ref Range   B Natriuretic Peptide 718.3 (H) 0.0 - 100.0 pg/mL  Procalcitonin     Status: None   Collection Time: 06/29/23  4:24 AM  Result Value Ref Range   Procalcitonin 7.12 ng/mL  CK     Status: Abnormal   Collection Time: 06/29/23  4:24 AM  Result Value Ref Range   Total CK 1,908 (H) 49 - 397 U/L   Recent Results (from the past 240 hours)  Resp panel by RT-PCR (RSV, Flu A&B, Covid) Anterior Nasal Swab     Status: None   Collection Time: 06/27/23 12:21 PM   Specimen: Anterior Nasal Swab  Result Value Ref Range Status   SARS Coronavirus 2 by RT PCR NEGATIVE NEGATIVE Final   Influenza A by PCR NEGATIVE NEGATIVE Final   Influenza B by PCR NEGATIVE NEGATIVE Final    Comment: (NOTE) The Xpert Xpress SARS-CoV-2/FLU/RSV plus assay is intended as an aid in the diagnosis of influenza from Nasopharyngeal swab specimens and should not be used as a sole basis for treatment. Nasal washings and aspirates are unacceptable for Xpert Xpress SARS-CoV-2/FLU/RSV testing.  Fact Sheet for Patients: BloggerCourse.com  Fact Sheet for Healthcare Providers: SeriousBroker.it  This test is not yet approved or cleared by the [place] FDA and has been authorized for detection and/or diagnosis of SARS-CoV-2 by FDA under an Emergency Use Authorization (EUA). This  EUA will remain in effect (meaning this test can be used) for the duration of the COVID-19 declaration under Section 564(b)(1) of the Act, 21 U.S.C. section 360bbb-3(b)(1), unless the authorization is terminated or revoked.     Resp Syncytial Virus by PCR NEGATIVE NEGATIVE Final    Comment: (NOTE) Fact Sheet for Patients: BloggerCourse.com  Fact Sheet for Healthcare Providers: SeriousBroker.it  This test is not yet approved or cleared by the [place] FDA and has been authorized for detection and/or diagnosis of SARS-CoV-2 by FDA under an Emergency Use Authorization (EUA). This EUA will remain in effect (meaning this test can be used) for the duration of the COVID-19 declaration under Section 564(b)(1) of the Act, 21 U.S.C. section 360bbb-3(b)(1), unless the authorization is terminated or revoked.  Performed at [facility], [address] [place], [place] [address]   Blood Culture (routine x 2)     Status: None (Preliminary result)   Collection Time: 06/27/23  2:32 PM   Specimen: BLOOD  Result Value Ref Range Status   Specimen Description BLOOD SITE NOT SPECIFIED  Final   Special Requests   Final    BOTTLES DRAWN AEROBIC ONLY Blood Culture results may not be optimal due to an inadequate volume of blood received in culture bottles   Culture   Final    NO GROWTH 2 DAYS Performed at [facility], [address], [place], [place] [address]    Report Status PENDING  Incomplete  Blood Culture (routine x 2)     Status: None (Preliminary result)   Collection Time: 06/27/23  2:37 PM   Specimen: BLOOD  Result Value Ref Range Status   Specimen Description BLOOD  SITE NOT SPECIFIED  Final   Special Requests   Final    BOTTLES DRAWN AEROBIC ONLY Blood Culture results may not be optimal due to an inadequate volume of blood received in culture bottles   Culture   Final    NO GROWTH 2 DAYS Performed at [facility], [address] [place], [place] [address]    Report Status PENDING  Incomplete  Urine culture     Status: Abnormal   Collection Time: 06/27/23  3:12 PM   Specimen: Urine, Clean Catch  Result Value Ref Range Status   Specimen Description URINE, CLEAN CATCH  Final   Special Requests   Final    NONE Performed at [facility], [address], [place], [place] [address]    Culture MULTIPLE SPECIES PRESENT, SUGGEST RECOLLECTION (A)  Final   Report Status 06/28/2023 FINAL  Final    Renal Function: Recent Labs    06/27/23 1217 06/27/23 1226 06/28/23 0444 06/29/23 0424  CREATININE 1.23 1.10 1.14 0.88   Estimated Creatinine Clearance: 62.5 mL/min (by C-G formula based on SCr of 0.88 mg/dL).  Radiologic Imaging: CT ABDOMEN PELVIS W CONTRAST Result Date: 06/27/2023 CLINICAL DATA:  Acute abdominal pain EXAM: CT ABDOMEN AND PELVIS WITH CONTRAST TECHNIQUE: Multidetector CT imaging of the abdomen and pelvis was performed using the standard protocol following bolus administration of intravenous contrast. RADIATION DOSE REDUCTION: This exam was performed according to the departmental dose-optimization program which includes automated exposure control, adjustment of the mA and/or kV according to patient size and/or use of iterative reconstruction technique. CONTRAST:  75mL OMNIPAQUE IOHEXOL 350 MG/ML SOLN COMPARISON:  CT abdomen and pelvis 03/16/2023. FINDINGS: Lower chest: No acute abnormality. Hepatobiliary: No focal liver abnormality is seen. Status post cholecystectomy. No biliary dilatation. Pancreas: There are scattered hypodensities throughout the pancreas measuring 1 cm or less. There is mild diffuse pancreatic atrophy. There is no acute inflammation or ductal dilatation. Findings are similar to prior. Spleen: Normal in size without focal abnormality. Adrenals/Urinary Tract: There are ill-defined patchy areas of cortical hypodensity throughout both kidneys with bilateral perinephric  fat stranding, right greater than left. There is no hydronephrosis. Bilateral renal cysts are similar to the prior examination measuring up to 5.5 cm. The adrenal glands and bladder are within normal limits. Stomach/Bowel: Stomach is within normal limits. Appendix appears normal. No evidence of bowel wall thickening, distention, or inflammatory changes. There is sigmoid colon diverticulosis. Vascular/Lymphatic: Aortic atherosclerosis. No enlarged abdominal or pelvic lymph nodes. Reproductive: TURP defect in the prostate gland. Other: Mild presacral edema. Small fat containing umbilical hernias. Musculoskeletal: T12-L1 chronic compression deformities are unchanged. IMPRESSION: 1. Findings compatible with bilateral pyelonephritis. No hydronephrosis. 2. Stable bilateral renal cysts. 3. 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 Recommend follow-up MRI in 2 years. 4. Sigmoid colon diverticulosis. 5. Aortic atherosclerosis. Aortic Atherosclerosis (ICD10-I70.0). Electronically Signed   By: Darliss Cheney M.D.   On: 06/27/2023 20:31   CT HEAD WO CONTRAST Result Date: 06/27/2023 CLINICAL DATA:  Head trauma, fall, on blood thinners EXAM: CT HEAD WITHOUT CONTRAST CT CERVICAL SPINE WITHOUT CONTRAST TECHNIQUE: Multidetector CT imaging of the head and cervical spine was performed following the standard protocol without intravenous contrast. Multiplanar CT image reconstructions of the cervical spine were also generated. RADIATION DOSE REDUCTION: This exam was performed according to the departmental dose-optimization program which includes automated exposure control, adjustment of the mA and/or kV according to patient size and/or use of iterative reconstruction technique. COMPARISON:  CT cervical spine, 10/14/2019 FINDINGS: CT HEAD FINDINGS Brain: No evidence of acute infarction, acute hemorrhage, hydrocephalus, or mass lesion/mass effect. Chronic, fluid attenuation left frontal hygroma measuring 0.7 cm in thickness  (series 2, image 19, series 4, image 51). Encephalomalacia of the right cerebellar hemisphere (series 2, image 25). Periventricular white matter hypodensity. Vascular: No hyperdense vessel or unexpected calcification. Skull: Normal. Negative for fracture or focal lesion. Sinuses/Orbits: No acute finding. Other: Soft tissue contusion of the left forehead (series 2, image 15). CT CERVICAL SPINE FINDINGS Alignment: Degenerative straightening of the normal cervical lordosis. Skull base and vertebrae: No acute fracture. No primary bone lesion or focal pathologic process. Soft tissues and spinal canal: No prevertebral fluid or swelling. No visible canal hematoma. Disc levels: Mild moderate multilevel cervical disc degenerative disease, worst from C5-C7. Upper chest: Negative. Other: None. IMPRESSION: 1. No acute intracranial pathology. 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 2. Chronic, fluid attenuation left frontal hygroma measuring 0.7 cm in thickness. No acute hemorrhage. 3. Soft tissue contusion of the left forehead. 4. No fracture or static subluxation of the cervical spine. 5. Mild-to-moderate multilevel cervical disc degenerative disease, worst from C5-C7. Electronically Signed   By: Jearld Lesch M.D.   On: 06/27/2023 13:22   CT CERVICAL SPINE WO CONTRAST Result Date: 06/27/2023 CLINICAL DATA:  Head trauma, fall, on blood thinners EXAM: CT HEAD WITHOUT CONTRAST CT CERVICAL SPINE WITHOUT CONTRAST TECHNIQUE: Multidetector CT imaging of the head and cervical spine was performed following the standard protocol without intravenous contrast. Multiplanar CT image reconstructions of the cervical spine were also generated. RADIATION DOSE REDUCTION: This exam was performed according to the departmental dose-optimization program which includes automated exposure control, adjustment of the mA and/or kV according to patient size and/or use of iterative reconstruction technique. COMPARISON:   CT cervical spine, 10/14/2019 FINDINGS: CT HEAD FINDINGS Brain: No evidence of acute infarction, acute hemorrhage, hydrocephalus, or mass lesion/mass effect. Chronic, fluid attenuation left frontal hygroma measuring 0.7 cm in thickness (series 2, image 19, series 4, image 51). Encephalomalacia of the right cerebellar hemisphere (series 2, image 25). Periventricular white matter hypodensity. Vascular: No hyperdense vessel or unexpected calcification. Skull: Normal. Negative for fracture or focal lesion. Sinuses/Orbits: No acute finding. Other: Soft tissue contusion of the left forehead (series 2, image 15). CT CERVICAL SPINE FINDINGS Alignment: Degenerative straightening of the normal cervical lordosis. Skull base and vertebrae: No acute fracture. No primary bone lesion or focal pathologic process. Soft tissues and spinal canal: No prevertebral fluid or swelling. No visible canal hematoma. Disc levels: Mild moderate multilevel cervical disc degenerative disease, worst from C5-C7. Upper chest: Negative. Other: None. IMPRESSION: 1. No acute intracranial pathology. 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 2. Chronic, fluid attenuation left frontal hygroma measuring 0.7 cm in thickness. No acute hemorrhage. 3. Soft tissue contusion of the left forehead. 4. No fracture or static subluxation of the cervical spine. 5. Mild-to-moderate multilevel cervical disc degenerative disease, worst from C5-C7. Electronically Signed   By: Jearld Lesch M.D.   On: 06/27/2023 13:22    I independently reviewed the above imaging studies.  I attempted placement of a foley using a 42F coude catheter.  There was resistance in the distal urethra.  Procedure: flexible cysto bedside   Procedure:  Flexible Cystourethroscopy  Pre-operative Diagnosis:  Urinary retention  Post-operative Diagnosis:  urinary retention, urethral stricture disease  Anesthesia:  local with lidocaine jelly  Surgical  Narrative:  After appropriate informed consent was  obtained, the patient was prepped and draped in the usual sterile fashion in the supine position.  The patient was correctly identified and the proper procedure delineated prior to proceeding.  Sterile lidocaine gel was instilled in the urethra. The flexible cystoscope was introduced without difficulty.  Findings: I was unable to pass the cystoscope into the distal urethra to visualize the urethral lumen.  I attempted passage of a sensor guidewire but discontinued due to patient discomfort. The cystoscope was then removed.  The patient tolerated the procedure well.    Impression/Recommendation Urinary retention UTI with sepsis Urethral stricture disease History of prostate cancer  Recommend SP tube placement by IR for management of urinary retention in setting of UTI/sepsis. This would provide immediate bladder drainage to allow resolution of his UTI and allow for outpatient management of his urethral stricture disease. Will follow while in hospital. Patient will need outpatient follow-up with Dr. Berneice Heinrich.  Di Kindle 06/29/2023, 12:46 PM

## 2023-06-29 NOTE — Progress Notes (Signed)
   06/29/23 2118  External Urinary Catheter  Placement Date/Time: 06/28/23 2000   External Urinary Catheter Type: Male External Catheter with Suction  Pubic hair clipped (verbal consent obtained): N/A  Output (mL) 250 mL  Urine Characteristics  Urinary Incontinence Yes  Urine Color Amber  Urine Appearance Cloudy  and foul smell.  Urinary Interventions Post spontaneous void bladder scanning  Bladder Scan Volume (mL)  653 mL  Hygiene Peri care   Dr. Margo Aye made aware that Pt has bladder distention from urine outlet occlusion. Plan for suprapubic catheter insertion tomorrow by urologist. Pt is stable hemodynamically. Denies pain.No obvious acute distress.   Pt has a complaint of dry mouth, so he drinks lots of water. Limit oral fluid intake to slow down the bladder distention and NPO after midnight preparing for the procedure were discussed with Pt. He expressed his understanding.  Pt right eye has red conjunctiva and yellowish drainage with mind itching, normal baseline gross visual acuity, no blurry or vision impairment. Pt stated he has had this right eye problem for a long time from allergies trigger. He requested eye drop. Dr. Margo Aye was notified. We will hand off to the day shift team.   We will monitor.   Filiberto Pinks, RN

## 2023-06-29 NOTE — Progress Notes (Addendum)
 I spoke with Pt's daughter named [name], who identified herself as an Charity fundraiser working at [employer]. She called by phone around 11:00 pm and reported that someone canceled Pt's dinner tray, so Pt did not get his dinner. She expressed her frustrations and concerned that Pt has not been eating well and Pt had complaints about GI upset and nausea yesterday evening. She would like to report to the head of dietitian. Emotional support was given. We encouraged family member to assist and take initiative to order meal for Pt before the cafeteria closed. Pt's daughter expressed her appreciations after we modified diet order to be room serviced with assistance.   I assessed the Pt and he refused to eat anything except a cup of apple sauce and a cup of orange juice tonight because of his felt nauseated. His symptom was relived after Zofran given. Continue to monitor.    Matthew Pinks, RN

## 2023-06-29 NOTE — Plan of Care (Signed)

## 2023-06-29 NOTE — Progress Notes (Addendum)
   06/29/23 0228  Provider Notification  Provider Name/Title Dr. Delma Post  Date Provider Notified 06/29/23  Time Provider Notified [phone]  Method of Notification Page  Notification Reason Urine retention, bladder scanning found 446 ml.  Provider response Evaluated remotely. MD ordered for intermittent urinary catheter.   Date of Provider Response 06/29/23  Time of Provider Response 0229    We were unable to insert the intermittent urinary catheter more than 0.5 cm. We tried with 14 Fr and 16 Fr, but had to stop after gently advanced the catheter multiple attempts by Harvie Heck, RN incharge and I.  Pt had not shown signs of any bleeding or injuries, but felt uncomfortable, anxious, and requested for sedative medication.  Dr. Margo Aye was notified. Dr. Margo Aye planned to consult urologist.  CCMD reported Pt's EKG occasionally showed short unsustained VT and frequent PACs, especially with exertions, HR 140-150, but at rest HR was 70s-80s, BP stable, afebrile.   Urine specimen from new clean set of male external catheter(Premofit) was sent to the lab for urine culture.  We will hand off to the day shift team and continue to monitor closely.  Filiberto Pinks, RN

## 2023-06-29 NOTE — Progress Notes (Signed)
 PROGRESS NOTE    Matthew Mckay  WUJ:[id number] DOB: Jan 13, 1941 DOA: 06/27/2023 PCP: Jac Canavan, PA-C    Chief Complaint  Patient presents with   Fall    Brief Narrative:   Matthew Mckay is a 83 y.o. male with medical history significant of GERD, HTN, HLD, hx of prostate cancer, atrial fibrillation, DDD, BPH with hx of urinary retention who presented to ED after a fall.  He is up at night multiple time due to increased urinary frequency, where he is wearing pads,  Daughter reports he has fallen around 7AM and she got to this house around 11AM. She lives across the street from him.  -In ED his workup significant for sepsis, secondary to bilateral pyelonephritis, as well he had evidence of urinary retention    Assessment & Plan:   Principal Problem:   Sepsis (HCC) Active Problems:   Rhabdomyolysis   Elevated troponin   Atrial fibrillation (HCC)   Essential hypertension   GERD (gastroesophageal reflux disease)   Hyperlipidemia   History of prostate cancer   Tremor   Urinary retention   Urethral stricture  Sepsis (HCC)/secondary to UTI with cystitis and bilateral perinephritis -Sepsis present on admission, febrile, tachypneic, tachycardic, with elevated lactic acid and leukocytosis -Continue with IV Rocephin. -Continue with IV fluids -Follow-up blood cultures and urine cultures, adjust antibiotics as needed -Continue to trend procalcitonin and white blood cell count.  Trending down which is reassuring -Urine culture growing multiple species, will resend.  Urinary retention Urinary strictures History of prostate cancer -Patient was noted with urinary retention, averaging 400 to 500 cc postvoid, Foley catheter could not be inserted by urology, recommendation for suprapubic Foley catheter, discussed with patient, daughter-in-law, both agreeable, IR consulted for suprapubic Foley catheter insertion.   Rhabdomyolysis On fall x 4 hours or so, appears mild will  trend Continue with IV fluid, total CK trending down Hold lipitor     Elevated troponin Troponin flat, no chest pain Likely demand ischemia in setting of sepsis/afib  Continue tele monitoring    Atrial fibrillation (HCC) CHADS2VASC score of 3.  S/p DCCV on 09/02/22  Family stopped eliquis in November due to gross hematuria  Discussed with family, daughter, granddaughter Dr. Fonda Kinder, unsteady, with multiple falls, recurrent hemorrhagic cystitis, risks outweighed benefits, stop anticoagulation.   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 Berneice Heinrich, this post total resection, and radiation, 1999, status post TURP in 2005 as discussed with urology, developed radiation cystitis, patient recurrent hemorrhagic cystitis .    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 -recommend follow-up MRI in 2 years per radiology.    DVT prophylaxis: subcu heparin  Code Status: Full Family Communication: Discussed with daughter at bedside, and granddaughter Dr. Fonda Kinder by  phone [phone], 3/10, and 3/11 Disposition:   Status is: Inpatient    Consultants:  none   Subjective:  Patient had retention overnight, Foley catheter could not be inserted by urologist, otherwise ambulated good with physical therapy today, has been able to urinate but postvoid bladder scan in the 4500 range.  Objective: Vitals:   06/29/23 0200 06/29/23 0400 06/29/23 0800 06/29/23 1200  BP:  (!) 133/94 (!) 130/97 (!) 146/85  Pulse: 81  74 80 94  Resp:  16    Temp:  97.7 F (36.5 C) 97.7 F (36.5 C)   TempSrc:  Axillary Oral   SpO2: 99% 100% 98% 91%  Weight:      Height:         Intake/Output Summary (Last 24 hours) at 06/29/2023 1420 Last data filed at 06/29/2023 1250 Gross per 24 hour  Intake 1107.05 ml  Output 1625 ml  Net -517.95 ml   Filed Weights   06/27/23 1221  Weight: 74.8 kg    Examination:  Awake Alert, Oriented X 3, frail, left forehead bruise Symmetrical Chest wall movement, Good air movement bilaterally, CTAB RRR,No Gallops,Rubs or new Murmurs, No Parasternal Heave +ve B.Sounds, suprapubic fullness and mild tenderness present No Cyanosis, Clubbing or edema, No new Rash or bruise      Data Reviewed: I have personally reviewed following labs and imaging studies  CBC: Recent Labs  Lab 06/27/23 1217 06/27/23 1226 06/28/23 0444 06/29/23 0424  WBC 17.9*  --  21.4* 16.5*  HGB 13.8 14.3 12.5* 11.9*  HCT 42.8 42.0 38.4* 35.6*  MCV 91.8  --  91.0 87.7  PLT 165  --  161 164    Basic Metabolic Panel: Recent Labs  Lab 06/27/23 1217 06/27/23 1226 06/27/23 1515 06/28/23 0444 06/29/23 0424  NA 140 141  --  140 141  K 3.5 3.7  --  3.4* 3.8  CL 108 109  --  112* 111  CO2 22  --   --  20* 23  GLUCOSE 186* 175*  --  147* 135*  BUN 31* 39*  --  31* 28*  CREATININE 1.23 1.10  --  1.14 0.88  CALCIUM 9.4  --   --  8.3* 8.2*  MG 1.8  --   --  1.7 2.1  PHOS  --   --  2.4* 2.2* 2.7    GFR: Estimated Creatinine Clearance: 62.5 mL/min (by C-G formula based on SCr of 0.88 mg/dL).  Liver Function Tests: Recent Labs  Lab 06/27/23 1217 06/28/23 0444  AST 32 69*  ALT 14 26  ALKPHOS 34* 34*  BILITOT 1.0 0.7  PROT 6.5 5.8*  ALBUMIN 2.7* 2.3*    CBG: Recent Labs  Lab 06/28/23 0756  GLUCAP 155*     Recent Results (from the past 240 hours)  Resp panel by RT-PCR (RSV, Flu A&B, Covid) Anterior Nasal Swab     Status: None   Collection Time: 06/27/23 12:21 PM   Specimen: Anterior Nasal Swab  Result Value Ref Range Status   SARS Coronavirus 2 by RT PCR NEGATIVE NEGATIVE Final   Influenza A by PCR NEGATIVE NEGATIVE Final    Influenza B by PCR NEGATIVE NEGATIVE Final    Comment: (NOTE) The Xpert Xpress SARS-CoV-2/FLU/RSV plus assay is intended as an aid in the diagnosis of influenza from Nasopharyngeal swab specimens and should not be used as a sole basis for treatment. Nasal washings and aspirates are unacceptable for Xpert Xpress SARS-CoV-2/FLU/RSV testing.  Fact Sheet for Patients: BloggerCourse.com  Fact Sheet for Healthcare Providers: SeriousBroker.it  This test is not yet approved or cleared by the [place] FDA and has been authorized for detection and/or diagnosis of SARS-CoV-2 by FDA under an Emergency Use Authorization (EUA). This EUA will remain in effect (meaning this test can be used) for the duration of the COVID-19 declaration under Section 564(b)(1) of the Act, 21 U.S.C. section 360bbb-3(b)(1), unless the authorization is terminated or revoked.  Resp Syncytial Virus by PCR NEGATIVE NEGATIVE Final    Comment: (NOTE) Fact Sheet for Patients: BloggerCourse.com  Fact Sheet for Healthcare Providers: SeriousBroker.it  This test is not yet approved or cleared by the [place] FDA and has been authorized for detection and/or diagnosis of SARS-CoV-2 by FDA under an Emergency Use Authorization (EUA). This EUA will remain in effect (meaning this test can be used) for the duration of the COVID-19 declaration under Section 564(b)(1) of the Act, 21 U.S.C. section 360bbb-3(b)(1), unless the authorization is terminated or revoked.  Performed at [facility], [address] [place], [place] [address]   Blood Culture (routine x 2)     Status: None (Preliminary result)   Collection Time: 06/27/23  2:32 PM   Specimen: BLOOD  Result Value Ref Range Status   Specimen Description BLOOD SITE NOT SPECIFIED  Final   Special Requests   Final    BOTTLES DRAWN AEROBIC ONLY Blood Culture  results may not be optimal due to an inadequate volume of blood received in culture bottles   Culture   Final    NO GROWTH 2 DAYS Performed at [facility], [address], [place], [place] [address]    Report Status PENDING  Incomplete  Blood Culture (routine x 2)     Status: None (Preliminary result)   Collection Time: 06/27/23  2:37 PM   Specimen: BLOOD  Result Value Ref Range Status   Specimen Description BLOOD SITE NOT SPECIFIED  Final   Special Requests   Final    BOTTLES DRAWN AEROBIC ONLY Blood Culture results may not be optimal due to an inadequate volume of blood received in culture bottles   Culture   Final    NO GROWTH 2 DAYS Performed at [facility], [address] [place], [place] [address]    Report Status PENDING  Incomplete  Urine culture     Status: Abnormal   Collection Time: 06/27/23  3:12 PM   Specimen: Urine, Clean Catch  Result Value Ref Range Status   Specimen Description URINE, CLEAN CATCH  Final   Special Requests   Final    NONE Performed at [facility], [address] [place], [place] [address]    Culture MULTIPLE SPECIES PRESENT, SUGGEST RECOLLECTION (A)  Final   Report Status 06/28/2023 FINAL  Final         Radiology Studies: CT ABDOMEN PELVIS W CONTRAST Result Date: 06/27/2023 CLINICAL DATA:  Acute abdominal pain EXAM: CT ABDOMEN AND PELVIS WITH CONTRAST TECHNIQUE: Multidetector CT imaging of the abdomen and pelvis was performed using the standard protocol following bolus administration of intravenous contrast. RADIATION DOSE REDUCTION: This exam was performed according to the departmental dose-optimization program which includes automated exposure control, adjustment of the mA and/or kV according to patient size and/or use of iterative reconstruction technique. CONTRAST:  75mL OMNIPAQUE IOHEXOL 350 MG/ML SOLN COMPARISON:  CT abdomen and pelvis 03/16/2023. FINDINGS: Lower chest: No acute abnormality. Hepatobiliary: No focal  liver abnormality is seen. Status post cholecystectomy. No biliary dilatation. Pancreas: There are scattered hypodensities throughout the pancreas measuring 1 cm or less. There is mild diffuse pancreatic atrophy. There is no acute inflammation or ductal dilatation. Findings are similar to prior. Spleen: Normal in size without focal abnormality. Adrenals/Urinary Tract: There are ill-defined patchy areas of cortical hypodensity throughout both kidneys with bilateral perinephric fat stranding, right greater than left. There is no hydronephrosis. Bilateral renal cysts are similar to the  prior examination measuring up to 5.5 cm. The adrenal glands and bladder are within normal limits. Stomach/Bowel: Stomach is within normal limits. Appendix appears normal. No evidence of bowel wall thickening, distention, or inflammatory changes. There is sigmoid colon diverticulosis. Vascular/Lymphatic: Aortic atherosclerosis. No enlarged abdominal or pelvic lymph nodes. Reproductive: TURP defect in the prostate gland. Other: Mild presacral edema. Small fat containing umbilical hernias. Musculoskeletal: T12-L1 chronic compression deformities are unchanged. IMPRESSION: 1. Findings compatible with bilateral pyelonephritis. No hydronephrosis. 2. Stable bilateral renal cysts. 3. 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 Recommend follow-up MRI in 2 years. 4. Sigmoid colon diverticulosis. 5. Aortic atherosclerosis. Aortic Atherosclerosis (ICD10-I70.0). Electronically Signed   By: Darliss Cheney M.D.   On: 06/27/2023 20:31        Scheduled Meds:  feeding supplement  237 mL Oral BID BM   heparin injection (subcutaneous)  5,000 Units Subcutaneous Q8H   metoprolol succinate  25 mg Oral Daily   Continuous Infusions:  cefTRIAXone (ROCEPHIN)  IV 2 g (06/29/23 1048)     LOS: 2 days      Huey Bienenstock, MD [facility]   To contact the attending provider between 7A-7P or the covering provider during  after hours 7P-7A, please log into the web site [email or web address] and access using universal [place] password for that web site. If you do not have the password, please call the hospital operator.  06/29/2023, 2:20 PM

## 2023-06-29 NOTE — Progress Notes (Addendum)
   06/29/23 1152  Mobility  Activity Ambulated with assistance in hallway  Level of Assistance Contact guard assist, steadying assist (MinA STS)  Assistive Device Other (Comment) (HHA/ IV Pole.)  Distance Ambulated (ft) 50 ft  Activity Response Tolerated fair  Mobility Referral Yes  Mobility visit 1 Mobility  Mobility Specialist Start Time (ACUTE ONLY) 1120  Mobility Specialist Stop Time (ACUTE ONLY) 1152  Mobility Specialist Time Calculation (min) (ACUTE ONLY) 32 min   Mobility Specialist: Progress Note Visits: 2  Pre-Mobility:      HR 80, SpO2 94% RA Post-Mobility:    HR 83, SpO2 90% RA Pt agreeable to mobility session - received in bed.  Pt without nasal cannula on, left on RA. C/o weakness, dry mouth,and tremors. MinA STS, CG switching between IV pole and HHA. Returned to bed with all needs met - call bell within reach. Bed alarm on.    Pre-Mobility:      HR 82, SpO2 96% RA  During Mobility:             SpO2 85-91% RA Post-Mobility:    HR 87, SpO2 94% RA  Pt agreeable to mobility session - received in bed. Pt without nasal cannula on, left on RA, VSS - RN notified via securechat. C/o shakiness/tremors. Pt desat during ambulation stated his breathing "it goes in and out.". Returned to bed with all needs met - call bell within reach. Bed alarm on.   Barnie Mort, BS Mobility Specialist Please contact via SecureChat or  Rehab office at [phone].

## 2023-06-29 NOTE — Consult Note (Addendum)
 Chief Complaint: Urinary Retention. Request is for Suprapubic Catheter Placement  Referring Physician(s): Dr. Cristine Polio  Supervising Physician: Ruel Favors  Patient Status: [facility] - In-pt  History of Present Illness: Matthew Mckay is a 83 y.o. male inpatient. history of prostate cancer,  a fib (on eliquis), HTN, HLD, CHF,  GERD, urinary retention. Presented to the ED at 3.9.25 after a fall. Found to have urinary retention, UTI and sepsis. Bladder scan [performed at bedside showed 446 ml . Urology attempted a flexible cystourethroscopy at bedside today and was unsuccessful. Team is requesting suprapubic catheter placement.  Currently without any significant complaints. Patient alert and laying in bed,calm. Denies any fevers, headache, chest pain, SOB, cough, abdominal pain, nausea, vomiting or bleeding.   WBC 16.5 BUN 28, BNP 718, CK 1908. Heparin prophylactic dose last given on 3.10.25 @ 13:30. CT Abd pelvis from  3.9.25 reads  Findings compatible with bilateral pyelonephritis. No hydronephrosis. Stable bilateral renal cysts. No pertinent allergies. Patient last ate at 10:00 on 3.11.25  Return precautions and treatment recommendations and follow-up discussed with the patient  who is agreeable with the plan.    Past Medical History:  Diagnosis Date   Allergy    Anxiety    Chronic sinusitis    Diverticulosis    sigmoid colon and increased vascularization due to prior radiation, per 2013 colonoscopy   GERD (gastroesophageal reflux disease)    Hemorrhoid    Hyperlipidemia    Hypertension    Prostate cancer (HCC)    history of radiation therapy   Seasonal allergies    Urinary retention 2019    Past Surgical History:  Procedure Laterality Date   CARDIOVERSION N/A 09/02/2022   Procedure: CARDIOVERSION;  Surgeon: Maisie Fus, MD;  Location: [facility] INVASIVE CV [facility];  Service: Cardiovascular;  Laterality: N/A;   COLONOSCOPY  02/2012   sigmoid diverticulosis, polyps, Dr.  Vida Rigger   CYSTOSCOPY WITH URETHRAL DILATATION N/A 02/10/2019   Procedure: CYSTOSCOPY WITH URETHRAL DILATATION,;  Surgeon: Sebastian Ache, MD;  Location: [facility] ORS;  Service: Urology;  Laterality: N/A;   PROSTATECTOMY     1990s    Allergies: Pollen extract-tree extract [pollen extract]  Medications: Prior to Admission medications   Medication Sig Start Date End Date Taking? Authorizing Provider  atorvastatin (LIPITOR) 10 MG tablet Take 1 tablet (10 mg total) by mouth daily. 08/24/22  Yes Tysinger, Kermit Balo, PA-C  Cholecalciferol (VITAMIN D3) 75 MCG (3000 UT) TABS Take 75 mcg by mouth daily.   Yes [provider]  ELIQUIS 5 MG TABS tablet TAKE 1 TABLET BY MOUTH TWICE A DAY 11/30/22  Yes Tysinger, Kermit Balo, PA-C  fluticasone (FLONASE) 50 MCG/ACT nasal spray Place 2 sprays into both nostrils daily. SPRAY 2 SPRAYS INTO EACH NOSTRIL EVERY DAY Patient taking differently: Place 2 sprays into both nostrils daily as needed for allergies. 11/30/22  Yes Tysinger, Kermit Balo, PA-C  levocetirizine (XYZAL) 5 MG tablet TAKE 1 TABLET BY MOUTH EVERY DAY IN THE EVENING 11/30/22  Yes Tysinger, Kermit Balo, PA-C  metoprolol succinate (TOPROL-XL) 25 MG 24 hr tablet Take 1 tablet (25 mg total) by mouth daily. Appt req for refill [id number] 06/09/23  Yes Eustace Pen, PA-C  lisinopril (ZESTRIL) 20 MG tablet Take 1 tablet (20 mg total) by mouth daily. Patient not taking: Reported on 06/27/2023 11/30/22   Tysinger, Kermit Balo, PA-C  Peppermint Oil (IBGARD) 90 MG CPCR Take 1 tablet by mouth daily. Patient not taking: Reported on 06/27/2023 11/30/22  Tysinger, Kermit Balo, PA-C     Family History  Problem Relation Age of Onset   Cancer Brother        stomach   Arthritis Brother    Arthritis Brother    Hypertension Brother    Hypertension Brother    Hypertension Mother    Cancer Father        lung, smoked 2 ppd   Cancer Paternal Uncle        cancer   Cancer Paternal Uncle        stomach    Social History    Socioeconomic History   Marital status: Widowed    Spouse name: Not on file   Number of children: Not on file   Years of education: Not on file   Highest education level: Not on file  Occupational History   Not on file  Tobacco Use   Smoking status: Never   Smokeless tobacco: Never  Vaping Use   Vaping status: Never Used  Substance and Sexual Activity   Alcohol use: No   Drug use: No   Sexual activity: Not on file  Other Topics Concern   Not on file  Social History Narrative   Lives alone. Widowed in 2009.   Handles own ADLs.   Watches [employer], always busy, yard work, garden work, works at [employer].    Goes to [place].   6 brothers and 3 sisters.  Originally from [place], brought up in [place].  Father is [place], mother from [place]. Has family all over the place, [place], [place], [place], [place]   Social Drivers of Health   Financial Resource Strain: Low Risk  (12/22/2022)   Overall Financial Resource Strain (CARDIA)    Difficulty of Paying Living Expenses: Not hard at all  Food Insecurity: No Food Insecurity (06/27/2023)   Hunger Vital Sign    Worried About Running Out of Food in the Last Year: Never true    Ran Out of Food in the Last Year: Never true  Transportation Needs: No Transportation Needs (06/27/2023)   PRAPARE - Administrator, Civil Service (Medical): No    Lack of Transportation (Non-Medical): No  Physical Activity: Inactive (12/22/2022)   Exercise Vital Sign    Days of Exercise per Week: 0 days    Minutes of Exercise per Session: 0 min  Stress: No Stress Concern Present (12/22/2022)   [employer] of Occupational Health - Occupational Stress Questionnaire    Feeling of Stress : Not at all  Social Connections: Moderately Isolated (06/27/2023)   Social Connection and Isolation Panel [NHANES]    Frequency of Communication with Friends and Family: More than three times a week    Frequency of Social Gatherings with Friends and Family: More  than three times a week    Attends Religious Services: More than 4 times per year    Active Member of [employer] or Organizations: No    Attends Banker Meetings: Never    Marital Status: Widowed     Review of Systems: A 12 point ROS discussed and pertinent positives are indicated in the HPI above.  All other systems are negative.  Review of Systems  Constitutional:  Negative for fever.  HENT:  Negative for congestion.   Respiratory:  Negative for cough and shortness of breath.   Cardiovascular:  Negative for chest pain.  Gastrointestinal:  Negative for abdominal pain.  Neurological:  Negative for headaches.  Psychiatric/Behavioral:  Negative for behavioral problems  and confusion.     Vital Signs: BP (!) 146/85 (BP Location: Left Arm)   Pulse 94   Temp 97.7 F (36.5 C) (Oral)   Resp 16   Ht 5\' 9"  (1.753 m)   Wt 165 lb (74.8 kg)   SpO2 91%   BMI 24.37 kg/m   Advance Care Plan: The advanced care plan/surrogate decision maker was discussed at the time of visit and documented in the medical record.  daughter   Physical Exam Vitals and nursing note reviewed.  Constitutional:      Appearance: He is well-developed.  HENT:     Head: Normocephalic.     Mouth/Throat:     Mouth: Mucous membranes are dry.  Cardiovascular:     Rate and Rhythm: Normal rate and regular rhythm.  Pulmonary:     Effort: Pulmonary effort is normal.  Musculoskeletal:        General: Normal range of motion.     Cervical back: Normal range of motion.  Skin:    General: Skin is warm and dry.  Neurological:     General: No focal deficit present.     Mental Status: He is alert and oriented to person, place, and time. Mental status is at baseline.  Psychiatric:        Mood and Affect: Mood normal.        Thought Content: Thought content normal.        Judgment: Judgment normal.     Imaging: CT ABDOMEN PELVIS W CONTRAST Result Date: 06/27/2023 CLINICAL DATA:  Acute abdominal pain EXAM: CT  ABDOMEN AND PELVIS WITH CONTRAST TECHNIQUE: Multidetector CT imaging of the abdomen and pelvis was performed using the standard protocol following bolus administration of intravenous contrast. RADIATION DOSE REDUCTION: This exam was performed according to the departmental dose-optimization program which includes automated exposure control, adjustment of the mA and/or kV according to patient size and/or use of iterative reconstruction technique. CONTRAST:  75mL OMNIPAQUE IOHEXOL 350 MG/ML SOLN COMPARISON:  CT abdomen and pelvis 03/16/2023. FINDINGS: Lower chest: No acute abnormality. Hepatobiliary: No focal liver abnormality is seen. Status post cholecystectomy. No biliary dilatation. Pancreas: There are scattered hypodensities throughout the pancreas measuring 1 cm or less. There is mild diffuse pancreatic atrophy. There is no acute inflammation or ductal dilatation. Findings are similar to prior. Spleen: Normal in size without focal abnormality. Adrenals/Urinary Tract: There are ill-defined patchy areas of cortical hypodensity throughout both kidneys with bilateral perinephric fat stranding, right greater than left. There is no hydronephrosis. Bilateral renal cysts are similar to the prior examination measuring up to 5.5 cm. The adrenal glands and bladder are within normal limits. Stomach/Bowel: Stomach is within normal limits. Appendix appears normal. No evidence of bowel wall thickening, distention, or inflammatory changes. There is sigmoid colon diverticulosis. Vascular/Lymphatic: Aortic atherosclerosis. No enlarged abdominal or pelvic lymph nodes. Reproductive: TURP defect in the prostate gland. Other: Mild presacral edema. Small fat containing umbilical hernias. Musculoskeletal: T12-L1 chronic compression deformities are unchanged. IMPRESSION: 1. Findings compatible with bilateral pyelonephritis. No hydronephrosis. 2. Stable bilateral renal cysts. 3. 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  Recommend follow-up MRI in 2 years. 4. Sigmoid colon diverticulosis. 5. Aortic atherosclerosis. Aortic Atherosclerosis (ICD10-I70.0). Electronically Signed   By: Darliss Cheney M.D.   On: 06/27/2023 20:31   CT HEAD WO CONTRAST Result Date: 06/27/2023 CLINICAL DATA:  Head trauma, fall, on blood thinners EXAM: CT HEAD WITHOUT CONTRAST CT CERVICAL SPINE WITHOUT CONTRAST TECHNIQUE: Multidetector CT imaging of the  head and cervical spine was performed following the standard protocol without intravenous contrast. Multiplanar CT image reconstructions of the cervical spine were also generated. RADIATION DOSE REDUCTION: This exam was performed according to the departmental dose-optimization program which includes automated exposure control, adjustment of the mA and/or kV according to patient size and/or use of iterative reconstruction technique. COMPARISON:  CT cervical spine, 10/14/2019 FINDINGS: CT HEAD FINDINGS Brain: No evidence of acute infarction, acute hemorrhage, hydrocephalus, or mass lesion/mass effect. Chronic, fluid attenuation left frontal hygroma measuring 0.7 cm in thickness (series 2, image 19, series 4, image 51). Encephalomalacia of the right cerebellar hemisphere (series 2, image 25). Periventricular white matter hypodensity. Vascular: No hyperdense vessel or unexpected calcification. Skull: Normal. Negative for fracture or focal lesion. Sinuses/Orbits: No acute finding. Other: Soft tissue contusion of the left forehead (series 2, image 15). CT CERVICAL SPINE FINDINGS Alignment: Degenerative straightening of the normal cervical lordosis. Skull base and vertebrae: No acute fracture. No primary bone lesion or focal pathologic process. Soft tissues and spinal canal: No prevertebral fluid or swelling. No visible canal hematoma. Disc levels: Mild moderate multilevel cervical disc degenerative disease, worst from C5-C7. Upper chest: Negative. Other: None. IMPRESSION: 1. No acute intracranial pathology.  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 2. Chronic, fluid attenuation left frontal hygroma measuring 0.7 cm in thickness. No acute hemorrhage. 3. Soft tissue contusion of the left forehead. 4. No fracture or static subluxation of the cervical spine. 5. Mild-to-moderate multilevel cervical disc degenerative disease, worst from C5-C7. Electronically Signed   By: Jearld Lesch M.D.   On: 06/27/2023 13:22   CT CERVICAL SPINE WO CONTRAST Result Date: 06/27/2023 CLINICAL DATA:  Head trauma, fall, on blood thinners EXAM: CT HEAD WITHOUT CONTRAST CT CERVICAL SPINE WITHOUT CONTRAST TECHNIQUE: Multidetector CT imaging of the head and cervical spine was performed following the standard protocol without intravenous contrast. Multiplanar CT image reconstructions of the cervical spine were also generated. RADIATION DOSE REDUCTION: This exam was performed according to the departmental dose-optimization program which includes automated exposure control, adjustment of the mA and/or kV according to patient size and/or use of iterative reconstruction technique. COMPARISON:  CT cervical spine, 10/14/2019 FINDINGS: CT HEAD FINDINGS Brain: No evidence of acute infarction, acute hemorrhage, hydrocephalus, or mass lesion/mass effect. Chronic, fluid attenuation left frontal hygroma measuring 0.7 cm in thickness (series 2, image 19, series 4, image 51). Encephalomalacia of the right cerebellar hemisphere (series 2, image 25). Periventricular white matter hypodensity. Vascular: No hyperdense vessel or unexpected calcification. Skull: Normal. Negative for fracture or focal lesion. Sinuses/Orbits: No acute finding. Other: Soft tissue contusion of the left forehead (series 2, image 15). CT CERVICAL SPINE FINDINGS Alignment: Degenerative straightening of the normal cervical lordosis. Skull base and vertebrae: No acute fracture. No primary bone lesion or focal pathologic process. Soft tissues and spinal canal: No  prevertebral fluid or swelling. No visible canal hematoma. Disc levels: Mild moderate multilevel cervical disc degenerative disease, worst from C5-C7. Upper chest: Negative. Other: None. IMPRESSION: 1. No acute intracranial pathology. 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 2. Chronic, fluid attenuation left frontal hygroma measuring 0.7 cm in thickness. No acute hemorrhage. 3. Soft tissue contusion of the left forehead. 4. No fracture or static subluxation of the cervical spine. 5. Mild-to-moderate multilevel cervical disc degenerative disease, worst from C5-C7. Electronically Signed   By: Jearld Lesch M.D.   On: 06/27/2023 13:22   DG Chest Portable 1 View Result Date: 06/27/2023 CLINICAL DATA:  Fall.  Cough EXAM: PORTABLE CHEST 1 VIEW COMPARISON:  X-ray 07/23/2022 and older FINDINGS: Underinflation. Enlarged cardiopericardial silhouette. Calcified aorta. No pneumothorax, effusion or edema. Overlapping cardiac leads. Degenerative changes along the spine. IMPRESSION: Underinflation.  No acute cardiopulmonary disease. Electronically Signed   By: Karen Kays M.D.   On: 06/27/2023 12:34    Labs:  CBC: Recent Labs    03/17/23 1943 06/27/23 1217 06/27/23 1226 06/28/23 0444 06/29/23 0424  WBC 12.7* 17.9*  --  21.4* 16.5*  HGB 14.7 13.8 14.3 12.5* 11.9*  HCT 44.5 42.8 42.0 38.4* 35.6*  PLT 233 165  --  161 164    COAGS: Recent Labs    02/25/23 2053 03/17/23 1943 06/27/23 1217  INR 1.2 1.3* 1.3*    BMP: Recent Labs    03/17/23 1943 06/27/23 1217 06/27/23 1226 06/28/23 0444 06/29/23 0424  NA 136 140 141 140 141  K 3.9 3.5 3.7 3.4* 3.8  CL 101 108 109 112* 111  CO2 25 22  --  20* 23  GLUCOSE 149* 186* 175* 147* 135*  BUN 27* 31* 39* 31* 28*  CALCIUM 9.6 9.4  --  8.3* 8.2*  CREATININE 0.93 1.23 1.10 1.14 0.88  GFRNONAA >60 58*  --  >60 >60    LIVER FUNCTION TESTS: Recent Labs    02/25/23 2053 03/17/23 1943 06/27/23 1217 06/28/23 0444   BILITOT 0.5 0.7 1.0 0.7  AST 16 18 32 69*  ALT 15 16 14 26   ALKPHOS 42 51 34* 34*  PROT 7.2 8.0 6.5 5.8*  ALBUMIN 3.4* 3.9 2.7* 2.3*     Assessment and Plan:  83 y.o. male inpatient. history of prostate cancer,  a fib (on eliquis), HTN, HLD, CHF,  GERD, urinary retention. Presented to the ED at 3.9.25 after a fall. Found to have urinary retention, UTI and sepsis. Bladder scan [performed at bedside showed 446 ml . Urology attempted a flexible cystourethroscopy at bedside today and was unsuccessful. Team is requesting suprapubic catheter placement.  PLAN: IR Image Guided Suprapubic Catheter Placement.  IR consulted for possible suprapubic catheter placement. Case has been reviewed and procedure approved by Dr. Miles Costain. Procedure discussed with patient at bedside. Patient given the option of performing the procedure today under 1 medication sedation or tomorrow under conscious sedation. Patient made aware that delay in treatment could result in worsening medical condition. Patient elected for the procedure to be performed on 3.12.25 using conscious sedation. Per Patient request tentatively scheduled for 3.12.25.  Team instructed to: Keep Patient to be NPO after midnight IR will call patient when ready.  Team should consult IR should patient condition change for emergent procedure.   Risks and benefits discussed with the patient including bleeding, infection, damage to adjacent structures, bowel perforation/fistula connection, and sepsis.  All of the patient's questions were answered, patient is agreeable to proceed. Consent signed and in chart.  Thank you for this interesting consult.  I greatly enjoyed meeting Matthew Mckay and look forward to participating in their care.  A copy of this report was sent to the requesting provider on this date.  Electronically Signed: Alene Mires, NP 06/29/2023, 3:45 PM   I spent a total of 40 Minutes    in face to face in clinical  consultation, greater than 50% of which was counseling/coordinating care for suprapubic catheter placement

## 2023-06-30 ENCOUNTER — Inpatient Hospital Stay (HOSPITAL_COMMUNITY)

## 2023-06-30 DIAGNOSIS — A419 Sepsis, unspecified organism: Secondary | ICD-10-CM | POA: Diagnosis not present

## 2023-06-30 LAB — PROTIME-INR
INR: 1.1 (ref 0.8–1.2)
Prothrombin Time: 14.1 s (ref 11.4–15.2)

## 2023-06-30 LAB — CBC
HCT: 36.9 % — ABNORMAL LOW (ref 39.0–52.0)
Hemoglobin: 12.2 g/dL — ABNORMAL LOW (ref 13.0–17.0)
MCH: 29.5 pg (ref 26.0–34.0)
MCHC: 33.1 g/dL (ref 30.0–36.0)
MCV: 89.1 fL (ref 80.0–100.0)
Platelets: 148 10*3/uL — ABNORMAL LOW (ref 150–400)
RBC: 4.14 MIL/uL — ABNORMAL LOW (ref 4.22–5.81)
RDW: 13.7 % (ref 11.5–15.5)
WBC: 14.3 10*3/uL — ABNORMAL HIGH (ref 4.0–10.5)
nRBC: 0 % (ref 0.0–0.2)

## 2023-06-30 LAB — PHOSPHORUS: Phosphorus: 2.2 mg/dL — ABNORMAL LOW (ref 2.5–4.6)

## 2023-06-30 LAB — BASIC METABOLIC PANEL
Anion gap: 4 — ABNORMAL LOW (ref 5–15)
BUN: 27 mg/dL — ABNORMAL HIGH (ref 8–23)
CO2: 24 mmol/L (ref 22–32)
Calcium: 8.1 mg/dL — ABNORMAL LOW (ref 8.9–10.3)
Chloride: 110 mmol/L (ref 98–111)
Creatinine, Ser: 1.2 mg/dL (ref 0.61–1.24)
GFR, Estimated: 60 mL/min — ABNORMAL LOW (ref >60)
Glucose, Bld: 139 mg/dL — ABNORMAL HIGH (ref 70–99)
Potassium: 4 mmol/L (ref 3.5–5.1)
Sodium: 138 mmol/L (ref 135–145)

## 2023-06-30 LAB — URINE CULTURE: Culture: NO GROWTH

## 2023-06-30 LAB — MAGNESIUM: Magnesium: 1.9 mg/dL (ref 1.7–2.4)

## 2023-06-30 MED ORDER — HYDRALAZINE HCL 20 MG/ML IJ SOLN
10.0000 mg | Freq: Four times a day (QID) | INTRAMUSCULAR | Status: DC | PRN
  Administered 2023-06-30 – 2023-07-02 (×2): 10 mg via INTRAVENOUS
  Filled 2023-06-30 (×2): qty 1

## 2023-06-30 MED ORDER — LISINOPRIL 20 MG PO TABS
20.0000 mg | ORAL_TABLET | Freq: Every day | ORAL | Status: DC
  Administered 2023-06-30 – 2023-07-02 (×3): 20 mg via ORAL
  Filled 2023-06-30 (×3): qty 1

## 2023-06-30 MED ORDER — MIDAZOLAM HCL 2 MG/2ML IJ SOLN
INTRAMUSCULAR | Status: AC | PRN
  Administered 2023-06-30: 1 mg via INTRAVENOUS

## 2023-06-30 MED ORDER — DICLOFENAC SODIUM 1 % EX GEL
2.0000 g | Freq: Four times a day (QID) | CUTANEOUS | Status: DC
  Administered 2023-06-30 – 2023-07-02 (×9): 2 g via TOPICAL
  Filled 2023-06-30: qty 100

## 2023-06-30 MED ORDER — MIDAZOLAM HCL 2 MG/2ML IJ SOLN
INTRAMUSCULAR | Status: AC
  Filled 2023-06-30: qty 2

## 2023-06-30 MED ORDER — MENTHOL 3 MG MT LOZG
1.0000 | LOZENGE | OROMUCOSAL | Status: DC | PRN
Start: 2023-06-30 — End: 2023-07-03
  Administered 2023-06-30: 3 mg via ORAL
  Filled 2023-06-30: qty 9

## 2023-06-30 MED ORDER — SODIUM CHLORIDE 0.9% FLUSH
5.0000 mL | Freq: Three times a day (TID) | INTRAVENOUS | Status: DC
  Administered 2023-06-30 – 2023-07-02 (×7): 5 mL

## 2023-06-30 MED ORDER — FENTANYL CITRATE (PF) 100 MCG/2ML IJ SOLN
INTRAMUSCULAR | Status: AC
  Filled 2023-06-30: qty 2

## 2023-06-30 NOTE — Progress Notes (Signed)
 PROGRESS NOTE    Matthew Mckay  ZOX:[id number] DOB: 09-11-1940 DOA: 06/27/2023 PCP: Jac Canavan, PA-C    Brief Narrative:   Matthew Mckay is a 83 y.o. male with past medical history significant of GERD, HTN, HLD, hx of prostate cancer, atrial fibrillation, DDD, BPH with hx of urinary retention  presented to ED after a fall. In ED his workup significant for sepsis, secondary to bilateral pyelonephritis, as well he had evidence of urinary retention, patient was then admitted hospital for further evaluation and treatment.  Assessment & Plan:   Principal Problem:   Sepsis (HCC) Active Problems:   Rhabdomyolysis   Elevated troponin   Atrial fibrillation (HCC)   Essential hypertension   GERD (gastroesophageal reflux disease)   Hyperlipidemia   History of prostate cancer   Tremor   Urinary retention   Urethral stricture  Sepsis (HCC)/secondary to UTI with cystitis and bilateral perinephritis Continue Rocephin IV fluids.  Blood cultures negative in 3 days.  Procalcitonin elevated at 8.29.  Urine culture negative growth so far.  Urinary retention with urinary strictures. History of prostate cancer Unable to put Foley catheter also IR has been consulted for suprapubic catheter placement.   Rhabdomyolysis Continue IV fluids.  CK trending down.  Hold Lipitor.  Latest CK level of 1051.  Continue to monitor closely.   Elevated troponin Troponin flat, no chest pain. Likely demand ischemia in setting of sepsis/afib  Continue tele monitoring    Atrial fibrillation (HCC) CHADS2VASC score of 3.  S/p DCCV on 09/02/22  Off Eliquis due to hematuria, multiple falls, recurrent hemorrhagic cystitis, risks outweighed benefits.  This was discussed with the family by previous provider.  Anticoagulation has been discontinued.   Essential hypertension Continue Toprol-XL.   GERD (gastroesophageal reflux disease) Continue Pepcid.   Hyperlipidemia Hold Lipitor in the context of  rhabdomyolysis.   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 Berneice Heinrich, this post total resection, and radiation, 1999, status post TURP in 2005 as discussed with urology, developed radiation cystitis, patient recurrent hemorrhagic cystitis .  Plan for suprapubic catheter placement by IR.   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  Potassium of 4.0.  hypophosphatemia Replenished.  Phosphorus is slightly low will continue to replenish.   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 -recommend follow-up MRI in 2 years per radiology.   DVT prophylaxis: subcu heparin   Code Status: Full code  Family Communication:  Previous provider had spoken with the patient's daughter on 06/29/2023.  Phone number [phone]  Disposition:   Status is: Inpatient   Consultants:  none   Subjective:  Today, patient was seen and examined at bedside.  Complains of dry mouth and neck pain.  Denies any shortness of breath fever chills or rigor.  Awaiting for IR guided suprapubic catheter placement.  Objective: Vitals:   06/29/23 2351 06/30/23 0006 06/30/23 0400 06/30/23 0753  BP: (!) 146/83 (!) 166/95 (!) 165/100 (!) 181/90  Pulse: 83  88 77  Resp: 20  20 16   Temp: 99.7 F (37.6 C)  100.2 F (37.9 C) 98.8 F (37.1 C)  TempSrc: Oral  Oral Oral  SpO2: 93%  98% 97%  Weight:      Height:        Intake/Output Summary (Last 24 hours) at 06/30/2023 1025 Last data filed at 06/30/2023 0518 Gross per 24 hour  Intake 610 ml  Output 850 ml  Net -  240 ml   Filed Weights   06/27/23 1221  Weight: 74.8 kg    Physical examination:  General:  Average built, not in obvious distress, appears frail, left forehead with bruise. HENT:   No scleral pallor or icterus noted. Oral mucosa is moist.  Chest:  Clear breath sounds.  Diminished breath sounds bilaterally.  CVS: S1 &S2 heard. No murmur.   Regular rate and rhythm. Abdomen: Soft, nontender, nondistended.  Suprapubic fullness noted.  Bowel sounds are heard.   Extremities: No cyanosis, clubbing or edema.  Peripheral pulses are palpable. Psych: Alert, awake and oriented, normal mood CNS:  No cranial nerve deficits.  Moves all extremities. Skin: Warm and dry.  No rashes noted.   Data Reviewed: I have personally reviewed following labs and imaging studies  CBC: Recent Labs  Lab 06/27/23 1217 06/27/23 1226 06/28/23 0444 06/29/23 0424 06/30/23 0502  WBC 17.9*  --  21.4* 16.5* 14.3*  HGB 13.8 14.3 12.5* 11.9* 12.2*  HCT 42.8 42.0 38.4* 35.6* 36.9*  MCV 91.8  --  91.0 87.7 89.1  PLT 165  --  161 164 148*    Basic Metabolic Panel: Recent Labs  Lab 06/27/23 1217 06/27/23 1226 06/27/23 1515 06/28/23 0444 06/29/23 0424 06/30/23 0502  NA 140 141  --  140 141 138  K 3.5 3.7  --  3.4* 3.8 4.0  CL 108 109  --  112* 111 110  CO2 22  --   --  20* 23 24  GLUCOSE 186* 175*  --  147* 135* 139*  BUN 31* 39*  --  31* 28* 27*  CREATININE 1.23 1.10  --  1.14 0.88 1.20  CALCIUM 9.4  --   --  8.3* 8.2* 8.1*  MG 1.8  --   --  1.7 2.1 1.9  PHOS  --   --  2.4* 2.2* 2.7 2.2*    GFR: Estimated Creatinine Clearance: 45.8 mL/min (by C-G formula based on SCr of 1.2 mg/dL).  Liver Function Tests: Recent Labs  Lab 06/27/23 1217 06/28/23 0444  AST 32 69*  ALT 14 26  ALKPHOS 34* 34*  BILITOT 1.0 0.7  PROT 6.5 5.8*  ALBUMIN 2.7* 2.3*    CBG: Recent Labs  Lab 06/28/23 0756  GLUCAP 155*     Recent Results (from the past 240 hours)  Resp panel by RT-PCR (RSV, Flu A&B, Covid) Anterior Nasal Swab     Status: None   Collection Time: 06/27/23 12:21 PM   Specimen: Anterior Nasal Swab  Result Value Ref Range Status   SARS Coronavirus 2 by RT PCR NEGATIVE NEGATIVE Final   Influenza A by PCR NEGATIVE NEGATIVE Final   Influenza B by PCR NEGATIVE NEGATIVE Final    Comment: (NOTE) The Xpert Xpress SARS-CoV-2/FLU/RSV plus assay  is intended as an aid in the diagnosis of influenza from Nasopharyngeal swab specimens and should not be used as a sole basis for treatment. Nasal washings and aspirates are unacceptable for Xpert Xpress SARS-CoV-2/FLU/RSV testing.  Fact Sheet for Patients: BloggerCourse.com  Fact Sheet for Healthcare Providers: SeriousBroker.it  This test is not yet approved or cleared by the [place] FDA and has been authorized for detection and/or diagnosis of SARS-CoV-2 by FDA under an Emergency Use Authorization (EUA). This EUA will remain in effect (meaning this test can be used) for the duration of the COVID-19 declaration under Section 564(b)(1) of the Act, 21 U.S.C. section 360bbb-3(b)(1), unless the authorization is terminated or revoked.     Resp  Syncytial Virus by PCR NEGATIVE NEGATIVE Final    Comment: (NOTE) Fact Sheet for Patients: BloggerCourse.com  Fact Sheet for Healthcare Providers: SeriousBroker.it  This test is not yet approved or cleared by the [place] FDA and has been authorized for detection and/or diagnosis of SARS-CoV-2 by FDA under an Emergency Use Authorization (EUA). This EUA will remain in effect (meaning this test can be used) for the duration of the COVID-19 declaration under Section 564(b)(1) of the Act, 21 U.S.C. section 360bbb-3(b)(1), unless the authorization is terminated or revoked.  Performed at [facility], [address] [place], [place] [address]   Blood Culture (routine x 2)     Status: None (Preliminary result)   Collection Time: 06/27/23  2:32 PM   Specimen: BLOOD  Result Value Ref Range Status   Specimen Description BLOOD SITE NOT SPECIFIED  Final   Special Requests   Final    BOTTLES DRAWN AEROBIC ONLY Blood Culture results may not be optimal due to an inadequate volume of blood received in culture bottles   Culture   Final     NO GROWTH 3 DAYS Performed at [facility], [address] [place], [place] [address]    Report Status PENDING  Incomplete  Blood Culture (routine x 2)     Status: None (Preliminary result)   Collection Time: 06/27/23  2:37 PM   Specimen: BLOOD  Result Value Ref Range Status   Specimen Description BLOOD SITE NOT SPECIFIED  Final   Special Requests   Final    BOTTLES DRAWN AEROBIC ONLY Blood Culture results may not be optimal due to an inadequate volume of blood received in culture bottles   Culture   Final    NO GROWTH 3 DAYS Performed at [facility], [address] [place], [place] [address]    Report Status PENDING  Incomplete  Urine culture     Status: Abnormal   Collection Time: 06/27/23  3:12 PM   Specimen: Urine, Clean Catch  Result Value Ref Range Status   Specimen Description URINE, CLEAN CATCH  Final   Special Requests   Final    NONE Performed at [facility], [address] [place], [place] [address]    Culture MULTIPLE SPECIES PRESENT, SUGGEST RECOLLECTION (A)  Final   Report Status 06/28/2023 FINAL  Final  Urine Culture (for pregnant, neutropenic or urologic patients or patients with an indwelling urinary catheter)     Status: None   Collection Time: 06/28/23  2:41 PM   Specimen: Urine, Clean Catch  Result Value Ref Range Status   Specimen Description URINE, CLEAN CATCH  Final   Special Requests NONE  Final   Culture   Final    NO GROWTH Performed at [facility], [address], [place], [place] [address]    Report Status 06/30/2023 FINAL  Final      Radiology Studies: No results found.   Scheduled Meds:  feeding supplement  237 mL Oral BID BM   heparin injection (subcutaneous)  5,000 Units Subcutaneous Q8H   metoprolol succinate  25 mg Oral Daily   Continuous Infusions:  cefTRIAXone (ROCEPHIN)  IV 2 g (06/30/23 0945)     LOS: 3 days   Joycelyn Das, MD [facility]  06/30/2023, 10:25 AM

## 2023-06-30 NOTE — Progress Notes (Signed)
   06/30/23 0518  External Urinary Catheter  Placement Date/Time: 06/28/23 2000   External Urinary Catheter Type: Male External Catheter with Suction  Pubic hair clipped (verbal consent obtained): N/A  Output (mL) 450 mL  Urine Characteristics  Urinary Incontinence Yes  Urine Color Amber  Urine Appearance Cloudy  Urinary Interventions Bladder scan post voided, residual urine  Bladder Scan Volume (mL) 645 mL  Hygiene Peri care   Filiberto Pinks, RN

## 2023-06-30 NOTE — Procedures (Signed)
 Interventional Radiology Procedure Note  Procedure: CT guided suprapubic catheter placement  Findings: Please refer to procedural dictation for full description. 16 Fr pigtail.  Complications: None immediate  Estimated Blood Loss: < 5 ml  Recommendations: Keep to bag drainage. IR will follow.   Marliss Coots, MD

## 2023-06-30 NOTE — Progress Notes (Signed)
 Occupational Therapy Treatment Patient Details Name: Matthew Mckay MRN: [id number] DOB: [birth year] Today's Date: 06/30/2023   History of present illness Pt is an 83 y/o M presenting to ED on 3/10 after fall, down x4 hours admitted for sepsis 2/2 leukocytosis, tachycardia, UTI, also with rhabdomyolysis. PMH includes GERD, HTN, HLD, prostate CA, A fib, DDD, BPH   OT comments  Pt progressing toward goals this session, feeling fatigued since he is NPO for procedure today. Pt able to perform bed mobility, simulated toilet transfer and LB ADL with min-mod A. VSS on RA throughout session and pt denies SOB. Pt presenting with impairments listed below, will follow acutely. Recommend HHOT if pt can return home with assist for ADL/IADL, however if unable patient will benefit from continued inpatient follow up therapy, <3 hours/day.       If plan is discharge home, recommend the following:  A lot of help with walking and/or transfers;A lot of help with bathing/dressing/bathroom;Assistance with cooking/housework;Assistance with feeding;Assist for transportation;Direct supervision/assist for financial management;Direct supervision/assist for medications management   Equipment Recommendations  Tub/shower seat;Other (comment);BSC/3in1 (RW)    Recommendations for Other Services PT consult    Precautions / Restrictions Precautions Precautions: Fall Recall of Precautions/Restrictions: Intact Restrictions Weight Bearing Restrictions Per Provider Order: No       Mobility Bed Mobility Overal bed mobility: Needs Assistance Bed Mobility: Supine to Sit, Sit to Supine     Supine to sit: Min assist Sit to supine: Contact guard assist        Transfers Overall transfer level: Needs assistance Equipment used: Rolling walker (2 wheels) Transfers: Sit to/from Stand Sit to Stand: Min assist                 Balance Overall balance assessment: Needs assistance Sitting-balance support: Feet  supported Sitting balance-Leahy Scale: Good Sitting balance - Comments: reaches outside BOS without LOB   Standing balance support: During functional activity, Reliant on assistive device for balance Standing balance-Leahy Scale: Fair                             ADL either performed or assessed with clinical judgement   ADL Overall ADL's : Needs assistance/impaired                     Lower Body Dressing: Moderate assistance   Toilet Transfer: Minimal assistance;Ambulation;Rolling walker (2 wheels)           Functional mobility during ADLs: Minimal assistance;Rolling walker (2 wheels)      Extremity/Trunk Assessment Upper Extremity Assessment Upper Extremity Assessment: Generalized weakness   Lower Extremity Assessment Lower Extremity Assessment: Defer to PT evaluation        Vision   Vision Assessment?: No apparent visual deficits   Perception Perception Perception: Not tested   Praxis Praxis Praxis: Not tested   Communication Communication Communication: No apparent difficulties   Cognition Arousal: Alert Behavior During Therapy: WFL for tasks assessed/performed Cognition: Cognition impaired     Awareness: Online awareness impaired Memory impairment (select all impairments): Short-term memory, Working memory Attention impairment (select first level of impairment): Selective attention, Alternating attention, Divided attention Executive functioning impairment (select all impairments): Initiation OT - Cognition Comments: incr time and multiple cues to initiate bed mobility tasks, aware of why he is in the hospital and date                 Following commands: Intact  Cueing   Cueing Techniques: Verbal cues, Gestural cues  Exercises      Shoulder Instructions       General Comments VSS on RA    Pertinent Vitals/ Pain       Pain Assessment Pain Assessment: Faces Pain Score: 4  Faces Pain Scale: Hurts little  more Pain Location: L side of neck Pain Descriptors / Indicators: Discomfort Pain Intervention(s): Limited activity within patient's tolerance, Monitored during session, Repositioned  Home Living                                          Prior Functioning/Environment              Frequency  Min 1X/week        Progress Toward Goals  OT Goals(current goals can now be found in the care plan section)  Progress towards OT goals: Progressing toward goals  Acute Rehab OT Goals Patient Stated Goal: none stated OT Goal Formulation: With patient Time For Goal Achievement: 07/12/23 Potential to Achieve Goals: Good ADL Goals Pt Will Perform Upper Body Dressing: with min assist;sitting Pt Will Perform Lower Body Dressing: with min assist;sit to/from stand;sitting/lateral leans Pt Will Transfer to Toilet: with min assist;ambulating;regular height toilet Additional ADL Goal #1: pt will perform bed mobility with supervision in prep for OOB activity.  Plan      Co-evaluation                 AM-PAC OT "6 Clicks" Daily Activity     Outcome Measure   Help from another person eating meals?: A Little Help from another person taking care of personal grooming?: A Little Help from another person toileting, which includes using toliet, bedpan, or urinal?: A Lot Help from another person bathing (including washing, rinsing, drying)?: A Lot Help from another person to put on and taking off regular upper body clothing?: A Lot Help from another person to put on and taking off regular lower body clothing?: A Lot 6 Click Score: 14    End of Session Equipment Utilized During Treatment: Gait belt;Rolling walker (2 wheels);Oxygen  OT Visit Diagnosis: Unsteadiness on feet (R26.81);Other abnormalities of gait and mobility (R26.89);Muscle weakness (generalized) (M62.81);History of falling (Z91.81);Other symptoms and signs involving the nervous system (R29.898)   Activity  Tolerance Patient tolerated treatment well   Patient Left in bed;with call bell/phone within reach;with bed alarm set (pt with plans to go for procedure ?this AM)   Nurse Communication Mobility status        Time: 1610-9604 OT Time Calculation (min): 30 min  Charges: OT General Charges $OT Visit: 1 Visit OT Treatments $Self Care/Home Management : 8-22 mins $Therapeutic Activity: 8-22 mins  Carver Fila, OTD, OTR/L SecureChat Preferred Acute Rehab [phone]    Dalphine Handing 06/30/2023, 9:14 AM

## 2023-06-30 NOTE — Progress Notes (Signed)
 PT Cancellation Note  Patient Details Name: Matthew Mckay MRN: [id number] DOB: 06-03-1940   Cancelled Treatment:     Pt politely refused therapy services this AM, stating need for rest after OT treatment and awaiting procedure today.     Lucio Edward 06/30/2023, 11:15 AM

## 2023-07-01 DIAGNOSIS — A419 Sepsis, unspecified organism: Secondary | ICD-10-CM | POA: Diagnosis not present

## 2023-07-01 LAB — CBC
HCT: 36 % — ABNORMAL LOW (ref 39.0–52.0)
Hemoglobin: 11.9 g/dL — ABNORMAL LOW (ref 13.0–17.0)
MCH: 29.5 pg (ref 26.0–34.0)
MCHC: 33.1 g/dL (ref 30.0–36.0)
MCV: 89.3 fL (ref 80.0–100.0)
Platelets: 182 10*3/uL (ref 150–400)
RBC: 4.03 MIL/uL — ABNORMAL LOW (ref 4.22–5.81)
RDW: 13.6 % (ref 11.5–15.5)
WBC: 13.4 10*3/uL — ABNORMAL HIGH (ref 4.0–10.5)
nRBC: 0 % (ref 0.0–0.2)

## 2023-07-01 LAB — BASIC METABOLIC PANEL
Anion gap: 6 (ref 5–15)
BUN: 26 mg/dL — ABNORMAL HIGH (ref 8–23)
CO2: 25 mmol/L (ref 22–32)
Calcium: 8.2 mg/dL — ABNORMAL LOW (ref 8.9–10.3)
Chloride: 111 mmol/L (ref 98–111)
Creatinine, Ser: 0.97 mg/dL (ref 0.61–1.24)
GFR, Estimated: >60 mL/min (ref >60)
Glucose, Bld: 118 mg/dL — ABNORMAL HIGH (ref 70–99)
Potassium: 3.8 mmol/L (ref 3.5–5.1)
Sodium: 142 mmol/L (ref 135–145)

## 2023-07-01 LAB — GLUCOSE, CAPILLARY: Glucose-Capillary: 167 mg/dL — ABNORMAL HIGH (ref 70–99)

## 2023-07-01 LAB — PHOSPHORUS: Phosphorus: 2.5 mg/dL (ref 2.5–4.6)

## 2023-07-01 LAB — MAGNESIUM: Magnesium: 1.9 mg/dL (ref 1.7–2.4)

## 2023-07-01 MED ORDER — LISINOPRIL 20 MG PO TABS: 20.0000 mg | ORAL_TABLET | Freq: Every day | ORAL | Status: DC

## 2023-07-01 MED ORDER — POLYETHYLENE GLYCOL 3350 17 G PO PACK
17.0000 g | PACK | Freq: Every day | ORAL | Status: DC
  Administered 2023-07-01 – 2023-07-02 (×2): 17 g via ORAL
  Filled 2023-07-01 (×2): qty 1

## 2023-07-01 MED ORDER — DOCUSATE SODIUM 100 MG PO CAPS
100.0000 mg | ORAL_CAPSULE | Freq: Two times a day (BID) | ORAL | Status: DC
  Administered 2023-07-01 – 2023-07-02 (×3): 100 mg via ORAL
  Filled 2023-07-01 (×3): qty 1

## 2023-07-01 NOTE — Progress Notes (Addendum)
 PROGRESS NOTE    Matthew Mckay  WUJ:[id number] DOB: Sep 07, 1940 DOA: 06/27/2023 PCP: Jac Canavan, PA-C    Brief Narrative:   Matthew Mckay is a 83 y.o. male with past medical history significant of GERD, HTN, hyperlipidemia hx of prostate cancer, atrial fibrillation, DDD, BPH with hx of urinary retention  presented to ED after a fall. In ED, his workup significant for sepsis, secondary to bilateral pyelonephritis, as well he had evidence of urinary retention, patient was then admitted hospital for further evaluation and treatment.  Assessment & Plan:   Principal Problem:   Sepsis (HCC) Active Problems:   Rhabdomyolysis   Elevated troponin   Atrial fibrillation (HCC)   Essential hypertension   GERD (gastroesophageal reflux disease)   Hyperlipidemia   History of prostate cancer   Tremor   Urinary retention   Urethral stricture  Sepsis secondary to UTI with cystitis and bilateral perinephritis Continue Rocephin, IV fluids.  Blood cultures negative in 4 days.  Initial Procalcitonin elevated at 8.29.  Urine culture negative growth so far.  Blood cultures negative.  Plan for 5-7-day course of antibiotics due to complicated UTI including strictures need for suprapubic catheter.  Still with mild leukocytosis but no fever.  Will continue to trend.  Urinary retention with urinary strictures. History of prostate cancer Unable to put Foley catheter also IR was consulted for suprapubic catheter placement.  Status post suprapubic catheter placement 06/30/2023.  Has been having good urinary output.   Rhabdomyolysis Continue IV fluids.  CK trending down.  Hold Lipitor.  Latest CK level of 1051.  Has been having good urinary output through suprapubic catheter.  Check CK levels in AM.   Elevated troponin Troponin flat, no chest pain. Likely demand ischemia in setting of sepsis/afib.  No further workup planned.   Atrial fibrillation  CHADS2VASC score of 3.  Status post  DCCV on 09/02/22.  Off Eliquis due to hematuria, multiple falls, recurrent hemorrhagic cystitis, risks outweighed benefits.  This was discussed with the family by previous provider.  Anticoagulation has been discontinued.   Essential hypertension Continue Toprol-XL.  Restart lisinopril since blood pressure is elevated today and and renal function has stabilized.   GERD (gastroesophageal reflux disease) Continue Pepcid.   Hyperlipidemia Hold Lipitor in the context of rhabdomyolysis.  Check CK levels in AM.  Zoom if CK level okay.   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 Berneice Heinrich, this post total resection, and radiation, 1999, status post TURP in 2005 as discussed with urology, developed radiation cystitis, patient recurrent hemorrhagic cystitis.  status post suprapubic catheter placement by IR.   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  Potassium of 3.8  hypophosphatemia Replenished.  Phosphorus improved.   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 Recommend follow-up MRI in 2 years per radiology.  Debility, deconditioning. Seen by physical therapy and recommend skilled nursing facility placement.  DVT prophylaxis: subcu heparin   Code Status: Full code  Family Communication:  Spoke with patient's daughter in law Dr Fonda Kinder on 07/01/2023 and updated her about the clinical condition of the patient.Isac Caddy phone number [phone]  Disposition:   Status is: Inpatient   Consultants:  none  Subjective:  Today, patient was seen and examined at bedside.  Feels anxious on the account that he is home on its own.  Feels insecure.  Denies any fever, chills or rigor.  Denies any nausea vomiting or  shortness of breath.    Objective: Vitals:   07/01/23 0403 07/01/23 0800 07/01/23 0808 07/01/23 1200  BP: (!) 143/83  (!) 140/72 (!) 155/101  Pulse: 77  90 70  Resp: 13  17 20   Temp:  98.8 F (37.1 C) 97.8 F (36.6 C) 97.8 F (36.6 C) 98.4 F (36.9 C)  TempSrc: Oral Oral Oral Oral  SpO2: 99%  95% 94%  Weight:      Height:        Intake/Output Summary (Last 24 hours) at 07/01/2023 1408 Last data filed at 07/01/2023 0800 Gross per 24 hour  Intake 370 ml  Output 1000 ml  Net -630 ml   Filed Weights   06/27/23 1221  Weight: 74.8 kg    Physical examination:  General:  Average built, not in obvious distress, appears frail, left forehead with bruise.  Mildly anxious HENT:   No scleral pallor or icterus noted. Oral mucosa is moist.  Chest:  Clear breath sounds.   CVS: S1 &S2 heard. No murmur.  Regular rate and rhythm. Abdomen: Soft, nontender, nondistended.  Suprapubic catheter in place.  Bowel sounds are heard.   Extremities: No cyanosis, clubbing or edema.  Peripheral pulses are palpable. Psych: Alert, awake and oriented, mildly anxious. CNS:  No cranial nerve deficits.  Moves all extremities. Skin: Warm and dry.  No rashes noted.   Data Reviewed: I have personally reviewed following labs and imaging studies  CBC: Recent Labs  Lab 06/27/23 1217 06/27/23 1226 06/28/23 0444 06/29/23 0424 06/30/23 0502 07/01/23 0440  WBC 17.9*  --  21.4* 16.5* 14.3* 13.4*  HGB 13.8 14.3 12.5* 11.9* 12.2* 11.9*  HCT 42.8 42.0 38.4* 35.6* 36.9* 36.0*  MCV 91.8  --  91.0 87.7 89.1 89.3  PLT 165  --  161 164 148* 182    Basic Metabolic Panel: Recent Labs  Lab 06/27/23 1217 06/27/23 1226 06/27/23 1515 06/28/23 0444 06/29/23 0424 06/30/23 0502 07/01/23 0440  NA 140 141  --  140 141 138 142  K 3.5 3.7  --  3.4* 3.8 4.0 3.8  CL 108 109  --  112* 111 110 111  CO2 22  --   --  20* 23 24 25   GLUCOSE 186* 175*  --  147* 135* 139* 118*  BUN 31* 39*  --  31* 28* 27* 26*  CREATININE 1.23 1.10  --  1.14 0.88 1.20 0.97  CALCIUM 9.4  --   --  8.3* 8.2* 8.1* 8.2*  MG 1.8  --   --  1.7 2.1 1.9 1.9  PHOS  --   --  2.4* 2.2* 2.7 2.2* 2.5    GFR: Estimated Creatinine  Clearance: 56.7 mL/min (by C-G formula based on SCr of 0.97 mg/dL).  Liver Function Tests: Recent Labs  Lab 06/27/23 1217 06/28/23 0444  AST 32 69*  ALT 14 26  ALKPHOS 34* 34*  BILITOT 1.0 0.7  PROT 6.5 5.8*  ALBUMIN 2.7* 2.3*    CBG: Recent Labs  Lab 06/28/23 0756  GLUCAP 155*     Recent Results (from the past 240 hours)  Resp panel by RT-PCR (RSV, Flu A&B, Covid) Anterior Nasal Swab     Status: None   Collection Time: 06/27/23 12:21 PM   Specimen: Anterior Nasal Swab  Result Value Ref Range Status   SARS Coronavirus 2 by RT PCR NEGATIVE NEGATIVE Final   Influenza A by PCR NEGATIVE NEGATIVE Final   Influenza B by PCR NEGATIVE NEGATIVE Final  Comment: (NOTE) The Xpert Xpress SARS-CoV-2/FLU/RSV plus assay is intended as an aid in the diagnosis of influenza from Nasopharyngeal swab specimens and should not be used as a sole basis for treatment. Nasal washings and aspirates are unacceptable for Xpert Xpress SARS-CoV-2/FLU/RSV testing.  Fact Sheet for Patients: BloggerCourse.com  Fact Sheet for Healthcare Providers: SeriousBroker.it  This test is not yet approved or cleared by the [place] FDA and has been authorized for detection and/or diagnosis of SARS-CoV-2 by FDA under an Emergency Use Authorization (EUA). This EUA will remain in effect (meaning this test can be used) for the duration of the COVID-19 declaration under Section 564(b)(1) of the Act, 21 U.S.C. section 360bbb-3(b)(1), unless the authorization is terminated or revoked.     Resp Syncytial Virus by PCR NEGATIVE NEGATIVE Final    Comment: (NOTE) Fact Sheet for Patients: BloggerCourse.com  Fact Sheet for Healthcare Providers: SeriousBroker.it  This test is not yet approved or cleared by the [place] FDA and has been authorized for detection and/or diagnosis of SARS-CoV-2 by FDA under an  Emergency Use Authorization (EUA). This EUA will remain in effect (meaning this test can be used) for the duration of the COVID-19 declaration under Section 564(b)(1) of the Act, 21 U.S.C. section 360bbb-3(b)(1), unless the authorization is terminated or revoked.  Performed at [facility], [address] [place], [place] [address]   Blood Culture (routine x 2)     Status: None (Preliminary result)   Collection Time: 06/27/23  2:32 PM   Specimen: BLOOD  Result Value Ref Range Status   Specimen Description BLOOD SITE NOT SPECIFIED  Final   Special Requests   Final    BOTTLES DRAWN AEROBIC ONLY Blood Culture results may not be optimal due to an inadequate volume of blood received in culture bottles   Culture   Final    NO GROWTH 4 DAYS Performed at [facility], [address] [place], [place] [address]    Report Status PENDING  Incomplete  Blood Culture (routine x 2)     Status: None (Preliminary result)   Collection Time: 06/27/23  2:37 PM   Specimen: BLOOD  Result Value Ref Range Status   Specimen Description BLOOD SITE NOT SPECIFIED  Final   Special Requests   Final    BOTTLES DRAWN AEROBIC ONLY Blood Culture results may not be optimal due to an inadequate volume of blood received in culture bottles   Culture   Final    NO GROWTH 4 DAYS Performed at [facility], [address], [place], [place] [address]    Report Status PENDING  Incomplete  Urine culture     Status: Abnormal   Collection Time: 06/27/23  3:12 PM   Specimen: Urine, Clean Catch  Result Value Ref Range Status   Specimen Description URINE, CLEAN CATCH  Final   Special Requests   Final    NONE Performed at [facility], [address], [place], [place] [address]    Culture MULTIPLE SPECIES PRESENT, SUGGEST RECOLLECTION (A)  Final   Report Status 06/28/2023 FINAL  Final  Urine Culture (for pregnant, neutropenic or urologic patients or patients with an indwelling urinary catheter)      Status: None   Collection Time: 06/28/23  2:41 PM   Specimen: Urine, Clean Catch  Result Value Ref Range Status   Specimen Description URINE, CLEAN CATCH  Final   Special Requests NONE  Final   Culture  Final    NO GROWTH Performed at [facility], 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 EXAM: CT and ultrasound guided placement of suprapubic catheter COMPARISON:  06/27/2023 MEDICATIONS: The patient is currently admitted to the hospital and receiving intravenous antibiotics. The antibiotics were administered within an appropriate time frame prior to the initiation of the procedure. ANESTHESIA/SEDATION: Moderate (conscious) sedation was employed during this procedure. A total of Versed 2 mg and Fentanyl 100 mcg was administered intravenously. Moderate Sedation Time: 10 minutes. The patient's level of consciousness and vital signs were monitored continuously by radiology nursing throughout the procedure under my direct supervision. CONTRAST:  None COMPLICATIONS: None immediate. PROCEDURE: RADIATION DOSE REDUCTION: This exam was performed according to the departmental dose-optimization program which includes automated exposure control, adjustment of the mA and/or kV according to patient size and/or use of iterative reconstruction technique.Informed written consent was obtained from the patient after a discussion of the risks, benefits and alternatives to treatment. The patient was placed supine on the CT gantry and a pre procedural CT was performed which demonstrated a partially distended bladder delete that. The procedure was planned. A timeout was performed prior to the initiation of the procedure. The suprapubic region was prepped and draped in the usual sterile  fashion. The overlying soft tissues were anesthetized with 1% lidocaine with epinephrine. Appropriate trajectory was planned with the use of a 22 gauge spinal needle. An 18 gauge trocar needle was advanced into the urinary bladder and a short Amplatz super stiff wire was coiled within the bladder. The tract was serially dilated allowing placement of a 16 French all-purpose pigtail drainage catheter. Appropriate positioning was confirmed with a limited postprocedural CT scan. There was clear urine returned through the newly placed tube. The tube was connected to a drainage bag and sutured in place. A dressing was placed. The patient tolerated the procedure well without immediate post procedural complication. IMPRESSION: Successful CT guided placement of a 63 [place] French pigtail suprapubic catheter. Marliss Coots, MD [facility] Electronically Signed   By: Marliss Coots M.D.   On: 06/30/2023 16:43     Scheduled Meds:  diclofenac Sodium  2 g Topical QID   feeding supplement  237 mL Oral BID BM   heparin injection (subcutaneous)  5,000 Units Subcutaneous Q8H   lisinopril  20 mg Oral Daily   metoprolol succinate  25 mg Oral Daily   sodium chloride flush  5 mL Intracatheter Q8H   Continuous Infusions:  cefTRIAXone (ROCEPHIN)  IV 2 g (07/01/23 1046)     LOS: 4 days   Joycelyn Das, MD [facility]  07/01/2023, 2:08 PM

## 2023-07-01 NOTE — Plan of Care (Signed)
  Problem: Education: Goal: Knowledge of General Education information will improve Description: Including pain rating scale, medication(s)/side effects and non-pharmacologic comfort measures Outcome: Progressing   Problem: Health Behavior/Discharge Planning: Goal: Ability to manage health-related needs will improve Outcome: Progressing   Problem: Clinical Measurements: Goal: Ability to maintain clinical measurements within normal limits will improve Outcome: Progressing Goal: Will remain free from infection Outcome: Progressing Goal: Diagnostic test results will improve Outcome: Progressing Goal: Respiratory complications will improve Outcome: Progressing Goal: Cardiovascular complication will be avoided Outcome: Progressing   Problem: Activity: Goal: Risk for activity intolerance will decrease Outcome: Progressing   Problem: Nutrition: Goal: Adequate nutrition will be maintained Outcome: Progressing   Problem: Coping: Goal: Level of anxiety will decrease Outcome: Progressing   Problem: Elimination: Goal: Will not experience complications related to bowel motility Outcome: Progressing Goal: Will not experience complications related to urinary retention Outcome: Progressing   Problem: Pain Managment: Goal: General experience of comfort will improve and/or be controlled Outcome: Progressing   Problem: Safety: Goal: Ability to remain free from injury will improve Outcome: Progressing   Problem: Education: Goal: Knowledge of disease or condition will improve Outcome: Progressing Goal: Understanding of medication regimen will improve Outcome: Progressing Goal: Individualized Educational Video(s) Outcome: Progressing   Problem: Activity: Goal: Ability to tolerate increased activity will improve Outcome: Progressing   Problem: Cardiac: Goal: Ability to achieve and maintain adequate cardiopulmonary perfusion will improve Outcome: Progressing   Problem: Health  Behavior/Discharge Planning: Goal: Ability to safely manage health-related needs after discharge will improve Outcome: Progressing

## 2023-07-01 NOTE — [place] FL2 (Signed)
 [place] MEDICAID FL2 LEVEL OF CARE FORM     IDENTIFICATION  Patient Name: Matthew Mckay Birthdate: 24-Oct-1940 Sex: male Admission Date (Current Location): 06/27/2023  [facility] and [place] Number:  Producer, television/film/video and Address:  [facility], [address] [place], [place] [address]      Provider Number: [id number]  Attending Physician Name and Address:  Joycelyn Das, MD  Relative Name and Phone Number:       Current Level of Care: Hospital Recommended Level of Care: Skilled Nursing Facility Prior Approval Number:    Date Approved/Denied:   PASRR Number: [id number]  Discharge Plan: SNF    Current Diagnoses: Patient Active Problem List   Diagnosis Date Noted   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   Rhabdomyolysis 06/27/2023   Sepsis (HCC) 06/27/2023   Elevated troponin 06/27/2023   Abdominal discomfort 11/30/2022   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   Loose stools 11/30/2022   Flatulence 11/30/2022   Belching 11/30/2022   Memory change 11/30/2022   Other allergic rhinitis 11/30/2022   Vitamin deficiency 11/30/2022   Hypercoagulable state due to persistent atrial fibrillation (HCC) 08/31/2022   Atrial fibrillation (HCC) 07/23/2022   AKI (acute kidney injury) (HCC) 07/23/2022   Sinus bradycardia 07/22/2022   Renal cyst 05/22/2021   Abnormal CT of the abdomen 05/22/2021   Benign prostatic hyperplasia with post-void dribbling 02/13/2021   History of urinary retention 02/03/2021   Skin cancer 01/09/2021   External hemorrhoid 01/09/2021   Chronic allergic rhinitis 11/12/2020   Acute cystitis without hematuria 04/05/2020   Urinary incontinence 04/05/2020   Balanitis 04/05/2020   Chronic constipation 09/22/2019   Impaired fasting blood sugar 09/22/2019   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 (HCC) 09/22/2019   DDD (degenerative disc disease), cervical 09/22/2019   Diverticulosis 09/22/2019   Hearing decreased, right 09/22/2019    History of compression fracture of spine 05/26/2018   Corn of foot 05/26/2018   Urinary retention 05/26/2018   Tremor 05/26/2018   Hyperlipidemia 02/18/2017   High risk medication use 09/24/2016   Advanced directives, counseling/discussion 07/30/2016   Allergic conjunctivitis, bilateral 07/30/2016   Osteoarthritis of both knees 07/30/2016   GERD (gastroesophageal reflux disease) 08/08/2015   Essential hypertension 02/12/2015   Encounter for health maintenance examination in adult 02/12/2015   History of prostate cancer 02/12/2015    Orientation RESPIRATION BLADDER Height & Weight     Self, Situation, Time, Place  Normal Continent, Indwelling catheter Weight: 165 lb (74.8 kg) Height:  5\' 9"  (175.3 cm)  BEHAVIORAL SYMPTOMS/MOOD NEUROLOGICAL BOWEL NUTRITION STATUS      Incontinent Diet (see dc summary)  AMBULATORY STATUS COMMUNICATION OF NEEDS Skin   Limited Assist Verbally Other (Comment) (bruise on face)                       Personal Care Assistance Level of Assistance  Bathing, Feeding, Dressing Bathing Assistance: Limited assistance Feeding assistance: Independent Dressing Assistance: Limited assistance     Functional Limitations Info             SPECIAL CARE FACTORS FREQUENCY  PT (By licensed PT), OT (By licensed OT)     PT Frequency: 5x/week OT Frequency: 5x/week            Contractures Contractures Info: Not present    Additional Factors Info  Code Status, Allergies Code Status Info: Full Allergies Info: Pollen Extract-tree Extract (Pollen Extract)           Current  Medications (07/01/2023):  This is the current hospital active medication list Current Facility-Administered Medications  Medication Dose Route Frequency Provider Last Rate Last Admin   acetaminophen (TYLENOL) tablet 650 mg  650 mg Oral Q6H PRN Orland Mustard, MD   650 mg at 06/30/23 2310   Or   acetaminophen (TYLENOL) suppository 650 mg  650 mg Rectal Q6H PRN Orland Mustard, MD        cefTRIAXone (ROCEPHIN) 2 g in sodium chloride 0.9 % 100 mL IVPB  2 g Intravenous Q24H Orland Mustard, MD 200 mL/hr at 07/01/23 1046 2 g at 07/01/23 1046   diclofenac Sodium (VOLTAREN) 1 % topical gel 2 g  2 g Topical QID Pokhrel, Laxman, MD   2 g at 07/01/23 1043   docusate sodium (COLACE) capsule 100 mg  100 mg Oral BID Pokhrel, Laxman, MD       feeding supplement (ENSURE ENLIVE / ENSURE PLUS) liquid 237 mL  237 mL Oral BID BM Elgergawy, Leana Roe, MD   237 mL at 07/01/23 1043   heparin injection 5,000 Units  5,000 Units Subcutaneous Q8H Elgergawy, Leana Roe, MD   5,000 Units at 07/01/23 0445   hydrALAZINE (APRESOLINE) injection 10 mg  10 mg Intravenous Q6H PRN Pokhrel, Laxman, MD   10 mg at 06/30/23 1449   lisinopril (ZESTRIL) tablet 20 mg  20 mg Oral Daily Pokhrel, Laxman, MD   20 mg at 07/01/23 1042   menthol-cetylpyridinium (CEPACOL) lozenge 3 mg  1 lozenge Oral PRN Pokhrel, Laxman, MD   3 mg at 06/30/23 2049   metoprolol succinate (TOPROL-XL) 24 hr tablet 25 mg  25 mg Oral Daily Orland Mustard, MD   25 mg at 07/01/23 1042   ondansetron (ZOFRAN) tablet 4 mg  4 mg Oral Q6H PRN Orland Mustard, MD       Or   ondansetron [facility]) injection 4 mg  4 mg Intravenous Q6H PRN Orland Mustard, MD   4 mg at 06/28/23 2021   polyethylene glycol (MIRALAX / GLYCOLAX) packet 17 g  17 g Oral Daily Pokhrel, Laxman, MD       polyvinyl alcohol (LIQUIFILM TEARS) 1.4 % ophthalmic solution 2 drop  2 drop Both Eyes PRN Dow Adolph N, DO   2 drop at 07/01/23 1042   sodium chloride flush (NS) 0.9 % injection 5 mL  5 mL Intracatheter Q8H Suttle, Thressa Sheller, MD   5 mL at 07/01/23 0448     Discharge Medications: Please see discharge summary for a list of discharge medications.  Relevant Imaging Results:  Relevant Lab Results:   Additional Information SSn: [phone] [place], [place]

## 2023-07-01 NOTE — Progress Notes (Signed)
 Physical Therapy Treatment Patient Details Name: Matthew Mckay MRN: [id number] DOB: [birth year] Today's Date: 07/01/2023   History of Present Illness Pt is an 83 y/o M presenting to ED on 3/10 after fall, down x4 hours admitted for sepsis 2/2 leukocytosis, tachycardia, UTI, also with rhabdomyolysis. PMH includes GERD, HTN, HLD, prostate CA, A fib, DDD, BPH    PT Comments  Pt remains generally weak and is very anxious. Needs verbal cues to stay on task. Pt lives alone and when asked if anyone could stay with him at his home for several days post dc he doesn't think that is possible. At this time continues to need assist for basic mobility and feel patient will benefit from continued inpatient follow up therapy, <3 hours/day.      If plan is discharge home, recommend the following: A little help with walking and/or transfers;A little help with bathing/dressing/bathroom;Help with stairs or ramp for entrance;Assistance with cooking/housework   Can travel by private vehicle     Yes  Equipment Recommendations  Rolling walker (2 wheels);Rollator (4 wheels) (RW vs rollator)    Recommendations for Other Services       Precautions / Restrictions Precautions Precautions: Fall Recall of Precautions/Restrictions: Intact Restrictions Weight Bearing Restrictions Per Provider Order: No     Mobility  Bed Mobility Overal bed mobility: Needs Assistance Bed Mobility: Supine to Sit     Supine to sit: Min assist, HOB elevated     General bed mobility comments: Assist to elevate trunk into sitting    Transfers Overall transfer level: Needs assistance Equipment used: Rolling walker (2 wheels), Rollator (4 wheels) Transfers: Sit to/from Stand Sit to Stand: Min assist           General transfer comment: Assist to power up and for balance    Ambulation/Gait Ambulation/Gait assistance: Min assist Gait Distance (Feet): 150 Feet Assistive device: Rollator (4 wheels) Gait  Pattern/deviations: Step-through pattern, Decreased step length - right, Decreased step length - left, Shuffle, Wide base of support Gait velocity: decr Gait velocity interpretation: <1.8 ft/sec, indicate of risk for recurrent falls   General Gait Details: Assist for balance   Stairs             Wheelchair Mobility     Tilt Bed    Modified Rankin (Stroke Patients Only)       Balance Overall balance assessment: Needs assistance Sitting-balance support: Feet supported Sitting balance-Leahy Scale: Good     Standing balance support: During functional activity, Reliant on assistive device for balance Standing balance-Leahy Scale: Fair                              Hotel manager: No apparent difficulties  Cognition Arousal: Alert Behavior During Therapy: Anxious   PT - Cognitive impairments: Attention                       PT - Cognition Comments: Pt very anxious and repetitive speech. Needs cues to stay on task Following commands: Intact      Cueing Cueing Techniques: Verbal cues, Gestural cues  Exercises      General Comments General comments (skin integrity, edema, etc.): VSS on RA      Pertinent Vitals/Pain Pain Assessment Pain Assessment: Faces Faces Pain Scale: Hurts little more Pain Location: buttocks Pain Descriptors / Indicators: Discomfort, Sore Pain Intervention(s): Repositioned    Home Living  Prior Function            PT Goals (current goals can now be found in the care plan section) Progress towards PT goals: Progressing toward goals    Frequency    Min 1X/week      PT Plan      Co-evaluation              AM-PAC PT "6 Clicks" Mobility   Outcome Measure  Help needed turning from your back to your side while in a flat bed without using bedrails?: A Little Help needed moving from lying on your back to sitting on the side of a flat bed  without using bedrails?: A Little Help needed moving to and from a bed to a chair (including a wheelchair)?: A Little Help needed standing up from a chair using your arms (e.g., wheelchair or bedside chair)?: A Little Help needed to walk in hospital room?: A Little Help needed climbing 3-5 steps with a railing? : Total 6 Click Score: 16    End of Session Equipment Utilized During Treatment: Gait belt Activity Tolerance: Patient tolerated treatment well Patient left: in chair;with call bell/phone within reach;with bed alarm set Nurse Communication: Mobility status PT Visit Diagnosis: Unsteadiness on feet (R26.81);Other abnormalities of gait and mobility (R26.89);Muscle weakness (generalized) (M62.81);History of falling (Z91.81)     Time: 1914-7829 PT Time Calculation (min) (ACUTE ONLY): 35 min  Charges:    $Gait Training: 23-37 mins PT General Charges $$ ACUTE PT VISIT: 1 Visit                     [facility] PT Acute Rehabilitation Services Office [phone]    Angelina Ok [facility] 07/01/2023, 9:48 AM

## 2023-07-01 NOTE — Progress Notes (Signed)
   07/01/23 1428  Mobility  Activity Moved into chair position in bed  Level of Assistance Minimal assist, patient does 75% or more  Assistive Device Other (Comment) (Bedrails)  Activity Response Tolerated fair  Mobility Referral Yes  Mobility visit 1 Mobility  Mobility Specialist Start Time (ACUTE ONLY) 1409  Mobility Specialist Stop Time (ACUTE ONLY) 1428  Mobility Specialist Time Calculation (min) (ACUTE ONLY) 19 min   Mobility Specialist: Progress Note  Pt agreeable to mobility session - received in bed. C/o bottom soreness. Pt repositioned higher and upright in bed to eat lunch - further mobility denied by pt, does not like to sit in the chair as it hurts his bottom. Returned to chair position in bed with all needs met - call bell within reach. Chair alarm on. Daughter present.   Barnie Mort, BS Mobility Specialist Please contact via SecureChat or  Rehab office at [phone].

## 2023-07-01 NOTE — Progress Notes (Signed)
 Interventional Radiology Brief Note:  Patient s/p SPT placement yesterday.  Orders placed for patient to be scheduled for upsize/exchange in 4-6 weeks.   Loyce Dys, MS RD PA-C

## 2023-07-01 NOTE — Plan of Care (Signed)
  Problem: Clinical Measurements: Goal: Ability to maintain clinical measurements within normal limits will improve Outcome: Progressing Goal: Will remain free from infection Outcome: Progressing Goal: Respiratory complications will improve Outcome: Progressing   Problem: Activity: Goal: Risk for activity intolerance will decrease Outcome: Progressing   Problem: Pain Managment: Goal: General experience of comfort will improve and/or be controlled Outcome: Progressing

## 2023-07-01 NOTE — Progress Notes (Signed)
 Physical Therapy Treatment Patient Details Name: Matthew Mckay MRN: [id number] DOB: Dec 19, 1940 Today's Date: 07/01/2023   History of Present Illness Pt is an 83 y/o M presenting to ED on 3/10 after fall, down x4 hours admitted for sepsis 2/2 leukocytosis, tachycardia, UTI, also with rhabdomyolysis. Pt underwent placement of suprapubic catheter on 06/30/23.  PMH includes GERD, HTN, HLD, prostate CA, A fib, DDD, BPH    PT Comments  Noticed pt had slid down in recliner so far that only his head and shoulders were on the backrest and the rest of his body was on the chair seat and legrest with his legs hanging off end of legrest. When I asked pt why he didn't call for assistance he stated he was comfortable. Assisted pt to scoot up in recliner and then assisted up and around room with walker before returning to bed. Continue to feel he can not manage on his own at home and patient will benefit from continued inpatient follow up therapy, <3 hours/day.      If plan is discharge home, recommend the following: A little help with walking and/or transfers;A little help with bathing/dressing/bathroom;Help with stairs or ramp for entrance;Assistance with cooking/housework   Can travel by private vehicle     Yes  Equipment Recommendations  Rolling walker (2 wheels);Rollator (4 wheels) (RW vs rollator)    Recommendations for Other Services       Precautions / Restrictions Precautions Precautions: Fall;Other (comment) Recall of Precautions/Restrictions: Impaired Precaution/Restrictions Comments: Suprapubic catheter Restrictions Weight Bearing Restrictions Per Provider Order: No     Mobility  Bed Mobility Overal bed mobility: Needs Assistance Bed Mobility: Sit to Supine     Supine to sit: Min assist, HOB elevated Sit to supine: Contact guard assist   General bed mobility comments: Assist for safety    Transfers Overall transfer level: Needs assistance Equipment used: Rolling walker (2  wheels) Transfers: Sit to/from Stand Sit to Stand: Min assist           General transfer comment: Assist to power up and for balance. Verbal cues for hand placement    Ambulation/Gait Ambulation/Gait assistance: Min assist Gait Distance (Feet): 15 Feet Assistive device: Rolling walker (2 wheels) Gait Pattern/deviations: Step-through pattern, Decreased step length - right, Decreased step length - left, Shuffle, Wide base of support Gait velocity: decr Gait velocity interpretation: <1.8 ft/sec, indicate of risk for recurrent falls   General Gait Details: Assist for balance   Stairs             Wheelchair Mobility     Tilt Bed    Modified Rankin (Stroke Patients Only)       Balance Overall balance assessment: Needs assistance Sitting-balance support: Feet supported Sitting balance-Leahy Scale: Good     Standing balance support: During functional activity, Reliant on assistive device for balance Standing balance-Leahy Scale: Fair                              Hotel manager: No apparent difficulties  Cognition Arousal: Alert Behavior During Therapy: Anxious   PT - Cognitive impairments: Attention, Safety/Judgement, Memory                       PT - Cognition Comments: Pt very anxious and repetitive speech. Needs cues to stay on task Following commands: Intact      Cueing Cueing Techniques: Verbal cues, Gestural cues  Exercises  General Comments General comments (skin integrity, edema, etc.): VSS on RA      Pertinent Vitals/Pain Pain Assessment Pain Assessment: Faces Faces Pain Scale: Hurts little more Pain Location: buttocks Pain Descriptors / Indicators: Discomfort, Sore Pain Intervention(s): Repositioned    Home Living                          Prior Function            PT Goals (current goals can now be found in the care plan section) Progress towards PT goals: Progressing  toward goals    Frequency    Min 1X/week      PT Plan      Co-evaluation              AM-PAC PT "6 Clicks" Mobility   Outcome Measure  Help needed turning from your back to your side while in a flat bed without using bedrails?: A Little Help needed moving from lying on your back to sitting on the side of a flat bed without using bedrails?: A Little Help needed moving to and from a bed to a chair (including a wheelchair)?: A Little Help needed standing up from a chair using your arms (e.g., wheelchair or bedside chair)?: A Little Help needed to walk in hospital room?: A Little Help needed climbing 3-5 steps with a railing? : Total 6 Click Score: 16    End of Session Equipment Utilized During Treatment: Gait belt Activity Tolerance: Patient tolerated treatment well Patient left: in bed;with call bell/phone within reach;with bed alarm set Nurse Communication: Mobility status PT Visit Diagnosis: Unsteadiness on feet (R26.81);Other abnormalities of gait and mobility (R26.89);Muscle weakness (generalized) (M62.81);History of falling (Z91.81)     Time: 4132-4401 PT Time Calculation (min) (ACUTE ONLY): 16 min  Charges:    $Gait Training: 8-22 mins PT General Charges $$ ACUTE PT VISIT: 1 Visit                     [facility] PT Acute Rehabilitation Services Office [phone]    Angelina Ok [facility] 07/01/2023, 11:49 AM

## 2023-07-01 NOTE — Progress Notes (Signed)
 Subjective: No acute events overnight.  SPT in place draining clear yellow urine.  Reviewed case and plan with patient and nursing  Objective: Vital signs in last 24 hours: Temp:  [97.8 F (36.6 C)-98.9 F (37.2 C)] 97.8 F (36.6 C) (03/13 0808) Pulse Rate:  [62-90] 90 (03/13 0808) Resp:  [13-22] 17 (03/13 0808) BP: (135-172)/(70-110) 140/72 (03/13 0808) SpO2:  [94 %-100 %] 95 % (03/13 1610)  Assessment/Plan: # Urethral stricture disease #Bladder outlet obstruction #Urinary retention # Radiation cystitis  Failed bedside Foley catheter placement and cystoscopy due to stricture disease.  13f pigtail placed in IR on 06/30/2023.   Follow-up outpatient with IR in around 6 weeks for exchange and upsizing.   Schedule follow-up with Dr. Berneice Heinrich  Urology will follow peripherally.  Intake/Output from previous day: 03/12 0701 - 03/13 0700 In: 110 [I.V.:10; IV Piggyback:100] Out: 1300 [Urine:1300]  Intake/Output this shift: Total I/O In: 360 [P.O.:360] Out: 300 [Urine:300]  Physical Exam:  General: Alert and oriented CV: No cyanosis Lungs: equal chest rise Abdomen: Soft, tender at insertion site. Gu: 63f pigtail SPT in place draining clear yellow urine.  Lab Results: Recent Labs    06/29/23 0424 06/30/23 0502 07/01/23 0440  HGB 11.9* 12.2* 11.9*  HCT 35.6* 36.9* 36.0*   BMET Recent Labs    06/30/23 0502 07/01/23 0440  NA 138 142  K 4.0 3.8  CL 110 111  CO2 24 25  GLUCOSE 139* 118*  BUN 27* 26*  CREATININE 1.20 0.97  CALCIUM 8.1* 8.2*     Studies/Results: CT GUIDED SUPERPUBIC CATHETER PLMT Result Date: 06/30/2023 INDICATION: 83 year old male with history of urinary retention secondary to bladder outlet obstruction. EXAM: CT and ultrasound guided placement of suprapubic catheter COMPARISON:  06/27/2023 MEDICATIONS: The patient is currently admitted to the hospital and receiving intravenous antibiotics. The antibiotics were administered within an appropriate  time frame prior to the initiation of the procedure. ANESTHESIA/SEDATION: Moderate (conscious) sedation was employed during this procedure. A total of Versed 2 mg and Fentanyl 100 mcg was administered intravenously. Moderate Sedation Time: 10 minutes. The patient's level of consciousness and vital signs were monitored continuously by radiology nursing throughout the procedure under my direct supervision. CONTRAST:  None COMPLICATIONS: None immediate. PROCEDURE: RADIATION DOSE REDUCTION: This exam was performed according to the departmental dose-optimization program which includes automated exposure control, adjustment of the mA and/or kV according to patient size and/or use of iterative reconstruction technique.Informed written consent was obtained from the patient after a discussion of the risks, benefits and alternatives to treatment. The patient was placed supine on the CT gantry and a pre procedural CT was performed which demonstrated a partially distended bladder delete that. The procedure was planned. A timeout was performed prior to the initiation of the procedure. The suprapubic region was prepped and draped in the usual sterile fashion. The overlying soft tissues were anesthetized with 1% lidocaine with epinephrine. Appropriate trajectory was planned with the use of a 22 gauge spinal needle. An 18 gauge trocar needle was advanced into the urinary bladder and a short Amplatz super stiff wire was coiled within the bladder. The tract was serially dilated allowing placement of a 16 French all-purpose pigtail drainage catheter. Appropriate positioning was confirmed with a limited postprocedural CT scan. There was clear urine returned through the newly placed tube. The tube was connected to a drainage bag and sutured in place. A dressing was placed. The patient tolerated the procedure well without immediate post procedural complication. IMPRESSION:  Successful CT guided placement of a 16 [place] French pigtail  suprapubic catheter. Marliss Coots, MD [facility] Division Radiology Electronically Signed   By: Marliss Coots M.D.   On: 06/30/2023 16:43      LOS: 4 days   Elmon Kirschner, NP [facility] Pager: [phone]  07/01/2023, 12:12 PM

## 2023-07-02 DIAGNOSIS — R278 Other lack of coordination: Secondary | ICD-10-CM | POA: Diagnosis not present

## 2023-07-02 DIAGNOSIS — M6281 Muscle weakness (generalized): Secondary | ICD-10-CM | POA: Diagnosis not present

## 2023-07-02 DIAGNOSIS — R2681 Unsteadiness on feet: Secondary | ICD-10-CM | POA: Diagnosis not present

## 2023-07-02 DIAGNOSIS — N35119 Postinfective urethral stricture, not elsewhere classified, male, unspecified: Secondary | ICD-10-CM | POA: Diagnosis not present

## 2023-07-02 DIAGNOSIS — R531 Weakness: Secondary | ICD-10-CM | POA: Diagnosis not present

## 2023-07-02 DIAGNOSIS — Z9359 Other cystostomy status: Secondary | ICD-10-CM | POA: Diagnosis not present

## 2023-07-02 DIAGNOSIS — Z9181 History of falling: Secondary | ICD-10-CM | POA: Diagnosis not present

## 2023-07-02 DIAGNOSIS — I10 Essential (primary) hypertension: Secondary | ICD-10-CM | POA: Diagnosis not present

## 2023-07-02 DIAGNOSIS — M6282 Rhabdomyolysis: Secondary | ICD-10-CM | POA: Diagnosis not present

## 2023-07-02 DIAGNOSIS — K5900 Constipation, unspecified: Secondary | ICD-10-CM | POA: Diagnosis not present

## 2023-07-02 DIAGNOSIS — I4891 Unspecified atrial fibrillation: Secondary | ICD-10-CM | POA: Diagnosis not present

## 2023-07-02 DIAGNOSIS — Z7401 Bed confinement status: Secondary | ICD-10-CM | POA: Diagnosis not present

## 2023-07-02 DIAGNOSIS — N390 Urinary tract infection, site not specified: Secondary | ICD-10-CM | POA: Diagnosis not present

## 2023-07-02 DIAGNOSIS — R54 Age-related physical debility: Secondary | ICD-10-CM | POA: Diagnosis not present

## 2023-07-02 DIAGNOSIS — Z743 Need for continuous supervision: Secondary | ICD-10-CM | POA: Diagnosis not present

## 2023-07-02 DIAGNOSIS — K219 Gastro-esophageal reflux disease without esophagitis: Secondary | ICD-10-CM | POA: Diagnosis not present

## 2023-07-02 DIAGNOSIS — A419 Sepsis, unspecified organism: Secondary | ICD-10-CM | POA: Diagnosis not present

## 2023-07-02 DIAGNOSIS — N3040 Irradiation cystitis without hematuria: Secondary | ICD-10-CM | POA: Diagnosis not present

## 2023-07-02 DIAGNOSIS — N3091 Cystitis, unspecified with hematuria: Secondary | ICD-10-CM | POA: Diagnosis not present

## 2023-07-02 DIAGNOSIS — M6259 Muscle wasting and atrophy, not elsewhere classified, multiple sites: Secondary | ICD-10-CM | POA: Diagnosis not present

## 2023-07-02 LAB — CBC
HCT: 36.8 % — ABNORMAL LOW (ref 39.0–52.0)
Hemoglobin: 12.1 g/dL — ABNORMAL LOW (ref 13.0–17.0)
MCH: 29.4 pg (ref 26.0–34.0)
MCHC: 32.9 g/dL (ref 30.0–36.0)
MCV: 89.3 fL (ref 80.0–100.0)
Platelets: 231 10*3/uL (ref 150–400)
RBC: 4.12 MIL/uL — ABNORMAL LOW (ref 4.22–5.81)
RDW: 13.5 % (ref 11.5–15.5)
WBC: 16.3 10*3/uL — ABNORMAL HIGH (ref 4.0–10.5)
nRBC: 0 % (ref 0.0–0.2)

## 2023-07-02 LAB — CULTURE, BLOOD (ROUTINE X 2)
Culture: NO GROWTH
Culture: NO GROWTH

## 2023-07-02 LAB — BASIC METABOLIC PANEL
Anion gap: 10 (ref 5–15)
BUN: 19 mg/dL (ref 8–23)
CO2: 22 mmol/L (ref 22–32)
Calcium: 8 mg/dL — ABNORMAL LOW (ref 8.9–10.3)
Chloride: 106 mmol/L (ref 98–111)
Creatinine, Ser: 0.87 mg/dL (ref 0.61–1.24)
GFR, Estimated: >60 mL/min (ref >60)
Glucose, Bld: 139 mg/dL — ABNORMAL HIGH (ref 70–99)
Potassium: 3.7 mmol/L (ref 3.5–5.1)
Sodium: 138 mmol/L (ref 135–145)

## 2023-07-02 LAB — CK: Total CK: 310 U/L (ref 49–397)

## 2023-07-02 MED ORDER — DOCUSATE SODIUM 100 MG PO CAPS: 100.0000 mg | ORAL_CAPSULE | Freq: Two times a day (BID) | ORAL | 0 refills | Status: AC

## 2023-07-02 MED ORDER — BISACODYL 10 MG RE SUPP
10.0000 mg | Freq: Once | RECTAL | Status: AC
  Administered 2023-07-02: 10 mg via RECTAL
  Filled 2023-07-02: qty 1

## 2023-07-02 MED ORDER — ENSURE ENLIVE PO LIQD: 237.0000 mL | Freq: Two times a day (BID) | ORAL | 12 refills | Status: AC

## 2023-07-02 MED ORDER — POLYETHYLENE GLYCOL 3350 17 G PO PACK: 17.0000 g | PACK | Freq: Every day | ORAL | 0 refills | Status: AC

## 2023-07-02 MED ORDER — CEFDINIR 300 MG PO CAPS: 300.0000 mg | ORAL_CAPSULE | Freq: Two times a day (BID) | ORAL | 0 refills | Status: AC

## 2023-07-02 NOTE — Discharge Summary (Signed)
 Physician Discharge Summary  Matthew Mckay YIR:[id number] DOB: 1941-04-08 DOA: 06/27/2023  PCP: Jac Canavan, PA-C  Admit date: 06/27/2023  Discharge date: 07/02/2023  Admitted From:Home  Disposition:  SNF  Recommendations for Outpatient Follow-up:  Follow up with PCP in 1-2 weeks Remain on Omnicef for 3 more days to treat UTI for total 7 days Continue other medications as below Follow up with IR for suprapubic cath exchange 4-6 weeks Follow up with Urology Dr. Berneice Heinrich MRI follow up in 2 years for pancreatic lesion  Home Health:None  Equipment/Devices:Suprapubic catheter  Discharge Condition:Stable  CODE STATUS: Full  Diet recommendation: Heart Healthy  Brief/Interim Summary:  Matthew Mckay is a 83 y.o. male with past medical history significant of GERD, HTN, hyperlipidemia hx of prostate cancer, atrial fibrillation, DDD, BPH with hx of urinary retention  presented to ED after a fall. In ED, his workup significant for sepsis, secondary to bilateral pyelonephritis, as well he had evidence of urinary retention, patient was then admitted hospital for further evaluation and treatment. He has had suprapubic catheter placement per IR after Urology couldn't place catheter. He remains on antibiotics to finish his course of treatment and is stable for discharge to SNF today.  Discharge Diagnoses:  Principal Problem:   Sepsis (HCC) Active Problems:   Rhabdomyolysis   Elevated troponin   Atrial fibrillation (HCC)   Essential hypertension   GERD (gastroesophageal reflux disease)   Hyperlipidemia   History of prostate cancer   Tremor   Urinary retention   Urethral stricture  Sepsis secondary to UTI with cystitis and bilateral perinephritis Continue Rocephin, IV fluids.  Blood cultures negative in 4 days.  Initial Procalcitonin elevated at 8.29.  Urine culture negative.  Blood cultures negative.  Plan for 5-7-day course of antibiotics due to complicated UTI including strictures  need for suprapubic catheter.  Still with mild leukocytosis but no fever.  Trend for leukocytosis is increasing slightly.  Continue antibiotics as prescribed and follow up CBC outpatient.   Urinary retention with urinary strictures. History of prostate cancer Unable to put Foley catheter also IR was consulted for suprapubic catheter placement.  Status post suprapubic catheter placement 06/30/2023.  Has been having good urinary output. -Plan for exchange in 4-6 weeks per IR -Follow up with Urology Dr. Berneice Heinrich   Constipation -Continue Miralax and Colace   Rhabdomyolysis resolved Discontinue IV fluids.  CK now down to 310.  Continue to hold Lipitor until day of discharge.   Elevated troponin Troponin flat, no chest pain. Likely demand ischemia in setting of sepsis/afib.  No further workup planned.   Atrial fibrillation  CHADS2VASC score of 3.  Status post  DCCV on 09/02/22. Off Eliquis due to hematuria, multiple falls, recurrent hemorrhagic cystitis, risks outweighed benefits.  This was discussed with the family by previous provider.  Anticoagulation has been discontinued.   Essential hypertension Continue Toprol-XL.  Restart lisinopril since blood pressure is elevated today and and renal function has stabilized.   GERD (gastroesophageal reflux disease) Continue Pepcid.   Hyperlipidemia Hold Lipitor in the context of rhabdomyolysis.   May resume on discharge.   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 Berneice Heinrich, this post total resection, and radiation, 1999, status post TURP in 2005 as discussed with urology, developed radiation cystitis, patient recurrent hemorrhagic cystitis.  status post suprapubic catheter placement by IR.    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 Recommend follow-up MRI in 2 years per radiology.   Debility, deconditioning. Seen by physical  therapy and recommend skilled nursing facility placement.    Discharge Instructions  Discharge Instructions     Diet - low sodium heart healthy   Complete by: As directed    Increase activity slowly   Complete by: As directed    No wound care   Complete by: As directed       Allergies as of 07/02/2023       Reactions   Pollen Extract-tree Extract [pollen Extract]         Medication List     TAKE these medications    atorvastatin 10 MG tablet Commonly known as: LIPITOR Take 1 tablet (10 mg total) by mouth daily.   cefdinir 300 MG capsule Commonly known as: OMNICEF Take 1 capsule (300 mg total) by mouth 2 (two) times daily for 3 days.   docusate sodium 100 MG capsule Commonly known as: COLACE Take 1 capsule (100 mg total) by mouth 2 (two) times daily.   Eliquis 5 MG Tabs tablet Generic drug: apixaban TAKE 1 TABLET BY MOUTH TWICE A DAY   feeding supplement Liqd Take 237 mLs by mouth 2 (two) times daily between meals.   fluticasone 50 MCG/ACT nasal spray Commonly known as: FLONASE Place 2 sprays into both nostrils daily. SPRAY 2 SPRAYS INTO EACH NOSTRIL EVERY DAY What changed:  when to take this reasons to take this additional instructions   IBgard 90 MG Cpcr Generic drug: Peppermint Oil Take 1 tablet by mouth daily.   levocetirizine 5 MG tablet Commonly known as: XYZAL TAKE 1 TABLET BY MOUTH EVERY DAY IN THE EVENING   lisinopril 20 MG tablet Commonly known as: ZESTRIL Take 1 tablet (20 mg total) by mouth daily.   metoprolol succinate 25 MG 24 hr tablet Commonly known as: TOPROL-XL Take 1 tablet (25 mg total) by mouth daily. Appt req for refill [id number]   polyethylene glycol 17 g packet Commonly known as: MIRALAX / GLYCOLAX Take 17 g by mouth daily. Start taking on: July 03, 2023   Vitamin D3 75 MCG (3000 UT) Tabs Take 75 mcg by mouth daily.        Contact information for follow-up providers     Tysinger, Kermit Balo, PA-C. Schedule an  appointment as soon as possible for a visit in 1 week(s).   Specialty: Family Medicine Contact information: [address] [place] [address] [phone]              Contact information for after-discharge care     Destination     [facility] Preferred SNF .   Service: Skilled Nursing Contact information: [address] 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 EXAM: CT and ultrasound guided placement of suprapubic catheter COMPARISON:  06/27/2023 MEDICATIONS: The patient is currently admitted to the hospital and receiving intravenous antibiotics. The antibiotics were administered within an appropriate time frame prior to the initiation of the procedure. ANESTHESIA/SEDATION: Moderate (conscious) sedation was employed during this procedure. A total of Versed 2 mg and Fentanyl 100 mcg was administered intravenously. Moderate Sedation  Time: 10 minutes. The patient's level of consciousness and vital signs were monitored continuously by radiology nursing throughout the procedure under my direct supervision. CONTRAST:  None COMPLICATIONS: None immediate. PROCEDURE: RADIATION DOSE REDUCTION: This exam was performed according to the departmental dose-optimization program which includes automated exposure control, adjustment of the mA and/or kV according to patient size and/or use of iterative reconstruction technique.Informed written consent was obtained from the patient after a discussion of the risks, benefits and alternatives to treatment. The patient was placed supine on the CT gantry and a pre procedural CT was performed  which demonstrated a partially distended bladder delete that. The procedure was planned. A timeout was performed prior to the initiation of the procedure. The suprapubic region was prepped and draped in the usual sterile fashion. The overlying soft tissues were anesthetized with 1% lidocaine with epinephrine. Appropriate trajectory was planned with the use of a 22 gauge spinal needle. An 18 gauge trocar needle was advanced into the urinary bladder and a short Amplatz super stiff wire was coiled within the bladder. The tract was serially dilated allowing placement of a 16 French all-purpose pigtail drainage catheter. Appropriate positioning was confirmed with a limited postprocedural CT scan. There was clear urine returned through the newly placed tube. The tube was connected to a drainage bag and sutured in place. A dressing was placed. The patient tolerated the procedure well without immediate post procedural complication. IMPRESSION: Successful CT guided placement of a 48 [place] French pigtail suprapubic catheter. Marliss Coots, MD [facility] Electronically Signed   By: Marliss Coots M.D.   On: 06/30/2023 16:43   CT ABDOMEN PELVIS W CONTRAST Result Date: 06/27/2023 CLINICAL DATA:  Acute abdominal pain EXAM: CT ABDOMEN AND PELVIS WITH CONTRAST TECHNIQUE: Multidetector CT imaging of the abdomen and pelvis was performed using the standard protocol following bolus administration of intravenous contrast. RADIATION DOSE REDUCTION: This exam was performed according to the departmental dose-optimization program which includes automated exposure control, adjustment of the mA and/or kV according to patient size and/or use of iterative reconstruction technique. CONTRAST:  75mL OMNIPAQUE IOHEXOL 350 MG/ML SOLN COMPARISON:  CT abdomen and pelvis 03/16/2023. FINDINGS: Lower chest: No acute abnormality. Hepatobiliary: No focal liver abnormality is seen. Status post  cholecystectomy. No biliary dilatation. Pancreas: There are scattered hypodensities throughout the pancreas measuring 1 cm or less. There is mild diffuse pancreatic atrophy. There is no acute inflammation or ductal dilatation. Findings are similar to prior. Spleen: Normal in size without focal abnormality. Adrenals/Urinary Tract: There are ill-defined patchy areas of cortical hypodensity throughout both kidneys with bilateral perinephric fat stranding, right greater than left. There is no hydronephrosis. Bilateral renal cysts are similar to the prior examination measuring up to 5.5 cm. The adrenal glands and bladder are within normal limits. Stomach/Bowel: Stomach is within normal limits. Appendix appears normal. No evidence of bowel wall thickening, distention, or inflammatory changes. There is sigmoid colon diverticulosis. Vascular/Lymphatic: Aortic atherosclerosis. No enlarged abdominal or pelvic lymph nodes. Reproductive: TURP defect in the prostate gland. Other: Mild presacral edema. Small fat containing umbilical hernias. Musculoskeletal: T12-L1 chronic compression deformities are unchanged. IMPRESSION: 1. Findings compatible with bilateral pyelonephritis. No hydronephrosis. 2. Stable bilateral renal cysts. 3. 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 Recommend follow-up MRI in 2 years. 4. Sigmoid colon diverticulosis. 5. Aortic atherosclerosis. Aortic Atherosclerosis (ICD10-I70.0). Electronically Signed   By: Darliss Cheney M.D.   On: 06/27/2023 20:31   CT HEAD WO CONTRAST Result  Date: 06/27/2023 CLINICAL DATA:  Head trauma, fall, on blood thinners EXAM: CT HEAD WITHOUT CONTRAST CT CERVICAL SPINE WITHOUT CONTRAST TECHNIQUE: Multidetector CT imaging of the head and cervical spine was performed following the standard protocol without intravenous contrast. Multiplanar CT image reconstructions of the cervical spine were also generated. RADIATION DOSE REDUCTION: This exam was performed according to  the departmental dose-optimization program which includes automated exposure control, adjustment of the mA and/or kV according to patient size and/or use of iterative reconstruction technique. COMPARISON:  CT cervical spine, 10/14/2019 FINDINGS: CT HEAD FINDINGS Brain: No evidence of acute infarction, acute hemorrhage, hydrocephalus, or mass lesion/mass effect. Chronic, fluid attenuation left frontal hygroma measuring 0.7 cm in thickness (series 2, image 19, series 4, image 51). Encephalomalacia of the right cerebellar hemisphere (series 2, image 25). Periventricular white matter hypodensity. Vascular: No hyperdense vessel or unexpected calcification. Skull: Normal. Negative for fracture or focal lesion. Sinuses/Orbits: No acute finding. Other: Soft tissue contusion of the left forehead (series 2, image 15). CT CERVICAL SPINE FINDINGS Alignment: Degenerative straightening of the normal cervical lordosis. Skull base and vertebrae: No acute fracture. No primary bone lesion or focal pathologic process. Soft tissues and spinal canal: No prevertebral fluid or swelling. No visible canal hematoma. Disc levels: Mild moderate multilevel cervical disc degenerative disease, worst from C5-C7. Upper chest: Negative. Other: None. IMPRESSION: 1. No acute intracranial pathology. 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 2. Chronic, fluid attenuation left frontal hygroma measuring 0.7 cm in thickness. No acute hemorrhage. 3. Soft tissue contusion of the left forehead. 4. No fracture or static subluxation of the cervical spine. 5. Mild-to-moderate multilevel cervical disc degenerative disease, worst from C5-C7. Electronically Signed   By: Jearld Lesch M.D.   On: 06/27/2023 13:22   CT CERVICAL SPINE WO CONTRAST Result Date: 06/27/2023 CLINICAL DATA:  Head trauma, fall, on blood thinners EXAM: CT HEAD WITHOUT CONTRAST CT CERVICAL SPINE WITHOUT CONTRAST TECHNIQUE: Multidetector CT imaging of the head and  cervical spine was performed following the standard protocol without intravenous contrast. Multiplanar CT image reconstructions of the cervical spine were also generated. RADIATION DOSE REDUCTION: This exam was performed according to the departmental dose-optimization program which includes automated exposure control, adjustment of the mA and/or kV according to patient size and/or use of iterative reconstruction technique. COMPARISON:  CT cervical spine, 10/14/2019 FINDINGS: CT HEAD FINDINGS Brain: No evidence of acute infarction, acute hemorrhage, hydrocephalus, or mass lesion/mass effect. Chronic, fluid attenuation left frontal hygroma measuring 0.7 cm in thickness (series 2, image 19, series 4, image 51). Encephalomalacia of the right cerebellar hemisphere (series 2, image 25). Periventricular white matter hypodensity. Vascular: No hyperdense vessel or unexpected calcification. Skull: Normal. Negative for fracture or focal lesion. Sinuses/Orbits: No acute finding. Other: Soft tissue contusion of the left forehead (series 2, image 15). CT CERVICAL SPINE FINDINGS Alignment: Degenerative straightening of the normal cervical lordosis. Skull base and vertebrae: No acute fracture. No primary bone lesion or focal pathologic process. Soft tissues and spinal canal: No prevertebral fluid or swelling. No visible canal hematoma. Disc levels: Mild moderate multilevel cervical disc degenerative disease, worst from C5-C7. Upper chest: Negative. Other: None. IMPRESSION: 1. No acute intracranial pathology. 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 2. Chronic, fluid attenuation left frontal hygroma measuring 0.7 cm in thickness. No acute hemorrhage. 3. Soft tissue contusion of the left forehead. 4. No fracture or static subluxation of the cervical spine. 5. Mild-to-moderate multilevel cervical disc degenerative disease, worst from C5-C7.  Electronically Signed   By: Jearld Lesch M.D.   On: 06/27/2023  13:22   DG Chest Portable 1 View Result Date: 06/27/2023 CLINICAL DATA:  Fall.  Cough EXAM: PORTABLE CHEST 1 VIEW COMPARISON:  X-ray 07/23/2022 and older FINDINGS: Underinflation. Enlarged cardiopericardial silhouette. Calcified aorta. No pneumothorax, effusion or edema. Overlapping cardiac leads. Degenerative changes along the spine. IMPRESSION: Underinflation.  No acute cardiopulmonary disease. Electronically Signed   By: Karen Kays M.D.   On: 06/27/2023 12:34     Discharge Exam: Vitals:   07/02/23 0800 07/02/23 1229  BP: (!) 176/116 137/87  Pulse: 89 94  Resp: 18 (!) 21  Temp: 97.9 F (36.6 C) 98.1 F (36.7 C)  SpO2: 93% 95%   Vitals:   07/02/23 0546 07/02/23 0730 07/02/23 0800 07/02/23 1229  BP:  (!) 169/82 (!) 176/116 137/87  Pulse:  81 89 94  Resp:  18 18 (!) 21  Temp:   97.9 F (36.6 C) 98.1 F (36.7 C)  TempSrc:   Oral Oral  SpO2:  92% 93% 95%  Weight: 80.4 kg     Height:        General: Pt is alert, awake, not in acute distress Cardiovascular: RRR, S1/S2 +, no rubs, no gallops Respiratory: CTA bilaterally, no wheezing, no rhonchi Abdominal: Soft, NT, ND, bowel sounds + Extremities: no edema, no cyanosis    The results of significant diagnostics from this hospitalization (including imaging, microbiology, ancillary and laboratory) are listed below for reference.     Microbiology: Recent Results (from the past 240 hours)  Resp panel by RT-PCR (RSV, Flu A&B, Covid) Anterior Nasal Swab     Status: None   Collection Time: 06/27/23 12:21 PM   Specimen: Anterior Nasal Swab  Result Value Ref Range Status   SARS Coronavirus 2 by RT PCR NEGATIVE NEGATIVE Final   Influenza A by PCR NEGATIVE NEGATIVE Final   Influenza B by PCR NEGATIVE NEGATIVE Final    Comment: (NOTE) The Xpert Xpress SARS-CoV-2/FLU/RSV plus assay is intended as an aid in the diagnosis of influenza from Nasopharyngeal swab specimens and should not be used as a sole basis for treatment. Nasal  washings and aspirates are unacceptable for Xpert Xpress SARS-CoV-2/FLU/RSV testing.  Fact Sheet for Patients: BloggerCourse.com  Fact Sheet for Healthcare Providers: SeriousBroker.it  This test is not yet approved or cleared by the [place] FDA and has been authorized for detection and/or diagnosis of SARS-CoV-2 by FDA under an Emergency Use Authorization (EUA). This EUA will remain in effect (meaning this test can be used) for the duration of the COVID-19 declaration under Section 564(b)(1) of the Act, 21 U.S.C. section 360bbb-3(b)(1), unless the authorization is terminated or revoked.     Resp Syncytial Virus by PCR NEGATIVE NEGATIVE Final    Comment: (NOTE) Fact Sheet for Patients: BloggerCourse.com  Fact Sheet for Healthcare Providers: SeriousBroker.it  This test is not yet approved or cleared by the [place] FDA and has been authorized for detection and/or diagnosis of SARS-CoV-2 by FDA under an Emergency Use Authorization (EUA). This EUA will remain in effect (meaning this test can be used) for the duration of the COVID-19 declaration under Section 564(b)(1) of the Act, 21 U.S.C. section 360bbb-3(b)(1), unless the authorization is terminated or revoked.  Performed at [facility], [address] [place], [place] [address]   Blood Culture (routine x 2)     Status: None   Collection Time: 06/27/23  2:32 PM   Specimen: BLOOD  Result Value Ref Range Status   Specimen Description BLOOD SITE NOT SPECIFIED  Final   Special Requests   Final    BOTTLES DRAWN AEROBIC ONLY Blood Culture results may not be optimal due to an inadequate volume of blood received in culture bottles   Culture   Final    NO GROWTH 5 DAYS Performed at [facility], [address] [place], [place] [address]    Report Status 07/02/2023 FINAL  Final  Blood Culture (routine x 2)      Status: None   Collection Time: 06/27/23  2:37 PM   Specimen: BLOOD  Result Value Ref Range Status   Specimen Description BLOOD SITE NOT SPECIFIED  Final   Special Requests   Final    BOTTLES DRAWN AEROBIC ONLY Blood Culture results may not be optimal due to an inadequate volume of blood received in culture bottles   Culture   Final    NO GROWTH 5 DAYS Performed at [facility], [address], [place], [place] [address]    Report Status 07/02/2023 FINAL  Final  Urine culture     Status: Abnormal   Collection Time: 06/27/23  3:12 PM   Specimen: Urine, Clean Catch  Result Value Ref Range Status   Specimen Description URINE, CLEAN CATCH  Final   Special Requests   Final    NONE Performed at [facility], [address], [place], [place] [address]    Culture MULTIPLE SPECIES PRESENT, SUGGEST RECOLLECTION (A)  Final   Report Status 06/28/2023 FINAL  Final  Urine Culture (for pregnant, neutropenic or urologic patients or patients with an indwelling urinary catheter)     Status: None   Collection Time: 06/28/23  2:41 PM   Specimen: Urine, Clean Catch  Result Value Ref Range Status   Specimen Description URINE, CLEAN CATCH  Final   Special Requests NONE  Final   Culture   Final    NO GROWTH Performed at [facility], [address], [place], [place] [address]    Report Status 06/30/2023 FINAL  Final     Labs: BNP (last 3 results) Recent Labs    06/28/23 0442 06/29/23 0424  BNP 1,051.8* 718.3*   Basic Metabolic Panel: Recent Labs  Lab 06/27/23 1217 06/27/23 1226 06/27/23 1515 06/28/23 0444 06/29/23 0424 06/30/23 0502 07/01/23 0440 07/02/23 0600  NA 140   < >  --  140 141 138 142 138  K 3.5   < >  --  3.4* 3.8 4.0 3.8 3.7  CL 108   < >  --  112* 111 110 111 106  CO2 22  --   --  20* 23 24 25 22   GLUCOSE 186*   < >  --  147* 135* 139* 118* 139*  BUN 31*   < >  --  31* 28* 27* 26* 19  CREATININE 1.23   < >  --  1.14 0.88 1.20 0.97 0.87  CALCIUM  9.4  --   --  8.3* 8.2* 8.1* 8.2* 8.0*  MG 1.8  --   --  1.7 2.1 1.9 1.9  --   PHOS  --   --  2.4* 2.2* 2.7 2.2* 2.5  --    < > = values in this interval not displayed.   Liver Function Tests: Recent Labs  Lab 06/27/23 1217 06/28/23 0444  AST 32 69*  ALT 14 26  ALKPHOS 34* 34*  BILITOT 1.0  0.7  PROT 6.5 5.8*  ALBUMIN 2.7* 2.3*   No results for input(s): "LIPASE", "AMYLASE" in the last 168 hours. No results for input(s): "AMMONIA" in the last 168 hours. CBC: Recent Labs  Lab 06/28/23 0444 06/29/23 0424 06/30/23 0502 07/01/23 0440 07/02/23 0600  WBC 21.4* 16.5* 14.3* 13.4* 16.3*  HGB 12.5* 11.9* 12.2* 11.9* 12.1*  HCT 38.4* 35.6* 36.9* 36.0* 36.8*  MCV 91.0 87.7 89.1 89.3 89.3  PLT 161 164 148* 182 231   Cardiac Enzymes: Recent Labs  Lab 06/27/23 1217 06/28/23 0444 06/29/23 0424 07/02/23 0600  CKTOTAL 1,051* 2,152* 1,908* 310   BNP: Invalid input(s): "POCBNP" CBG: Recent Labs  Lab 06/28/23 0756 07/01/23 1939  GLUCAP 155* 167*   D-Dimer No results for input(s): "DDIMER" in the last 72 hours. Hgb A1c No results for input(s): "HGBA1C" in the last 72 hours. Lipid Profile No results for input(s): "CHOL", "HDL", "LDLCALC", "TRIG", "CHOLHDL", "LDLDIRECT" in the last 72 hours. Thyroid function studies No results for input(s): "TSH", "T4TOTAL", "T3FREE", "THYROIDAB" in the last 72 hours.  Invalid input(s): "FREET3" Anemia work up No results for input(s): "VITAMINB12", "FOLATE", "FERRITIN", "TIBC", "IRON", "RETICCTPCT" in the last 72 hours. Urinalysis    Component Value Date/Time   COLORURINE YELLOW 06/27/2023 1519   APPEARANCEUR HAZY (A) 06/27/2023 1519   LABSPEC 1.017 06/27/2023 1519   LABSPEC 1.020 11/30/2022 1239   PHURINE 5.0 06/27/2023 1519   GLUCOSEU NEGATIVE 06/27/2023 1519   HGBUR LARGE (A) 06/27/2023 1519   BILIRUBINUR NEGATIVE 06/27/2023 1519   BILIRUBINUR negative 11/30/2022 1239   BILIRUBINUR n 02/12/2015 0952   KETONESUR NEGATIVE  06/27/2023 1519   PROTEINUR 100 (A) 06/27/2023 1519   UROBILINOGEN negative 02/12/2015 0952   UROBILINOGEN 0.2 12/15/2008 0914   NITRITE NEGATIVE 06/27/2023 1519   LEUKOCYTESUR LARGE (A) 06/27/2023 1519   Sepsis Labs Recent Labs  Lab 06/29/23 0424 06/30/23 0502 07/01/23 0440 07/02/23 0600  WBC 16.5* 14.3* 13.4* 16.3*   Microbiology Recent Results (from the past 240 hours)  Resp panel by RT-PCR (RSV, Flu A&B, Covid) Anterior Nasal Swab     Status: None   Collection Time: 06/27/23 12:21 PM   Specimen: Anterior Nasal Swab  Result Value Ref Range Status   SARS Coronavirus 2 by RT PCR NEGATIVE NEGATIVE Final   Influenza A by PCR NEGATIVE NEGATIVE Final   Influenza B by PCR NEGATIVE NEGATIVE Final    Comment: (NOTE) The Xpert Xpress SARS-CoV-2/FLU/RSV plus assay is intended as an aid in the diagnosis of influenza from Nasopharyngeal swab specimens and should not be used as a sole basis for treatment. Nasal washings and aspirates are unacceptable for Xpert Xpress SARS-CoV-2/FLU/RSV testing.  Fact Sheet for Patients: BloggerCourse.com  Fact Sheet for Healthcare Providers: SeriousBroker.it  This test is not yet approved or cleared by the [place] FDA and has been authorized for detection and/or diagnosis of SARS-CoV-2 by FDA under an Emergency Use Authorization (EUA). This EUA will remain in effect (meaning this test can be used) for the duration of the COVID-19 declaration under Section 564(b)(1) of the Act, 21 U.S.C. section 360bbb-3(b)(1), unless the authorization is terminated or revoked.     Resp Syncytial Virus by PCR NEGATIVE NEGATIVE Final    Comment: (NOTE) Fact Sheet for Patients: BloggerCourse.com  Fact Sheet for Healthcare Providers: SeriousBroker.it  This test is not yet approved or cleared by the [place] FDA and has been authorized for detection  and/or diagnosis of SARS-CoV-2 by FDA under an Emergency Use Authorization (EUA). This  EUA will remain in effect (meaning this test can be used) for the duration of the COVID-19 declaration under Section 564(b)(1) of the Act, 21 U.S.C. section 360bbb-3(b)(1), unless the authorization is terminated or revoked.  Performed at [facility], [address] [place], [place] [address]   Blood Culture (routine x 2)     Status: None   Collection Time: 06/27/23  2:32 PM   Specimen: BLOOD  Result Value Ref Range Status   Specimen Description BLOOD SITE NOT SPECIFIED  Final   Special Requests   Final    BOTTLES DRAWN AEROBIC ONLY Blood Culture results may not be optimal due to an inadequate volume of blood received in culture bottles   Culture   Final    NO GROWTH 5 DAYS Performed at [facility], [address] [place], [place] [address]    Report Status 07/02/2023 FINAL  Final  Blood Culture (routine x 2)     Status: None   Collection Time: 06/27/23  2:37 PM   Specimen: BLOOD  Result Value Ref Range Status   Specimen Description BLOOD SITE NOT SPECIFIED  Final   Special Requests   Final    BOTTLES DRAWN AEROBIC ONLY Blood Culture results may not be optimal due to an inadequate volume of blood received in culture bottles   Culture   Final    NO GROWTH 5 DAYS Performed at [facility], [address] [place], [place] [address]    Report Status 07/02/2023 FINAL  Final  Urine culture     Status: Abnormal   Collection Time: 06/27/23  3:12 PM   Specimen: Urine, Clean Catch  Result Value Ref Range Status   Specimen Description URINE, CLEAN CATCH  Final   Special Requests   Final    NONE Performed at [facility], [address], [place], [place] [address]    Culture MULTIPLE SPECIES PRESENT, SUGGEST RECOLLECTION (A)  Final   Report Status 06/28/2023 FINAL  Final  Urine Culture (for pregnant, neutropenic or urologic patients or patients with an indwelling urinary  catheter)     Status: None   Collection Time: 06/28/23  2:41 PM   Specimen: Urine, Clean Catch  Result Value Ref Range Status   Specimen Description URINE, CLEAN CATCH  Final   Special Requests NONE  Final   Culture   Final    NO GROWTH Performed at [facility], [address], [place], [place] [address]    Report Status 06/30/2023 FINAL  Final     Time coordinating discharge: 35 minutes  SIGNED:   Erick Blinks, DO [facility] 07/02/2023, 2:46 PM  If 7PM-7AM, please contact night-coverage [email or web address]

## 2023-07-02 NOTE — Progress Notes (Addendum)
 Occupational Therapy Treatment Patient Details Name: Matthew Mckay MRN: [id number] DOB: September 21, 1940 Today's Date: 07/02/2023   History of present illness Pt is an 83 y/o M presenting to ED on 3/10 after fall, down x4 hours admitted for sepsis 2/2 leukocytosis, tachycardia, UTI, also with rhabdomyolysis. Pt underwent placement of suprapubic catheter on 06/30/23.  PMH includes GERD, HTN, HLD, prostate CA, A fib, DDD, BPH   OT comments  Patient continues to make good progress with OT treatment. Patient seen from EOB and did not transfer to recliner due to nursing stating patient was unsafe in chair yesterday. Patient able to get to EOB with min assist and was able to perform self care tasks seated on EOB with increased time due to slow processing and patient requiring frequent cues to stay on tasks. Patient performed standing from EOB and side stepping towards HOB before returning to supine. Patient will benefit from continued inpatient follow up therapy, <3 hours/day. Acute OT to continue to follow to address established goals to facilitate DC to next venue of care.       If plan is discharge home, recommend the following:  A lot of help with bathing/dressing/bathroom;Assistance with cooking/housework;Assistance with feeding;Assist for transportation;Direct supervision/assist for financial management;Direct supervision/assist for medications management;A little help with walking and/or transfers   Equipment Recommendations  Tub/shower seat;Other (comment);BSC/3in1 (RW)    Recommendations for Other Services      Precautions / Restrictions Precautions Precautions: Fall Recall of Precautions/Restrictions: Impaired Precaution/Restrictions Comments: Suprapubic catheter Restrictions Weight Bearing Restrictions Per Provider Order: No       Mobility Bed Mobility Overal bed mobility: Needs Assistance Bed Mobility: Supine to Sit, Sit to Supine     Supine to sit: Min assist, HOB elevated Sit  to supine: Contact guard assist   General bed mobility comments: Pt. able to complete sit to stand with min assist and side step towards HOB    Transfers Overall transfer level: Needs assistance Equipment used: Rolling walker (2 wheels) Transfers: Sit to/from Stand Sit to Stand: Min assist, Contact guard assist, From elevated surface           General transfer comment: Perfomed side stepping towards HOB with CGA     Balance Overall balance assessment: Needs assistance Sitting-balance support: Feet supported Sitting balance-Leahy Scale: Good     Standing balance support: During functional activity, Reliant on assistive device for balance                               ADL either performed or assessed with clinical judgement   ADL Overall ADL's : Needs assistance/impaired     Grooming: Supervision/safety;Wash/dry face;Oral care;Cueing for sequencing Grooming Details (indicate cue type and reason): pt. required increased time to perform and complete task                                    Extremity/Trunk Assessment              Vision       Perception     Praxis     Communication Communication Communication: No apparent difficulties   Cognition Arousal: Alert Behavior During Therapy: WFL for tasks assessed/performed Cognition: Cognition impaired     Awareness: Online awareness impaired Memory impairment (select all impairments): Short-term memory   Executive functioning impairment (select all impairments): Initiation, Sequencing OT - Cognition Comments: Pt.  required cues for inititating task and staying on task, required increased time to complete task                 Following commands: Intact        Cueing   Cueing Techniques: Verbal cues, Gestural cues  Exercises      Shoulder Instructions       General Comments      Pertinent Vitals/ Pain       Pain Assessment Pain Assessment: Faces Faces Pain Scale:  No hurt  Home Living                                          Prior Functioning/Environment              Frequency  Min 1X/week        Progress Toward Goals  OT Goals(current goals can now be found in the care plan section)  Progress towards OT goals: Progressing toward goals  Acute Rehab OT Goals Patient Stated Goal: get better OT Goal Formulation: With patient Time For Goal Achievement: 07/12/23 Potential to Achieve Goals: Good ADL Goals Pt Will Perform Upper Body Dressing: with min assist;sitting Pt Will Perform Lower Body Dressing: with min assist;sit to/from stand;sitting/lateral leans Pt Will Transfer to Toilet: with min assist;ambulating;regular height toilet Additional ADL Goal #1: pt will perform bed mobility with supervision in prep for OOB activity.  Plan      Co-evaluation                 AM-PAC OT "6 Clicks" Daily Activity     Outcome Measure   Help from another person eating meals?: A Little Help from another person taking care of personal grooming?: A Little Help from another person toileting, which includes using toliet, bedpan, or urinal?: A Lot Help from another person bathing (including washing, rinsing, drying)?: A Lot Help from another person to put on and taking off regular upper body clothing?: A Lot Help from another person to put on and taking off regular lower body clothing?: A Lot 6 Click Score: 14    End of Session Equipment Utilized During Treatment: Gait belt;Rolling walker (2 wheels);Oxygen  OT Visit Diagnosis: Unsteadiness on feet (R26.81);Other abnormalities of gait and mobility (R26.89);Muscle weakness (generalized) (M62.81);History of falling (Z91.81);Other symptoms and signs involving the nervous system (R29.898)   Activity Tolerance Patient tolerated treatment well   Patient Left in bed;with call bell/phone within reach;with bed alarm set   Nurse Communication Mobility status        Time:  4098-1191 OT Time Calculation (min): 36 min  Charges: OT General Charges $OT Visit: 1 Visit OT Treatments $Self Care/Home Management : 8-22 mins $Therapeutic Activity: 8-22 mins  Alfonse Flavors, OTA Acute Rehabilitation Services  Office [phone]   Dewain Penning 07/02/2023, 12:14 PM

## 2023-07-02 NOTE — TOC Transition Note (Signed)
 Transition of Care [facility]) - Discharge Note   Patient Details  Name: Matthew Mckay MRN: [id number] Date of Birth: [birth year]  [facility] CM/SW Contact:  Mearl Latin, LCSW Phone Number: 07/02/2023, 3:09 PM   Clinical Narrative:    Patient will DC to: [facility] Anticipated DC date: 07/02/23 Family notified: Daughter, Annice Pih Transport by: Sharin Mons   Per MD patient ready for DC to [facility]. RN to call report prior to discharge [phone] room 1003B). RN, patient, patient's family, and facility notified of DC. Discharge Summary and FL2 sent to facility. DC packet on chart. Ambulance transport requested for patient.   CSW will sign off for now as social work intervention is no longer needed. Please consult [place] again if new needs arise.     Final next level of care: Skilled Nursing Facility Barriers to Discharge: Barriers Resolved   Patient Goals and CMS Choice Patient states their goals for this hospitalization and ongoing recovery are:: Rehab CMS Medicare.gov Compare Post Acute Care list provided to:: Patient Choice offered to / list presented to : Patient [place] ownership interest in [facility].provided to:: Patient    Discharge Placement   Existing PASRR number confirmed : 07/02/23          Patient [facility] bed [facility]: [facility] Patient to be transferred to facility by: [facility] Name of family member notified: Annice Pih Patient and family notified of of transfer: 07/02/23  Discharge Plan and Services Additional resources added to the After Visit Summary for   In-house Referral: Clinical Social Work   Post Acute Care Choice: Skilled Nursing Facility                               Social Drivers of Health (SDOH) Interventions SDOH Screenings   Food Insecurity: No Food Insecurity (06/27/2023)  Housing: Unknown (06/27/2023)  Transportation Needs: No Transportation Needs (06/27/2023)  Utilities: Not At Risk (06/27/2023)  Alcohol Screen:  Low Risk  (12/22/2022)  Depression (PHQ2-9): Low Risk  (12/22/2022)  Financial Resource Strain: Low Risk  (12/22/2022)  Physical Activity: Inactive (12/22/2022)  Social Connections: Moderately Isolated (06/27/2023)  Stress: No Stress Concern Present (12/22/2022)  Tobacco Use: Low Risk  (06/27/2023)  Health Literacy: Adequate Health Literacy (12/22/2022)     Readmission Risk Interventions     No data to display

## 2023-07-02 NOTE — TOC Initial Note (Signed)
 Transition of Care [facility]) - Initial/Assessment Note    Patient Details  Name: Matthew Mckay MRN: [id number] Date of Birth: [birth year]  [facility]) CM/SW Contact:    Mearl Latin, LCSW Phone Number: 07/02/2023, 9:35 AM  Clinical Narrative:                 CSW received consult for possible SNF placement at time of discharge. CSW spoke with patient. Patient reported that he lives alone and his daughter lives next door but is currently unable to care for patient given patient's current physical needs and fall risk. Patient expressed understanding of PT recommendation and is agreeable to SNF placement at time of discharge. Patient requests CSW contact his daughter for assistance with the decision. CSW discussed insurance authorization process and will provide Medicare SNF ratings list. CSW will send out referrals for review and provide bed offers as available.    Skilled Nursing Rehab Facilities-   [email or web address]   Ratings out of 5 stars (5 the highest)   Name Address  Phone # Quality Care Staffing Health Inspection Overall  [facility] [address], [place] [phone] 3 3 4 4   [facility] [address] [place] [phone] 5 1 4 4   [facility] [address], [place] [phone] 2 2 2 2   [facility] [address] [place] [phone] 5 2 4 5   [facility]  [address], [place] [phone] 4 3 5 5   [facility] [address], [facility] [phone] 4 2 2 2   [facility] [address], [place] [phone] 5 1 2 2   [facility] [phone] [address] [place] [phone] 2 1 3 2   [address] ([place]) [address] [place] [phone] 2 3 3 3   [facility] [address] [place], [place] [phone] 3 3 2 2   [facility] ([place]) [address], [place] [phone] 3 1 1 1   [facility]igh[facility] [address] [place] [phone] 2 3 4 4   [facility] [address], [place] [phone] 4 4 3 3   [address] ([place]) [address], [place] [phone] 1 2 4 3           [facility] [address] [place] [phone] 2 1 4 3   [facility] [address], [place] [phone] 4 2 1 1   [facility]  [address] [place], [place] [address] [phone] 2 2 2 2   [facility] [place] [address] [phone] 3 2 4 4   [facility] [address], [place] [phone] 2 1 2 1   [facility] (No U[facility]) [address], [place] [phone] 5 4 5 5   [facility] [address], [facility] [phone] 3 4 2 2   [facility] [address], [place] [phone] 2 1 1 1   [place] [address] [place] [phone] 4 2 5 5   [facility] [address], [place] [phone] 4 1 1 1   [facility] [address] [place], [place] [phone] 2 2 3 3           [facility]  [phone] 3 1 1 1   [facility] [address] [place]  [phone] 3 3 3 3   [facility] (No Hum[employer]) [address] [place] [phone] 2 2 4 4   [facility]) [address], [place] [phone] 2 1 1 1   [facility] [place] [address], [place] [phone] 3  5 5  [facility] [place] [address], [place] [phone] 1 1 1 1           [facility] [address] [place], [place] [phone] 5 4 5 5   [facility])  [address], [place] [phone] 1 1 2 1   [facility]) [address], [place] [phone]  2 4 4   [facility] [address], [place] [phone] 3 5 5 5   [address] [place], [place] [phone] 4 2 2 2   [facility]) [address] [place] [phone] 2 1 3 2      Expected Discharge Plan: Skilled Nursing Facility Barriers to Discharge: Continued  Medical Work up, English as a second language teacher, SNF Pending bed offer   Patient Goals and CMS Choice Patient states their goals for this hospitalization and ongoing recovery are:: Rehab CMS Medicare.gov Compare Post Acute Care list provided to:: Patient Choice offered to / list presented to : Patient [place] ownership interest in [facility].provided to:: Patient    Expected Discharge Plan and Services In-house Referral: Clinical Social Work   Post Acute Care Choice: Skilled Nursing Facility Living arrangements for the past 2 months: Single Family Home                                      Prior Living Arrangements/Services Living arrangements for the past 2 months: Single Family Home Lives with:: Self Patient language and need for interpreter reviewed:: Yes Do you feel safe going back to the place where you live?: Yes      Need for Family Participation in Patient Care: Yes (Comment) Care giver support system in place?: Yes (comment)   Criminal Activity/Legal Involvement Pertinent to Current Situation/Hospitalization: No - Comment as needed  Activities of Daily Living   ADL Screening (condition at time of admission) Independently performs ADLs?: Yes (appropriate for developmental age) Is the patient deaf or have difficulty hearing?: No Does the patient have difficulty seeing, even when wearing glasses/contacts?: No Does the patient have difficulty concentrating, remembering, or making decisions?: No  Permission Sought/Granted Permission sought to share information with : Facility Medical sales representative, Family Supports Permission granted to share information with : Yes, Verbal Permission Granted  Share Information with NAME: Annice Pih  Permission granted to share info w AGENCY: SNFs  Permission granted to share info w Relationship: Daughter  Permission granted to share info w Contact Information: [phone]  Emotional Assessment Appearance:: Appears stated  age            Admission diagnosis:  Fall in home, initial encounter [W19.XXXA, Y92.009] Sepsis (HCC) [A41.9] Sepsis without acute organ dysfunction, due to unspecified organism [facility]) [A41.9] Patient Active Problem List   Diagnosis Date Noted   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   Rhabdomyolysis 06/27/2023   Sepsis (HCC) 06/27/2023   Elevated troponin 06/27/2023   Abdominal discomfort 11/30/2022   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   Loose stools 11/30/2022   Flatulence 11/30/2022   Belching 11/30/2022   Memory change 11/30/2022   Other allergic rhinitis 11/30/2022   Vitamin deficiency 11/30/2022   Hypercoagulable state due to persistent atrial fibrillation (HCC) 08/31/2022   Atrial fibrillation (HCC) 07/23/2022   AKI (acute kidney injury) (HCC) 07/23/2022   Sinus bradycardia 07/22/2022   Renal cyst 05/22/2021   Abnormal CT of the abdomen 05/22/2021   Benign prostatic hyperplasia with post-void dribbling 02/13/2021  History of urinary retention 02/03/2021   Skin cancer 01/09/2021   External hemorrhoid 01/09/2021   Chronic allergic rhinitis 11/12/2020   Acute cystitis without hematuria 04/05/2020   Urinary incontinence 04/05/2020   Balanitis 04/05/2020   Chronic constipation 09/22/2019   Impaired fasting blood sugar 09/22/2019   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 (HCC) 09/22/2019   DDD (degenerative disc disease), cervical 09/22/2019   Diverticulosis 09/22/2019   Hearing decreased, right 09/22/2019   History of compression fracture of spine 05/26/2018   Corn of foot 05/26/2018   Urinary retention 05/26/2018   Tremor 05/26/2018   Hyperlipidemia 02/18/2017   High risk medication use 09/24/2016   Advanced directives, counseling/discussion 07/30/2016   Allergic conjunctivitis, bilateral 07/30/2016   Osteoarthritis of both knees 07/30/2016   GERD (gastroesophageal reflux disease) 08/08/2015   Essential hypertension 02/12/2015   Encounter for health maintenance  examination in adult 02/12/2015   History of prostate cancer 02/12/2015   PCP:  Jac Canavan, PA-C Pharmacy:   [facility] [phone] [place], [place] - [address] [facility] [address] [facility] [address] [place] [address] Phone: [phone] Fax: [phone]     Social Drivers of Health (SDOH) Social History: SDOH Screenings   Food Insecurity: No Food Insecurity (06/27/2023)  Housing: Unknown (06/27/2023)  Transportation Needs: No Transportation Needs (06/27/2023)  Utilities: Not At Risk (06/27/2023)  Alcohol Screen: Low Risk  (12/22/2022)  Depression (PHQ2-9): Low Risk  (12/22/2022)  Financial Resource Strain: Low Risk  (12/22/2022)  Physical Activity: Inactive (12/22/2022)  Social Connections: Moderately Isolated (06/27/2023)  Stress: No Stress Concern Present (12/22/2022)  Tobacco Use: Low Risk  (06/27/2023)  Health Literacy: Adequate Health Literacy (12/22/2022)   SDOH Interventions: Food Insecurity Interventions: Intervention Not Indicated Housing Interventions: Intervention Not Indicated Transportation Interventions: Intervention Not Indicated Utilities Interventions: Intervention Not Indicated Social Connections Interventions: Intervention Not Indicated   Readmission Risk Interventions     No data to display

## 2023-07-02 NOTE — Progress Notes (Signed)
 PROGRESS NOTE    ZAMARIAN SCARANO  ZOX:[id number] DOB: October 07, 1940 DOA: 06/27/2023 PCP: Jac Canavan, PA-C    Brief Narrative:   DASHEL GOINES is a 83 y.o. male with past medical history significant of GERD, HTN, hyperlipidemia hx of prostate cancer, atrial fibrillation, DDD, BPH with hx of urinary retention  presented to ED after a fall. In ED, his workup significant for sepsis, secondary to bilateral pyelonephritis, as well he had evidence of urinary retention, patient was then admitted hospital for further evaluation and treatment.  He is noted to have some uptrend in leukocytosis as well as some constipation today.  Awaiting SNF placement.  Assessment & Plan:   Principal Problem:   Sepsis (HCC) Active Problems:   Rhabdomyolysis   Elevated troponin   Atrial fibrillation (HCC)   Essential hypertension   GERD (gastroesophageal reflux disease)   Hyperlipidemia   History of prostate cancer   Tremor   Urinary retention   Urethral stricture  Sepsis secondary to UTI with cystitis and bilateral perinephritis Continue Rocephin, IV fluids.  Blood cultures negative in 4 days.  Initial Procalcitonin elevated at 8.29.  Urine culture negative.  Blood cultures negative.  Plan for 5-7-day course of antibiotics due to complicated UTI including strictures need for suprapubic catheter.  Still with mild leukocytosis but no fever.  Trend for leukocytosis is increasing slightly.  Continue to follow.  Urinary retention with urinary strictures. History of prostate cancer Unable to put Foley catheter also IR was consulted for suprapubic catheter placement.  Status post suprapubic catheter placement 06/30/2023.  Has been having good urinary output. -Plan for exchange in 4-6 weeks per IR  Constipation -No bowel movement despite use of MiraLAX or Colace -Plan to give suppository 3/14   Rhabdomyolysis resolved Discontinue IV fluids.  CK now down to 310.  Continue to hold Lipitor until day of  discharge.   Elevated troponin Troponin flat, no chest pain. Likely demand ischemia in setting of sepsis/afib.  No further workup planned.   Atrial fibrillation  CHADS2VASC score of 3.  Status post  DCCV on 09/02/22. Off Eliquis due to hematuria, multiple falls, recurrent hemorrhagic cystitis, risks outweighed benefits.  This was discussed with the family by previous provider.  Anticoagulation has been discontinued.   Essential hypertension Continue Toprol-XL.  Restart lisinopril since blood pressure is elevated today and and renal function has stabilized.   GERD (gastroesophageal reflux disease) Continue Pepcid.   Hyperlipidemia Hold Lipitor in the context of rhabdomyolysis.   May resume on discharge.   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 Berneice Heinrich, this post total resection, and radiation, 1999, status post TURP in 2005 as discussed with urology, developed radiation cystitis, patient recurrent hemorrhagic cystitis.  status post suprapubic catheter placement by IR.   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 Recommend follow-up MRI in 2 years per radiology.  Debility, deconditioning. Seen by physical therapy and recommend skilled nursing facility placement.  DVT prophylaxis: subcu heparin   Code Status: Full code  Family Communication:  Spoke with patient's daughter in law Dr Fonda Kinder on 07/01/2023 and updated her about the clinical condition of the patient.Isac Caddy phone number [phone]  Disposition:   Status is: Inpatient   Consultants:  none  Subjective: Patient seen and evaluated at bedside.  Overall feeling well with no complaints or concerns noted aside from constipation.  He states he has not had  a bowel movement in the last 3 days.  Objective: Vitals:   07/01/23 2354 07/02/23 0500 07/02/23 0546 07/02/23 0730  BP: (!) 169/103 (!) 162/83  (!)  169/82  Pulse: 99 82  81  Resp: (!) 22 19  18   Temp: 99.6 F (37.6 C) 99.7 F (37.6 C)    TempSrc: Oral Oral    SpO2: 91% 91%  92%  Weight: 80.3 kg  80.4 kg   Height:        Intake/Output Summary (Last 24 hours) at 07/02/2023 0852 Last data filed at 07/02/2023 0547 Gross per 24 hour  Intake 460 ml  Output 1050 ml  Net -590 ml   Filed Weights   06/27/23 1221 07/01/23 2354 07/02/23 0546  Weight: 74.8 kg 80.3 kg 80.4 kg    Physical examination:  General:  Average built, not in obvious distress, appears frail, left forehead with bruise.  Mildly anxious HENT:   No scleral pallor or icterus noted. Oral mucosa is moist.  Chest:  Clear breath sounds.   CVS: S1 &S2 heard. No murmur.  Regular rate and rhythm. Abdomen: Soft, nontender, nondistended.  Suprapubic catheter in place with clear, yellow urine output.  Bowel sounds are heard.   Extremities: No cyanosis, clubbing or edema.  Peripheral pulses are palpable. Psych: Alert, awake and oriented, mildly anxious. CNS:  No cranial nerve deficits.  Moves all extremities. Skin: Warm and dry.  No rashes noted.   Data Reviewed: I have personally reviewed following labs and imaging studies  CBC: Recent Labs  Lab 06/28/23 0444 06/29/23 0424 06/30/23 0502 07/01/23 0440 07/02/23 0600  WBC 21.4* 16.5* 14.3* 13.4* 16.3*  HGB 12.5* 11.9* 12.2* 11.9* 12.1*  HCT 38.4* 35.6* 36.9* 36.0* 36.8*  MCV 91.0 87.7 89.1 89.3 89.3  PLT 161 164 148* 182 231    Basic Metabolic Panel: Recent Labs  Lab 06/27/23 1217 06/27/23 1226 06/27/23 1515 06/28/23 0444 06/29/23 0424 06/30/23 0502 07/01/23 0440 07/02/23 0600  NA 140   < >  --  140 141 138 142 138  K 3.5   < >  --  3.4* 3.8 4.0 3.8 3.7  CL 108   < >  --  112* 111 110 111 106  CO2 22  --   --  20* 23 24 25 22   GLUCOSE 186*   < >  --  147* 135* 139* 118* 139*  BUN 31*   < >  --  31* 28* 27* 26* 19  CREATININE 1.23   < >  --  1.14 0.88 1.20 0.97 0.87  CALCIUM 9.4  --   --  8.3* 8.2*  8.1* 8.2* 8.0*  MG 1.8  --   --  1.7 2.1 1.9 1.9  --   PHOS  --   --  2.4* 2.2* 2.7 2.2* 2.5  --    < > = values in this interval not displayed.    GFR: Estimated Creatinine Clearance: 63.2 mL/min (by C-G formula based on SCr of 0.87 mg/dL).  Liver Function Tests: Recent Labs  Lab 06/27/23 1217 06/28/23 0444  AST 32 69*  ALT 14 26  ALKPHOS 34* 34*  BILITOT 1.0 0.7  PROT 6.5 5.8*  ALBUMIN 2.7* 2.3*    CBG: Recent Labs  Lab 06/28/23 0756 07/01/23 1939  GLUCAP 155* 167*     Recent Results (from the past 240 hours)  Resp panel by RT-PCR (RSV, Flu A&B, Covid) Anterior Nasal Swab     Status: None  Collection Time: 06/27/23 12:21 PM   Specimen: Anterior Nasal Swab  Result Value Ref Range Status   SARS Coronavirus 2 by RT PCR NEGATIVE NEGATIVE Final   Influenza A by PCR NEGATIVE NEGATIVE Final   Influenza B by PCR NEGATIVE NEGATIVE Final    Comment: (NOTE) The Xpert Xpress SARS-CoV-2/FLU/RSV plus assay is intended as an aid in the diagnosis of influenza from Nasopharyngeal swab specimens and should not be used as a sole basis for treatment. Nasal washings and aspirates are unacceptable for Xpert Xpress SARS-CoV-2/FLU/RSV testing.  Fact Sheet for Patients: BloggerCourse.com  Fact Sheet for Healthcare Providers: SeriousBroker.it  This test is not yet approved or cleared by the [place] FDA and has been authorized for detection and/or diagnosis of SARS-CoV-2 by FDA under an Emergency Use Authorization (EUA). This EUA will remain in effect (meaning this test can be used) for the duration of the COVID-19 declaration under Section 564(b)(1) of the Act, 21 U.S.C. section 360bbb-3(b)(1), unless the authorization is terminated or revoked.     Resp Syncytial Virus by PCR NEGATIVE NEGATIVE Final    Comment: (NOTE) Fact Sheet for Patients: BloggerCourse.com  Fact Sheet for Healthcare  Providers: SeriousBroker.it  This test is not yet approved or cleared by the [place] FDA and has been authorized for detection and/or diagnosis of SARS-CoV-2 by FDA under an Emergency Use Authorization (EUA). This EUA will remain in effect (meaning this test can be used) for the duration of the COVID-19 declaration under Section 564(b)(1) of the Act, 21 U.S.C. section 360bbb-3(b)(1), unless the authorization is terminated or revoked.  Performed at [facility], [address], [place], [place] [address]   Blood Culture (routine x 2)     Status: None   Collection Time: 06/27/23  2:32 PM   Specimen: BLOOD  Result Value Ref Range Status   Specimen Description BLOOD SITE NOT SPECIFIED  Final   Special Requests   Final    BOTTLES DRAWN AEROBIC ONLY Blood Culture results may not be optimal due to an inadequate volume of blood received in culture bottles   Culture   Final    NO GROWTH 5 DAYS Performed at [facility], [address], [place], [place] [address]    Report Status 07/02/2023 FINAL  Final  Blood Culture (routine x 2)     Status: None   Collection Time: 06/27/23  2:37 PM   Specimen: BLOOD  Result Value Ref Range Status   Specimen Description BLOOD SITE NOT SPECIFIED  Final   Special Requests   Final    BOTTLES DRAWN AEROBIC ONLY Blood Culture results may not be optimal due to an inadequate volume of blood received in culture bottles   Culture   Final    NO GROWTH 5 DAYS Performed at [facility], [address] [place], [place] [address]    Report Status 07/02/2023 FINAL  Final  Urine culture     Status: Abnormal   Collection Time: 06/27/23  3:12 PM   Specimen: Urine, Clean Catch  Result Value Ref Range Status   Specimen Description URINE, CLEAN CATCH  Final   Special Requests   Final    NONE Performed at [facility], [address], [place], [place] [address]    Culture MULTIPLE SPECIES PRESENT, SUGGEST  RECOLLECTION (A)  Final   Report Status 06/28/2023 FINAL  Final  Urine Culture (for pregnant, neutropenic or urologic patients or patients with an indwelling urinary catheter)  Status: None   Collection Time: 06/28/23  2:41 PM   Specimen: Urine, Clean Catch  Result Value Ref Range Status   Specimen Description URINE, CLEAN CATCH  Final   Special Requests NONE  Final   Culture   Final    NO GROWTH Performed at [facility], 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 EXAM: CT and ultrasound guided placement of suprapubic catheter COMPARISON:  06/27/2023 MEDICATIONS: The patient is currently admitted to the hospital and receiving intravenous antibiotics. The antibiotics were administered within an appropriate time frame prior to the initiation of the procedure. ANESTHESIA/SEDATION: Moderate (conscious) sedation was employed during this procedure. A total of Versed 2 mg and Fentanyl 100 mcg was administered intravenously. Moderate Sedation Time: 10 minutes. The patient's level of consciousness and vital signs were monitored continuously by radiology nursing throughout the procedure under my direct supervision. CONTRAST:  None COMPLICATIONS: None immediate. PROCEDURE: RADIATION DOSE REDUCTION: This exam was performed according to the departmental dose-optimization program which includes automated exposure control, adjustment of the mA and/or kV according to patient size and/or use of iterative reconstruction technique.Informed written consent was obtained from the patient after a discussion of the risks, benefits and alternatives to treatment. The patient was placed supine on the CT gantry and a pre procedural CT was performed which demonstrated a partially distended  bladder delete that. The procedure was planned. A timeout was performed prior to the initiation of the procedure. The suprapubic region was prepped and draped in the usual sterile fashion. The overlying soft tissues were anesthetized with 1% lidocaine with epinephrine. Appropriate trajectory was planned with the use of a 22 gauge spinal needle. An 18 gauge trocar needle was advanced into the urinary bladder and a short Amplatz super stiff wire was coiled within the bladder. The tract was serially dilated allowing placement of a 16 French all-purpose pigtail drainage catheter. Appropriate positioning was confirmed with a limited postprocedural CT scan. There was clear urine returned through the newly placed tube. The tube was connected to a drainage bag and sutured in place. A dressing was placed. The patient tolerated the procedure well without immediate post procedural complication. IMPRESSION: Successful CT guided placement of a 52 [place] French pigtail suprapubic catheter. Marliss Coots, MD [facility] Radiology Electronically Signed   By: Marliss Coots M.D.   On: 06/30/2023 16:43     Scheduled Meds:  bisacodyl  10 mg Rectal Once   diclofenac Sodium  2 g Topical QID   docusate sodium  100 mg Oral BID   feeding supplement  237 mL Oral BID BM   heparin injection (subcutaneous)  5,000 Units Subcutaneous Q8H   lisinopril  20 mg Oral Daily   metoprolol succinate  25 mg Oral Daily   polyethylene glycol  17 g Oral Daily   sodium chloride flush  5 mL Intracatheter Q8H   Continuous Infusions:  cefTRIAXone (ROCEPHIN)  IV Stopped (07/01/23 1117)     LOS: 5 days   Jahmier Willadsen Hoover Brunette, DO [facility]  07/02/2023, 8:52 AM

## 2023-07-02 NOTE — Plan of Care (Signed)

## 2023-07-02 NOTE — Plan of Care (Signed)
  Problem: Education: Goal: Knowledge of General Education information will improve Description: Including pain rating scale, medication(s)/side effects and non-pharmacologic comfort measures Outcome: Completed/Met   Problem: Health Behavior/Discharge Planning: Goal: Ability to manage health-related needs will improve Outcome: Completed/Met   Problem: Clinical Measurements: Goal: Ability to maintain clinical measurements within normal limits will improve Outcome: Completed/Met Goal: Will remain free from infection Outcome: Completed/Met Goal: Diagnostic test results will improve Outcome: Completed/Met Goal: Respiratory complications will improve Outcome: Completed/Met Goal: Cardiovascular complication will be avoided Outcome: Completed/Met   Problem: Activity: Goal: Risk for activity intolerance will decrease Outcome: Completed/Met   Problem: Nutrition: Goal: Adequate nutrition will be maintained Outcome: Completed/Met   Problem: Coping: Goal: Level of anxiety will decrease Outcome: Completed/Met   Problem: Elimination: Goal: Will not experience complications related to bowel motility Outcome: Completed/Met Goal: Will not experience complications related to urinary retention Outcome: Completed/Met   Problem: Pain Managment: Goal: General experience of comfort will improve and/or be controlled Outcome: Completed/Met   Problem: Safety: Goal: Ability to remain free from injury will improve Outcome: Completed/Met   Problem: Skin Integrity: Goal: Risk for impaired skin integrity will decrease Outcome: Completed/Met   Problem: Education: Goal: Knowledge of disease or condition will improve Outcome: Completed/Met Goal: Understanding of medication regimen will improve Outcome: Completed/Met Goal: Individualized Educational Video(s) Outcome: Completed/Met   Problem: Activity: Goal: Ability to tolerate increased activity will improve Outcome: Completed/Met    Problem: Cardiac: Goal: Ability to achieve and maintain adequate cardiopulmonary perfusion will improve Outcome: Completed/Met   Problem: Health Behavior/Discharge Planning: Goal: Ability to safely manage health-related needs after discharge will improve Outcome: Completed/Met

## 2023-07-02 NOTE — Progress Notes (Signed)
 Patient left unit to go en route to [facility]. Picked up bu [facility]. Report called prior to transport and given to [place], LPN.

## 2023-07-02 NOTE — Plan of Care (Signed)
 Pt has rested quietly throughout the night with no distress noted. Alert and oriented. On room air. SR on the monitor. Supra pubic cath intact to BSD. No complaints voiced.     Problem: Education: Goal: Knowledge of General Education information will improve Description: Including pain rating scale, medication(s)/side effects and non-pharmacologic comfort measures Outcome: Progressing   Problem: Clinical Measurements: Goal: Respiratory complications will improve Outcome: Progressing Goal: Cardiovascular complication will be avoided Outcome: Progressing   Problem: Pain Managment: Goal: General experience of comfort will improve and/or be controlled Outcome: Progressing   Problem: Skin Integrity: Goal: Risk for impaired skin integrity will decrease Outcome: Progressing

## 2023-07-02 NOTE — TOC Progression Note (Addendum)
 Transition of Care [facility]) - Progression Note    Patient Details  Name: Matthew Mckay MRN: [id number] Date of Birth: [birth year]  Transition of Care [facility]) CM/SW Contact  Mearl Latin, LCSW Phone Number: 07/02/2023, 9:52 AM  Clinical Narrative:    9:52am-CSW spoke with patient's daughter, Matthew Mckay, and emailed SNF bed offers and ratings list securely to her at [email or web address]. She reported being hopeful for [facility]. CSW will follow up with them on their decision.   11:13 AM-[facility] able to accept patient today if stable. CSW left voicemail for daughter. CSW received insurance approval, Ref# [id number], effective 07/02/2023-07/06/2023.   Per MD, awaiting BM.   Daughter returned call and CSW provided update. Daughter is hopeful patient will be able to discharge today.   Expected Discharge Plan: Skilled Nursing Facility Barriers to Discharge: Continued Medical Work up, English as a second language teacher, SNF Pending bed offer  Expected Discharge Plan and Services In-house Referral: Clinical Social Work   Post Acute Care Choice: Skilled Nursing Facility Living arrangements for the past 2 months: Single Family Home                                       Social Determinants of Health (SDOH) Interventions SDOH Screenings   Food Insecurity: No Food Insecurity (06/27/2023)  Housing: Unknown (06/27/2023)  Transportation Needs: No Transportation Needs (06/27/2023)  Utilities: Not At Risk (06/27/2023)  Alcohol Screen: Low Risk  (12/22/2022)  Depression (PHQ2-9): Low Risk  (12/22/2022)  Financial Resource Strain: Low Risk  (12/22/2022)  Physical Activity: Inactive (12/22/2022)  Social Connections: Moderately Isolated (06/27/2023)  Stress: No Stress Concern Present (12/22/2022)  Tobacco Use: Low Risk  (06/27/2023)  Health Literacy: Adequate Health Literacy (12/22/2022)    Readmission Risk Interventions     No data to display

## 2023-07-05 DIAGNOSIS — K219 Gastro-esophageal reflux disease without esophagitis: Secondary | ICD-10-CM | POA: Diagnosis not present

## 2023-07-05 DIAGNOSIS — N3040 Irradiation cystitis without hematuria: Secondary | ICD-10-CM | POA: Diagnosis not present

## 2023-07-05 DIAGNOSIS — M6282 Rhabdomyolysis: Secondary | ICD-10-CM | POA: Diagnosis not present

## 2023-07-05 DIAGNOSIS — N3091 Cystitis, unspecified with hematuria: Secondary | ICD-10-CM | POA: Diagnosis not present

## 2023-07-05 DIAGNOSIS — A419 Sepsis, unspecified organism: Secondary | ICD-10-CM | POA: Diagnosis not present

## 2023-07-05 DIAGNOSIS — I4891 Unspecified atrial fibrillation: Secondary | ICD-10-CM | POA: Diagnosis not present

## 2023-07-05 DIAGNOSIS — R54 Age-related physical debility: Secondary | ICD-10-CM | POA: Diagnosis not present

## 2023-07-05 DIAGNOSIS — N390 Urinary tract infection, site not specified: Secondary | ICD-10-CM | POA: Diagnosis not present

## 2023-07-05 DIAGNOSIS — K5900 Constipation, unspecified: Secondary | ICD-10-CM | POA: Diagnosis not present

## 2023-07-05 DIAGNOSIS — Z9359 Other cystostomy status: Secondary | ICD-10-CM | POA: Diagnosis not present

## 2023-07-05 DIAGNOSIS — I10 Essential (primary) hypertension: Secondary | ICD-10-CM | POA: Diagnosis not present

## 2023-07-06 ENCOUNTER — Other Ambulatory Visit: Payer: Self-pay | Admitting: *Deleted

## 2023-07-06 DIAGNOSIS — M6281 Muscle weakness (generalized): Secondary | ICD-10-CM | POA: Diagnosis not present

## 2023-07-06 DIAGNOSIS — I4891 Unspecified atrial fibrillation: Secondary | ICD-10-CM | POA: Diagnosis not present

## 2023-07-06 DIAGNOSIS — Z9181 History of falling: Secondary | ICD-10-CM | POA: Diagnosis not present

## 2023-07-06 DIAGNOSIS — N35119 Postinfective urethral stricture, not elsewhere classified, male, unspecified: Secondary | ICD-10-CM | POA: Diagnosis not present

## 2023-07-06 DIAGNOSIS — R2681 Unsteadiness on feet: Secondary | ICD-10-CM | POA: Diagnosis not present

## 2023-07-06 NOTE — Patient Outreach (Signed)
 Per [facility], Mr. Dambrosia resides in [facility].  Screening for potential complex care management services as benefit of health plan and primary care provider.  Secure message and voicemail left for Terri, SNF social worker for collaboration about potential complex care management needs and transition plans.   Will continue to follow.   Raiford Noble, MSN, RN, BSN [place]  [facility] RN Post- Acute Care Manager Direct Dial: [phone]

## 2023-07-07 DIAGNOSIS — I10 Essential (primary) hypertension: Secondary | ICD-10-CM | POA: Diagnosis not present

## 2023-07-07 DIAGNOSIS — I4891 Unspecified atrial fibrillation: Secondary | ICD-10-CM | POA: Diagnosis not present

## 2023-07-07 DIAGNOSIS — Z9181 History of falling: Secondary | ICD-10-CM | POA: Diagnosis not present

## 2023-07-07 DIAGNOSIS — N3040 Irradiation cystitis without hematuria: Secondary | ICD-10-CM | POA: Diagnosis not present

## 2023-07-07 DIAGNOSIS — A419 Sepsis, unspecified organism: Secondary | ICD-10-CM | POA: Diagnosis not present

## 2023-07-07 DIAGNOSIS — N390 Urinary tract infection, site not specified: Secondary | ICD-10-CM | POA: Diagnosis not present

## 2023-07-07 DIAGNOSIS — Z9359 Other cystostomy status: Secondary | ICD-10-CM | POA: Diagnosis not present

## 2023-07-07 DIAGNOSIS — K219 Gastro-esophageal reflux disease without esophagitis: Secondary | ICD-10-CM | POA: Diagnosis not present

## 2023-07-07 DIAGNOSIS — K5900 Constipation, unspecified: Secondary | ICD-10-CM | POA: Diagnosis not present

## 2023-07-07 DIAGNOSIS — N3091 Cystitis, unspecified with hematuria: Secondary | ICD-10-CM | POA: Diagnosis not present

## 2023-07-07 DIAGNOSIS — M6282 Rhabdomyolysis: Secondary | ICD-10-CM | POA: Diagnosis not present

## 2023-07-09 DIAGNOSIS — M6281 Muscle weakness (generalized): Secondary | ICD-10-CM | POA: Diagnosis not present

## 2023-07-09 DIAGNOSIS — R2681 Unsteadiness on feet: Secondary | ICD-10-CM | POA: Diagnosis not present

## 2023-07-09 DIAGNOSIS — Z9181 History of falling: Secondary | ICD-10-CM | POA: Diagnosis not present

## 2023-07-09 DIAGNOSIS — K219 Gastro-esophageal reflux disease without esophagitis: Secondary | ICD-10-CM | POA: Diagnosis not present

## 2023-07-09 DIAGNOSIS — N390 Urinary tract infection, site not specified: Secondary | ICD-10-CM | POA: Diagnosis not present

## 2023-07-09 DIAGNOSIS — K5900 Constipation, unspecified: Secondary | ICD-10-CM | POA: Diagnosis not present

## 2023-07-09 DIAGNOSIS — M6282 Rhabdomyolysis: Secondary | ICD-10-CM | POA: Diagnosis not present

## 2023-07-09 DIAGNOSIS — N35119 Postinfective urethral stricture, not elsewhere classified, male, unspecified: Secondary | ICD-10-CM | POA: Diagnosis not present

## 2023-07-09 DIAGNOSIS — I10 Essential (primary) hypertension: Secondary | ICD-10-CM | POA: Diagnosis not present

## 2023-07-09 DIAGNOSIS — A419 Sepsis, unspecified organism: Secondary | ICD-10-CM | POA: Diagnosis not present

## 2023-07-09 DIAGNOSIS — Z9359 Other cystostomy status: Secondary | ICD-10-CM | POA: Diagnosis not present

## 2023-07-09 DIAGNOSIS — N3091 Cystitis, unspecified with hematuria: Secondary | ICD-10-CM | POA: Diagnosis not present

## 2023-07-09 DIAGNOSIS — N3040 Irradiation cystitis without hematuria: Secondary | ICD-10-CM | POA: Diagnosis not present

## 2023-07-09 DIAGNOSIS — I4891 Unspecified atrial fibrillation: Secondary | ICD-10-CM | POA: Diagnosis not present

## 2023-07-13 DIAGNOSIS — Z9181 History of falling: Secondary | ICD-10-CM | POA: Diagnosis not present

## 2023-07-13 DIAGNOSIS — K219 Gastro-esophageal reflux disease without esophagitis: Secondary | ICD-10-CM | POA: Diagnosis not present

## 2023-07-13 DIAGNOSIS — N3040 Irradiation cystitis without hematuria: Secondary | ICD-10-CM | POA: Diagnosis not present

## 2023-07-13 DIAGNOSIS — I4891 Unspecified atrial fibrillation: Secondary | ICD-10-CM | POA: Diagnosis not present

## 2023-07-13 DIAGNOSIS — Z9359 Other cystostomy status: Secondary | ICD-10-CM | POA: Diagnosis not present

## 2023-07-13 DIAGNOSIS — N35119 Postinfective urethral stricture, not elsewhere classified, male, unspecified: Secondary | ICD-10-CM | POA: Diagnosis not present

## 2023-07-13 DIAGNOSIS — A419 Sepsis, unspecified organism: Secondary | ICD-10-CM | POA: Diagnosis not present

## 2023-07-13 DIAGNOSIS — N390 Urinary tract infection, site not specified: Secondary | ICD-10-CM | POA: Diagnosis not present

## 2023-07-13 DIAGNOSIS — N3091 Cystitis, unspecified with hematuria: Secondary | ICD-10-CM | POA: Diagnosis not present

## 2023-07-13 DIAGNOSIS — M6282 Rhabdomyolysis: Secondary | ICD-10-CM | POA: Diagnosis not present

## 2023-07-13 DIAGNOSIS — I10 Essential (primary) hypertension: Secondary | ICD-10-CM | POA: Diagnosis not present

## 2023-07-13 DIAGNOSIS — K5900 Constipation, unspecified: Secondary | ICD-10-CM | POA: Diagnosis not present

## 2023-07-13 DIAGNOSIS — M6281 Muscle weakness (generalized): Secondary | ICD-10-CM | POA: Diagnosis not present

## 2023-07-13 DIAGNOSIS — R2681 Unsteadiness on feet: Secondary | ICD-10-CM | POA: Diagnosis not present

## 2023-07-14 DIAGNOSIS — N390 Urinary tract infection, site not specified: Secondary | ICD-10-CM | POA: Diagnosis not present

## 2023-07-14 DIAGNOSIS — A419 Sepsis, unspecified organism: Secondary | ICD-10-CM | POA: Diagnosis not present

## 2023-07-14 DIAGNOSIS — N3091 Cystitis, unspecified with hematuria: Secondary | ICD-10-CM | POA: Diagnosis not present

## 2023-07-14 DIAGNOSIS — N3040 Irradiation cystitis without hematuria: Secondary | ICD-10-CM | POA: Diagnosis not present

## 2023-07-14 DIAGNOSIS — I4891 Unspecified atrial fibrillation: Secondary | ICD-10-CM | POA: Diagnosis not present

## 2023-07-14 DIAGNOSIS — I10 Essential (primary) hypertension: Secondary | ICD-10-CM | POA: Diagnosis not present

## 2023-07-14 DIAGNOSIS — Z9181 History of falling: Secondary | ICD-10-CM | POA: Diagnosis not present

## 2023-07-14 DIAGNOSIS — K5900 Constipation, unspecified: Secondary | ICD-10-CM | POA: Diagnosis not present

## 2023-07-14 DIAGNOSIS — K219 Gastro-esophageal reflux disease without esophagitis: Secondary | ICD-10-CM | POA: Diagnosis not present

## 2023-07-14 DIAGNOSIS — Z9359 Other cystostomy status: Secondary | ICD-10-CM | POA: Diagnosis not present

## 2023-07-14 DIAGNOSIS — M6282 Rhabdomyolysis: Secondary | ICD-10-CM | POA: Diagnosis not present

## 2023-07-16 DIAGNOSIS — I10 Essential (primary) hypertension: Secondary | ICD-10-CM | POA: Diagnosis not present

## 2023-07-16 DIAGNOSIS — Z9181 History of falling: Secondary | ICD-10-CM | POA: Diagnosis not present

## 2023-07-16 DIAGNOSIS — A419 Sepsis, unspecified organism: Secondary | ICD-10-CM | POA: Diagnosis not present

## 2023-07-16 DIAGNOSIS — N3091 Cystitis, unspecified with hematuria: Secondary | ICD-10-CM | POA: Diagnosis not present

## 2023-07-16 DIAGNOSIS — M6282 Rhabdomyolysis: Secondary | ICD-10-CM | POA: Diagnosis not present

## 2023-07-16 DIAGNOSIS — K5900 Constipation, unspecified: Secondary | ICD-10-CM | POA: Diagnosis not present

## 2023-07-16 DIAGNOSIS — K219 Gastro-esophageal reflux disease without esophagitis: Secondary | ICD-10-CM | POA: Diagnosis not present

## 2023-07-16 DIAGNOSIS — N3040 Irradiation cystitis without hematuria: Secondary | ICD-10-CM | POA: Diagnosis not present

## 2023-07-16 DIAGNOSIS — Z9359 Other cystostomy status: Secondary | ICD-10-CM | POA: Diagnosis not present

## 2023-07-16 DIAGNOSIS — N390 Urinary tract infection, site not specified: Secondary | ICD-10-CM | POA: Diagnosis not present

## 2023-07-16 DIAGNOSIS — I4891 Unspecified atrial fibrillation: Secondary | ICD-10-CM | POA: Diagnosis not present

## 2023-07-18 ENCOUNTER — Other Ambulatory Visit (HOSPITAL_COMMUNITY): Payer: Self-pay | Admitting: Internal Medicine

## 2023-07-19 ENCOUNTER — Telehealth: Payer: Self-pay

## 2023-07-19 DIAGNOSIS — K219 Gastro-esophageal reflux disease without esophagitis: Secondary | ICD-10-CM | POA: Diagnosis not present

## 2023-07-19 DIAGNOSIS — Z556 Problems related to health literacy: Secondary | ICD-10-CM | POA: Diagnosis not present

## 2023-07-19 DIAGNOSIS — N12 Tubulo-interstitial nephritis, not specified as acute or chronic: Secondary | ICD-10-CM | POA: Diagnosis not present

## 2023-07-19 DIAGNOSIS — Z435 Encounter for attention to cystostomy: Secondary | ICD-10-CM | POA: Diagnosis not present

## 2023-07-19 DIAGNOSIS — R338 Other retention of urine: Secondary | ICD-10-CM | POA: Diagnosis not present

## 2023-07-19 DIAGNOSIS — Z9181 History of falling: Secondary | ICD-10-CM | POA: Diagnosis not present

## 2023-07-19 DIAGNOSIS — I4891 Unspecified atrial fibrillation: Secondary | ICD-10-CM | POA: Diagnosis not present

## 2023-07-19 DIAGNOSIS — M5030 Other cervical disc degeneration, unspecified cervical region: Secondary | ICD-10-CM | POA: Diagnosis not present

## 2023-07-19 DIAGNOSIS — A419 Sepsis, unspecified organism: Secondary | ICD-10-CM | POA: Diagnosis not present

## 2023-07-19 DIAGNOSIS — Z79899 Other long term (current) drug therapy: Secondary | ICD-10-CM | POA: Diagnosis not present

## 2023-07-19 DIAGNOSIS — N3040 Irradiation cystitis without hematuria: Secondary | ICD-10-CM | POA: Diagnosis not present

## 2023-07-19 DIAGNOSIS — K5900 Constipation, unspecified: Secondary | ICD-10-CM | POA: Diagnosis not present

## 2023-07-19 DIAGNOSIS — Z604 Social exclusion and rejection: Secondary | ICD-10-CM | POA: Diagnosis not present

## 2023-07-19 DIAGNOSIS — I10 Essential (primary) hypertension: Secondary | ICD-10-CM | POA: Diagnosis not present

## 2023-07-19 NOTE — Transitions of Care (Post Inpatient/ED Visit) (Signed)
 07/19/2023  Name: Matthew Mckay MRN: [id number] DOB: [birth year]  Today's TOC FU Call Status: Today's TOC FU Call Status:: Successful TOC FU Call Completed TOC FU Call Complete Date: 07/19/23 Patient's Name and Date of Birth confirmed.  Transition Care Management Follow-up Telephone Call Date of Discharge: 07/16/23 Discharge Facility: Other ([facility]) Name of Other ([facility]) Discharge Facility: [facility] Type of Discharge: Inpatient Admission Primary Inpatient Discharge Diagnosis:: unsteady gait How have you been since you were released from the hospital?: Better Any questions or concerns?: No  Items Reviewed: Did you receive and understand the discharge instructions provided?: Yes Medications obtained,verified, and reconciled?: Yes (Medications Reviewed) Any new allergies since your discharge?: No Dietary orders reviewed?: Yes Do you have support at home?: No  Medications Reviewed Today: Medications Reviewed Today     Reviewed by Karena Addison, LPN (Licensed Practical Nurse) on 07/19/23 at 1218  Med List Status: <None>   Medication Order Taking? Sig Documenting Provider Last Dose Status Informant  atorvastatin (LIPITOR) 10 MG tablet [id number] No Take 1 tablet (10 mg total) by mouth daily. Jac Canavan, PA-C 06/26/2023 Morning Active Child  Cholecalciferol (VITAMIN D3) 75 MCG (3000 UT) TABS [id number] No Take 75 mcg by mouth daily. [provider] 06/26/2023 Morning Active Child  docusate sodium (COLACE) 100 MG capsule [id number]  Take 1 capsule (100 mg total) by mouth 2 (two) times daily. Maurilio Lovely D, DO  Active   ELIQUIS 5 MG TABS tablet [id number] No TAKE 1 TABLET BY MOUTH TWICE A DAY Jac Canavan, PA-C 06/26/2023  7:30 AM Active Child  feeding supplement (ENSURE ENLIVE / ENSURE PLUS) LIQD [id number]  Take 237 mLs by mouth 2 (two) times daily between meals. Sherryll Burger, Pratik D, DO  Active   fluticasone (FLONASE) 50 MCG/ACT nasal spray [id number] No  Place 2 sprays into both nostrils daily. SPRAY 2 SPRAYS INTO EACH NOSTRIL EVERY DAY  Patient taking differently: Place 2 sprays into both nostrils daily as needed for allergies.   Tysinger, Kermit Balo, PA-C Past Week Active Child  levocetirizine (XYZAL) 5 MG tablet [id number] No TAKE 1 TABLET BY MOUTH EVERY DAY IN THE EVENING Jac Canavan, PA-C 06/26/2023 Evening Active Child  lisinopril (ZESTRIL) 20 MG tablet [id number] No Take 1 tablet (20 mg total) by mouth daily.  Patient not taking: Reported on 06/27/2023   Jac Canavan, PA-C Not Taking Active Child  metoprolol succinate (TOPROL-XL) 25 MG 24 hr tablet [id number]  TAKE 1 TABLET (25 MG TOTAL) BY MOUTH DAILY. APPT REQ FOR REFILL [id number] Eustace Pen PA-C  Active   Peppermint Oil (IBGARD) 90 MG CPCR [id number] No Take 1 tablet by mouth daily.  Patient not taking: Reported on 06/27/2023   Jac Canavan, PA-C Not Taking Active Child  polyethylene glycol (MIRALAX / GLYCOLAX) 17 g packet [id number]  Take 17 g by mouth daily. Maurilio Lovely D, DO  Active             Home Care and Equipment/Supplies: Were Home Health Services Ordered?: NA Any new equipment or medical supplies ordered?: NA  Functional Questionnaire: Do you need assistance with bathing/showering or dressing?: No Do you need assistance with meal preparation?: No Do you need assistance with eating?: No Do you have difficulty maintaining continence: Yes (bladder catheter) Do you need assistance with getting out of bed/getting out of a chair/moving?: No Do you have difficulty managing or taking your medications?: No  Follow up appointments reviewed: PCP Follow-up appointment confirmed?: Yes Date  of PCP follow-up appointment?: 07/21/23 Follow-up Provider: [facility] Follow-up appointment confirmed?: No Do you need transportation to your follow-up appointment?: No Do you understand care options if your condition(s) worsen?: Yes-patient verbalized  understanding    SIGNATURE Karena Addison, LPN [facility] Nurse Health Advisor Direct Dial [phone]

## 2023-07-20 ENCOUNTER — Telehealth: Payer: Self-pay | Admitting: Medical

## 2023-07-20 DIAGNOSIS — R338 Other retention of urine: Secondary | ICD-10-CM | POA: Diagnosis not present

## 2023-07-20 DIAGNOSIS — A419 Sepsis, unspecified organism: Secondary | ICD-10-CM | POA: Diagnosis not present

## 2023-07-20 DIAGNOSIS — Z604 Social exclusion and rejection: Secondary | ICD-10-CM | POA: Diagnosis not present

## 2023-07-20 DIAGNOSIS — I4891 Unspecified atrial fibrillation: Secondary | ICD-10-CM | POA: Diagnosis not present

## 2023-07-20 DIAGNOSIS — Z556 Problems related to health literacy: Secondary | ICD-10-CM | POA: Diagnosis not present

## 2023-07-20 DIAGNOSIS — K5900 Constipation, unspecified: Secondary | ICD-10-CM | POA: Diagnosis not present

## 2023-07-20 DIAGNOSIS — Z79899 Other long term (current) drug therapy: Secondary | ICD-10-CM | POA: Diagnosis not present

## 2023-07-20 DIAGNOSIS — Z9181 History of falling: Secondary | ICD-10-CM | POA: Diagnosis not present

## 2023-07-20 DIAGNOSIS — N12 Tubulo-interstitial nephritis, not specified as acute or chronic: Secondary | ICD-10-CM | POA: Diagnosis not present

## 2023-07-20 DIAGNOSIS — N3040 Irradiation cystitis without hematuria: Secondary | ICD-10-CM | POA: Diagnosis not present

## 2023-07-20 DIAGNOSIS — I10 Essential (primary) hypertension: Secondary | ICD-10-CM | POA: Diagnosis not present

## 2023-07-20 DIAGNOSIS — Z435 Encounter for attention to cystostomy: Secondary | ICD-10-CM | POA: Diagnosis not present

## 2023-07-20 DIAGNOSIS — M5030 Other cervical disc degeneration, unspecified cervical region: Secondary | ICD-10-CM | POA: Diagnosis not present

## 2023-07-20 DIAGNOSIS — K219 Gastro-esophageal reflux disease without esophagitis: Secondary | ICD-10-CM | POA: Diagnosis not present

## 2023-07-20 NOTE — Telephone Encounter (Signed)
 Caller/Agency: [facility]  Callback Number: [phone] Confidential Line  Service Requested: Physical Therapy Frequency: 1 week 1 2 week 2 1 week 6  Any new concerns about the patient? No

## 2023-07-20 NOTE — Telephone Encounter (Signed)
 Copied from CRM [id number]. Topic: Clinical - Home Health Verbal Orders >> Jul 20, 2023 11:23 AM Mayer Masker wrote: Caller/Agency: [facility]  Callback Number: [phone] Confidential Line  Service Requested: Physical Therapy Frequency: 1 week 1 2 week 2 1 week 6  Any new concerns about the patient? No

## 2023-07-20 NOTE — Telephone Encounter (Signed)
 Clearence Cheek was notified

## 2023-07-20 NOTE — Telephone Encounter (Signed)
 Marland Kitchen

## 2023-07-21 ENCOUNTER — Inpatient Hospital Stay: Admitting: Medical

## 2023-07-27 DIAGNOSIS — I10 Essential (primary) hypertension: Secondary | ICD-10-CM | POA: Diagnosis not present

## 2023-07-27 DIAGNOSIS — Z435 Encounter for attention to cystostomy: Secondary | ICD-10-CM | POA: Diagnosis not present

## 2023-07-27 DIAGNOSIS — K219 Gastro-esophageal reflux disease without esophagitis: Secondary | ICD-10-CM | POA: Diagnosis not present

## 2023-07-27 DIAGNOSIS — A419 Sepsis, unspecified organism: Secondary | ICD-10-CM | POA: Diagnosis not present

## 2023-07-27 DIAGNOSIS — R338 Other retention of urine: Secondary | ICD-10-CM | POA: Diagnosis not present

## 2023-07-27 DIAGNOSIS — Z9181 History of falling: Secondary | ICD-10-CM | POA: Diagnosis not present

## 2023-07-27 DIAGNOSIS — N12 Tubulo-interstitial nephritis, not specified as acute or chronic: Secondary | ICD-10-CM | POA: Diagnosis not present

## 2023-07-27 DIAGNOSIS — Z556 Problems related to health literacy: Secondary | ICD-10-CM | POA: Diagnosis not present

## 2023-07-27 DIAGNOSIS — Z79899 Other long term (current) drug therapy: Secondary | ICD-10-CM | POA: Diagnosis not present

## 2023-07-27 DIAGNOSIS — N3040 Irradiation cystitis without hematuria: Secondary | ICD-10-CM | POA: Diagnosis not present

## 2023-07-27 DIAGNOSIS — K5900 Constipation, unspecified: Secondary | ICD-10-CM | POA: Diagnosis not present

## 2023-07-27 DIAGNOSIS — I4891 Unspecified atrial fibrillation: Secondary | ICD-10-CM | POA: Diagnosis not present

## 2023-07-27 DIAGNOSIS — M5030 Other cervical disc degeneration, unspecified cervical region: Secondary | ICD-10-CM | POA: Diagnosis not present

## 2023-07-27 DIAGNOSIS — Z604 Social exclusion and rejection: Secondary | ICD-10-CM | POA: Diagnosis not present

## 2023-07-28 DIAGNOSIS — R338 Other retention of urine: Secondary | ICD-10-CM | POA: Diagnosis not present

## 2023-07-28 DIAGNOSIS — N3040 Irradiation cystitis without hematuria: Secondary | ICD-10-CM | POA: Diagnosis not present

## 2023-07-28 DIAGNOSIS — I10 Essential (primary) hypertension: Secondary | ICD-10-CM | POA: Diagnosis not present

## 2023-07-28 DIAGNOSIS — A419 Sepsis, unspecified organism: Secondary | ICD-10-CM | POA: Diagnosis not present

## 2023-07-28 DIAGNOSIS — I4891 Unspecified atrial fibrillation: Secondary | ICD-10-CM | POA: Diagnosis not present

## 2023-07-28 DIAGNOSIS — Z9181 History of falling: Secondary | ICD-10-CM | POA: Diagnosis not present

## 2023-07-28 DIAGNOSIS — Z435 Encounter for attention to cystostomy: Secondary | ICD-10-CM | POA: Diagnosis not present

## 2023-07-28 DIAGNOSIS — Z79899 Other long term (current) drug therapy: Secondary | ICD-10-CM | POA: Diagnosis not present

## 2023-07-28 DIAGNOSIS — K5900 Constipation, unspecified: Secondary | ICD-10-CM | POA: Diagnosis not present

## 2023-07-28 DIAGNOSIS — N12 Tubulo-interstitial nephritis, not specified as acute or chronic: Secondary | ICD-10-CM | POA: Diagnosis not present

## 2023-07-28 DIAGNOSIS — Z604 Social exclusion and rejection: Secondary | ICD-10-CM | POA: Diagnosis not present

## 2023-07-28 DIAGNOSIS — Z556 Problems related to health literacy: Secondary | ICD-10-CM | POA: Diagnosis not present

## 2023-07-28 DIAGNOSIS — M5030 Other cervical disc degeneration, unspecified cervical region: Secondary | ICD-10-CM | POA: Diagnosis not present

## 2023-07-28 DIAGNOSIS — K219 Gastro-esophageal reflux disease without esophagitis: Secondary | ICD-10-CM | POA: Diagnosis not present

## 2023-07-29 DIAGNOSIS — N320 Bladder-neck obstruction: Secondary | ICD-10-CM | POA: Diagnosis not present

## 2023-08-03 DIAGNOSIS — M5030 Other cervical disc degeneration, unspecified cervical region: Secondary | ICD-10-CM | POA: Diagnosis not present

## 2023-08-03 DIAGNOSIS — I4891 Unspecified atrial fibrillation: Secondary | ICD-10-CM | POA: Diagnosis not present

## 2023-08-03 DIAGNOSIS — Z79899 Other long term (current) drug therapy: Secondary | ICD-10-CM | POA: Diagnosis not present

## 2023-08-03 DIAGNOSIS — Z435 Encounter for attention to cystostomy: Secondary | ICD-10-CM | POA: Diagnosis not present

## 2023-08-03 DIAGNOSIS — K219 Gastro-esophageal reflux disease without esophagitis: Secondary | ICD-10-CM | POA: Diagnosis not present

## 2023-08-03 DIAGNOSIS — N12 Tubulo-interstitial nephritis, not specified as acute or chronic: Secondary | ICD-10-CM | POA: Diagnosis not present

## 2023-08-03 DIAGNOSIS — Z9181 History of falling: Secondary | ICD-10-CM | POA: Diagnosis not present

## 2023-08-03 DIAGNOSIS — Z604 Social exclusion and rejection: Secondary | ICD-10-CM | POA: Diagnosis not present

## 2023-08-03 DIAGNOSIS — R338 Other retention of urine: Secondary | ICD-10-CM | POA: Diagnosis not present

## 2023-08-03 DIAGNOSIS — I10 Essential (primary) hypertension: Secondary | ICD-10-CM | POA: Diagnosis not present

## 2023-08-03 DIAGNOSIS — N3040 Irradiation cystitis without hematuria: Secondary | ICD-10-CM | POA: Diagnosis not present

## 2023-08-03 DIAGNOSIS — K5900 Constipation, unspecified: Secondary | ICD-10-CM | POA: Diagnosis not present

## 2023-08-03 DIAGNOSIS — A419 Sepsis, unspecified organism: Secondary | ICD-10-CM | POA: Diagnosis not present

## 2023-08-03 DIAGNOSIS — Z556 Problems related to health literacy: Secondary | ICD-10-CM | POA: Diagnosis not present

## 2023-08-06 DIAGNOSIS — A419 Sepsis, unspecified organism: Secondary | ICD-10-CM | POA: Diagnosis not present

## 2023-08-06 DIAGNOSIS — Z79899 Other long term (current) drug therapy: Secondary | ICD-10-CM | POA: Diagnosis not present

## 2023-08-06 DIAGNOSIS — I4891 Unspecified atrial fibrillation: Secondary | ICD-10-CM | POA: Diagnosis not present

## 2023-08-06 DIAGNOSIS — Z435 Encounter for attention to cystostomy: Secondary | ICD-10-CM | POA: Diagnosis not present

## 2023-08-06 DIAGNOSIS — Z604 Social exclusion and rejection: Secondary | ICD-10-CM | POA: Diagnosis not present

## 2023-08-06 DIAGNOSIS — K5900 Constipation, unspecified: Secondary | ICD-10-CM | POA: Diagnosis not present

## 2023-08-06 DIAGNOSIS — I10 Essential (primary) hypertension: Secondary | ICD-10-CM | POA: Diagnosis not present

## 2023-08-06 DIAGNOSIS — Z9181 History of falling: Secondary | ICD-10-CM | POA: Diagnosis not present

## 2023-08-06 DIAGNOSIS — K219 Gastro-esophageal reflux disease without esophagitis: Secondary | ICD-10-CM | POA: Diagnosis not present

## 2023-08-06 DIAGNOSIS — Z556 Problems related to health literacy: Secondary | ICD-10-CM | POA: Diagnosis not present

## 2023-08-06 DIAGNOSIS — R338 Other retention of urine: Secondary | ICD-10-CM | POA: Diagnosis not present

## 2023-08-06 DIAGNOSIS — N12 Tubulo-interstitial nephritis, not specified as acute or chronic: Secondary | ICD-10-CM | POA: Diagnosis not present

## 2023-08-06 DIAGNOSIS — N3040 Irradiation cystitis without hematuria: Secondary | ICD-10-CM | POA: Diagnosis not present

## 2023-08-06 DIAGNOSIS — M5030 Other cervical disc degeneration, unspecified cervical region: Secondary | ICD-10-CM | POA: Diagnosis not present

## 2023-08-10 DIAGNOSIS — K5900 Constipation, unspecified: Secondary | ICD-10-CM | POA: Diagnosis not present

## 2023-08-10 DIAGNOSIS — Z9181 History of falling: Secondary | ICD-10-CM | POA: Diagnosis not present

## 2023-08-10 DIAGNOSIS — Z556 Problems related to health literacy: Secondary | ICD-10-CM | POA: Diagnosis not present

## 2023-08-10 DIAGNOSIS — Z79899 Other long term (current) drug therapy: Secondary | ICD-10-CM | POA: Diagnosis not present

## 2023-08-10 DIAGNOSIS — I10 Essential (primary) hypertension: Secondary | ICD-10-CM | POA: Diagnosis not present

## 2023-08-10 DIAGNOSIS — R338 Other retention of urine: Secondary | ICD-10-CM | POA: Diagnosis not present

## 2023-08-10 DIAGNOSIS — N12 Tubulo-interstitial nephritis, not specified as acute or chronic: Secondary | ICD-10-CM | POA: Diagnosis not present

## 2023-08-10 DIAGNOSIS — Z604 Social exclusion and rejection: Secondary | ICD-10-CM | POA: Diagnosis not present

## 2023-08-10 DIAGNOSIS — I4891 Unspecified atrial fibrillation: Secondary | ICD-10-CM | POA: Diagnosis not present

## 2023-08-10 DIAGNOSIS — A419 Sepsis, unspecified organism: Secondary | ICD-10-CM | POA: Diagnosis not present

## 2023-08-10 DIAGNOSIS — M5030 Other cervical disc degeneration, unspecified cervical region: Secondary | ICD-10-CM | POA: Diagnosis not present

## 2023-08-10 DIAGNOSIS — K219 Gastro-esophageal reflux disease without esophagitis: Secondary | ICD-10-CM | POA: Diagnosis not present

## 2023-08-10 DIAGNOSIS — N3040 Irradiation cystitis without hematuria: Secondary | ICD-10-CM | POA: Diagnosis not present

## 2023-08-10 DIAGNOSIS — Z435 Encounter for attention to cystostomy: Secondary | ICD-10-CM | POA: Diagnosis not present

## 2023-08-12 DIAGNOSIS — K5900 Constipation, unspecified: Secondary | ICD-10-CM | POA: Diagnosis not present

## 2023-08-12 DIAGNOSIS — I10 Essential (primary) hypertension: Secondary | ICD-10-CM | POA: Diagnosis not present

## 2023-08-12 DIAGNOSIS — N3040 Irradiation cystitis without hematuria: Secondary | ICD-10-CM | POA: Diagnosis not present

## 2023-08-12 DIAGNOSIS — N12 Tubulo-interstitial nephritis, not specified as acute or chronic: Secondary | ICD-10-CM | POA: Diagnosis not present

## 2023-08-12 DIAGNOSIS — N401 Enlarged prostate with lower urinary tract symptoms: Secondary | ICD-10-CM

## 2023-08-12 DIAGNOSIS — R41841 Cognitive communication deficit: Secondary | ICD-10-CM

## 2023-08-12 DIAGNOSIS — K219 Gastro-esophageal reflux disease without esophagitis: Secondary | ICD-10-CM | POA: Diagnosis not present

## 2023-08-12 DIAGNOSIS — Z435 Encounter for attention to cystostomy: Secondary | ICD-10-CM | POA: Diagnosis not present

## 2023-08-12 DIAGNOSIS — Z79899 Other long term (current) drug therapy: Secondary | ICD-10-CM | POA: Diagnosis not present

## 2023-08-12 DIAGNOSIS — I4891 Unspecified atrial fibrillation: Secondary | ICD-10-CM | POA: Diagnosis not present

## 2023-08-12 DIAGNOSIS — R338 Other retention of urine: Secondary | ICD-10-CM | POA: Diagnosis not present

## 2023-08-12 DIAGNOSIS — Z556 Problems related to health literacy: Secondary | ICD-10-CM | POA: Diagnosis not present

## 2023-08-12 DIAGNOSIS — A419 Sepsis, unspecified organism: Secondary | ICD-10-CM | POA: Diagnosis not present

## 2023-08-12 DIAGNOSIS — Z9181 History of falling: Secondary | ICD-10-CM | POA: Diagnosis not present

## 2023-08-12 DIAGNOSIS — C61 Malignant neoplasm of prostate: Secondary | ICD-10-CM | POA: Diagnosis not present

## 2023-08-12 DIAGNOSIS — Z604 Social exclusion and rejection: Secondary | ICD-10-CM | POA: Diagnosis not present

## 2023-08-12 DIAGNOSIS — M5030 Other cervical disc degeneration, unspecified cervical region: Secondary | ICD-10-CM | POA: Diagnosis not present

## 2023-08-16 DIAGNOSIS — Z9181 History of falling: Secondary | ICD-10-CM | POA: Diagnosis not present

## 2023-08-16 DIAGNOSIS — M5030 Other cervical disc degeneration, unspecified cervical region: Secondary | ICD-10-CM | POA: Diagnosis not present

## 2023-08-16 DIAGNOSIS — Z79899 Other long term (current) drug therapy: Secondary | ICD-10-CM | POA: Diagnosis not present

## 2023-08-16 DIAGNOSIS — N12 Tubulo-interstitial nephritis, not specified as acute or chronic: Secondary | ICD-10-CM | POA: Diagnosis not present

## 2023-08-16 DIAGNOSIS — I4891 Unspecified atrial fibrillation: Secondary | ICD-10-CM | POA: Diagnosis not present

## 2023-08-16 DIAGNOSIS — Z556 Problems related to health literacy: Secondary | ICD-10-CM | POA: Diagnosis not present

## 2023-08-16 DIAGNOSIS — K5900 Constipation, unspecified: Secondary | ICD-10-CM | POA: Diagnosis not present

## 2023-08-16 DIAGNOSIS — Z604 Social exclusion and rejection: Secondary | ICD-10-CM | POA: Diagnosis not present

## 2023-08-16 DIAGNOSIS — N3040 Irradiation cystitis without hematuria: Secondary | ICD-10-CM | POA: Diagnosis not present

## 2023-08-16 DIAGNOSIS — Z435 Encounter for attention to cystostomy: Secondary | ICD-10-CM | POA: Diagnosis not present

## 2023-08-16 DIAGNOSIS — K219 Gastro-esophageal reflux disease without esophagitis: Secondary | ICD-10-CM | POA: Diagnosis not present

## 2023-08-16 DIAGNOSIS — A419 Sepsis, unspecified organism: Secondary | ICD-10-CM | POA: Diagnosis not present

## 2023-08-16 DIAGNOSIS — I10 Essential (primary) hypertension: Secondary | ICD-10-CM | POA: Diagnosis not present

## 2023-08-16 DIAGNOSIS — R338 Other retention of urine: Secondary | ICD-10-CM | POA: Diagnosis not present

## 2023-08-17 DIAGNOSIS — Z556 Problems related to health literacy: Secondary | ICD-10-CM | POA: Diagnosis not present

## 2023-08-17 DIAGNOSIS — R338 Other retention of urine: Secondary | ICD-10-CM | POA: Diagnosis not present

## 2023-08-17 DIAGNOSIS — Z435 Encounter for attention to cystostomy: Secondary | ICD-10-CM | POA: Diagnosis not present

## 2023-08-17 DIAGNOSIS — K5900 Constipation, unspecified: Secondary | ICD-10-CM | POA: Diagnosis not present

## 2023-08-17 DIAGNOSIS — I4891 Unspecified atrial fibrillation: Secondary | ICD-10-CM | POA: Diagnosis not present

## 2023-08-17 DIAGNOSIS — Z9181 History of falling: Secondary | ICD-10-CM | POA: Diagnosis not present

## 2023-08-17 DIAGNOSIS — Z604 Social exclusion and rejection: Secondary | ICD-10-CM | POA: Diagnosis not present

## 2023-08-17 DIAGNOSIS — N12 Tubulo-interstitial nephritis, not specified as acute or chronic: Secondary | ICD-10-CM | POA: Diagnosis not present

## 2023-08-17 DIAGNOSIS — N3040 Irradiation cystitis without hematuria: Secondary | ICD-10-CM | POA: Diagnosis not present

## 2023-08-17 DIAGNOSIS — Z79899 Other long term (current) drug therapy: Secondary | ICD-10-CM | POA: Diagnosis not present

## 2023-08-17 DIAGNOSIS — K219 Gastro-esophageal reflux disease without esophagitis: Secondary | ICD-10-CM | POA: Diagnosis not present

## 2023-08-17 DIAGNOSIS — A419 Sepsis, unspecified organism: Secondary | ICD-10-CM | POA: Diagnosis not present

## 2023-08-17 DIAGNOSIS — I10 Essential (primary) hypertension: Secondary | ICD-10-CM | POA: Diagnosis not present

## 2023-08-17 DIAGNOSIS — M5030 Other cervical disc degeneration, unspecified cervical region: Secondary | ICD-10-CM | POA: Diagnosis not present

## 2023-08-18 ENCOUNTER — Other Ambulatory Visit: Payer: Self-pay | Admitting: Radiology

## 2023-08-18 DIAGNOSIS — R339 Retention of urine, unspecified: Secondary | ICD-10-CM

## 2023-08-19 NOTE — H&P (Signed)
 Chief Complaint: Urinary retention/bladder outlet obstruction, status post suprapubic catheter placement on 06/29/2023; referred today for suprapubic catheter exchange/upsizing  Referring Provider(s): Manny,T  Supervising Physician: Fernando Hoyer  Patient Status: Matthew Mckay - Out-pt  History of Present Illness: Matthew Mckay is an 83 y.o. male with past medical history of anxiety, diverticulosis, GERD, hyperlipidemia, hypertension, prostate cancer, A-fib, CHF, and urinary retention with bladder outlet obstruction.  He is status post suprapubic catheter placement on 06/29/2023 secondary to urinary retention in the setting of UTI/sepsis.  Urology was previously unable to pass a cystoscope into the distal urethra to visualize urethral lumen.  He presents today for image guided suprapubic catheter exchange and upsizing.   Patient is Full Code  Past Medical History:  Diagnosis Date   Allergy    Anxiety    Chronic sinusitis    Diverticulosis    sigmoid colon and increased vascularization due to prior radiation, per 2013 colonoscopy   GERD (gastroesophageal reflux disease)    Hemorrhoid    Hyperlipidemia    Hypertension    Prostate cancer (HCC)    history of radiation therapy   Seasonal allergies    Urinary retention 2019    Past Surgical History:  Procedure Laterality Date   CARDIOVERSION N/A 09/02/2022   Procedure: CARDIOVERSION;  Surgeon: Bridgette Campus, MD;  Location: [facility];  Service: Cardiovascular;  Laterality: N/A;   COLONOSCOPY  02/2012   sigmoid diverticulosis, polyps, Dr. Ozell Blunt   CYSTOSCOPY WITH URETHRAL DILATATION N/A 02/10/2019   Procedure: CYSTOSCOPY WITH URETHRAL DILATATION,;  Surgeon: Osborn Blaze, MD;  Location: [facility];  Service: Urology;  Laterality: N/A;   PROSTATECTOMY     1990s    Allergies: Pollen extract-tree extract [pollen extract]  Medications: Prior to Admission medications   Medication Sig Start Date End Date Taking?  Authorizing Provider  atorvastatin  (LIPITOR) 10 MG tablet Take 1 tablet (10 mg total) by mouth daily. 08/24/22   Tysinger, Christiane Cowing, PA-C  Cholecalciferol (VITAMIN D3) 75 MCG (3000 UT) TABS Take 75 mcg by mouth daily.    [provider]  docusate sodium  (COLACE) 100 MG capsule Take 1 capsule (100 mg total) by mouth 2 (two) times daily. 07/02/23   Mason Sole, Pratik D, DO  ELIQUIS  5 MG TABS tablet TAKE 1 TABLET BY MOUTH TWICE A DAY 11/30/22   Tysinger, Christiane Cowing, PA-C  feeding supplement (ENSURE ENLIVE / ENSURE PLUS) LIQD Take 237 mLs by mouth 2 (two) times daily between meals. 07/02/23   Mason Sole, Pratik D, DO  fluticasone  (FLONASE ) 50 MCG/ACT nasal spray Place 2 sprays into both nostrils daily. SPRAY 2 SPRAYS INTO EACH NOSTRIL EVERY DAY Patient taking differently: Place 2 sprays into both nostrils daily as needed for allergies. 11/30/22   Tysinger, Christiane Cowing, PA-C  levocetirizine (XYZAL ) 5 MG tablet TAKE 1 TABLET BY MOUTH EVERY DAY IN THE EVENING 11/30/22   Tysinger, Christiane Cowing, PA-C  lisinopril  (ZESTRIL ) 20 MG tablet Take 1 tablet (20 mg total) by mouth daily. Patient not taking: Reported on 06/27/2023 11/30/22   Tysinger, Christiane Cowing, PA-C  metoprolol  succinate (TOPROL -XL) 25 MG 24 hr tablet TAKE 1 TABLET (25 MG TOTAL) BY MOUTH DAILY. APPT REQ FOR REFILL [id number] 07/19/23   Nathanel Bal, PA-C  Peppermint Oil (IBGARD) 90 MG CPCR Take 1 tablet by mouth daily. Patient not taking: Reported on 06/27/2023 11/30/22   Tysinger, Christiane Cowing, PA-C  polyethylene glycol (MIRALAX  / GLYCOLAX ) 17 g packet Take 17 g by mouth daily.  07/03/23   Doreene Gammon D, DO     Family History  Problem Relation Age of Onset   Cancer Brother        stomach   Arthritis Brother    Arthritis Brother    Hypertension Brother    Hypertension Brother    Hypertension Mother    Cancer Father        lung, smoked 2 ppd   Cancer Paternal Uncle        cancer   Cancer Paternal Uncle        stomach    Social History   Socioeconomic History    Marital status: Widowed    Spouse name: Not on file   Number of children: Not on file   Years of education: Not on file   Highest education level: Not on file  Occupational History   Not on file  Tobacco Use   Smoking status: Never   Smokeless tobacco: Never  Vaping Use   Vaping status: Never Used  Substance and Sexual Activity   Alcohol  use: No   Drug use: No   Sexual activity: Not on file  Other Topics Concern   Not on file  Social History Narrative   Lives alone. Widowed in 2009.   Handles own ADLs.   [employer], always busy, yard work, garden work, works at [employer].    Goes to [employer].   6 brothers and 3 sisters.  Originally from [place], brought up in [place].  Father is [place], mother from [place]. Has family all over the place, [place], [place], [place] , [place]   Social Drivers of Health   Financial Resource Strain: Low Risk  (12/22/2022)   Overall Financial Resource Strain (CARDIA)    Difficulty of Paying Living Expenses: Not hard at all  Food Insecurity: No Food Insecurity (06/27/2023)   Hunger Vital Sign    Worried About Running Out of Food in the Last Year: Never true    Ran Out of Food in the Last Year: Never true  Transportation Needs: No Transportation Needs (06/27/2023)   PRAPARE - Administrator, Civil Service (Medical): No    Lack of Transportation (Non-Medical): No  Physical Activity: Inactive (12/22/2022)   Exercise Vital Sign    Days of Exercise per Week: 0 days    Minutes of Exercise per Session: 0 min  Stress: No Stress Concern Present (12/22/2022)   [employer] of Occupational Health - Occupational Stress Questionnaire    Feeling of Stress : Not at all  Social Connections: Moderately Isolated (06/27/2023)   Social Connection and Isolation Panel [NHANES]    Frequency of Communication with Friends and Family: More than three times a week    Frequency of Social Gatherings with Friends and Family: More than three times a week     Attends Religious Services: More than 4 times per year    Active Member of [employer] or Organizations: No    Attends Banker Meetings: Never    Marital Status: Widowed       Review of Systems denies fever,HA,CP,dyspnea, cough, back pain,N/V or bleeding; he does have some mild suprapubic /penile discomfort; he is anxious  Vital Signs: Vitals:   08/20/23 1159  BP: (!) 155/81  Pulse: 66  Resp: 18  Temp: 98.2 F (36.8 C)  SpO2: 97%      Advance Care Plan: no documents on file    Physical Exam: awake/alert; chest- CTA bilat; heart- RRR; abd-soft,+BS,intact SP  cath draining yellow urine; no LE edema  Imaging: No results found.  Labs:  CBC: Recent Labs    06/29/23 0424 06/30/23 0502 07/01/23 0440 07/02/23 0600  WBC 16.5* 14.3* 13.4* 16.3*  HGB 11.9* 12.2* 11.9* 12.1*  HCT 35.6* 36.9* 36.0* 36.8*  PLT 164 148* 182 231    COAGS: Recent Labs    02/25/23 2053 03/17/23 1943 06/27/23 1217 06/30/23 0502  INR 1.2 1.3* 1.3* 1.1    BMP: Recent Labs    06/29/23 0424 06/30/23 0502 07/01/23 0440 07/02/23 0600  NA 141 138 142 138  K 3.8 4.0 3.8 3.7  CL 111 110 111 106  CO2 23 24 25 22   GLUCOSE 135* 139* 118* 139*  BUN 28* 27* 26* 19  CALCIUM  8.2* 8.1* 8.2* 8.0*  CREATININE 0.88 1.20 0.97 0.87  GFRNONAA >60 60* >60 >60    LIVER FUNCTION TESTS: Recent Labs    02/25/23 2053 03/17/23 1943 06/27/23 1217 06/28/23 0444  BILITOT 0.5 0.7 1.0 0.7  AST 16 18 32 69*  ALT 15 16 14 26   ALKPHOS 42 51 34* 34*  PROT 7.2 8.0 6.5 5.8*  ALBUMIN 3.4* 3.9 2.7* 2.3*    TUMOR MARKERS: No results for input(s): "AFPTM", "CEA", "CA199", "CHROMGRNA" in the last 8760 hours.  Assessment and Plan: 83 y.o. male with past medical history of anxiety, diverticulosis, GERD, hyperlipidemia, hypertension, prostate cancer, A-fib, CHF, and urinary retention with bladder outlet obstruction.  He is status post suprapubic catheter placement on 06/29/2023 secondary to urinary  retention in the setting of UTI/sepsis.  Urology was previously unable to pass a cystoscope into the distal urethra to visualize urethral lumen.  He presents today for image guided suprapubic catheter exchange and upsizing.  Details/risks of procedure, including but not limited to, internal bleeding, infection, injury to adjacent structures discussed with patient with his understanding and consent.   Thank you for allowing our service to participate in MACARTHUR NIZIOLEK 's care.  Electronically Signed: D. Honore Lux, PA-C   08/19/2023, 4:26 PM      I spent a total of    25 Minutes in face to face in clinical consultation, greater than 50% of which was counseling/coordinating care for image guided suprapubic catheter exchange/upsizing

## 2023-08-20 ENCOUNTER — Ambulatory Visit (HOSPITAL_COMMUNITY)
Admission: RE | Admit: 2023-08-20 | Discharge: 2023-08-20 | Disposition: A | Discharge status: Home / Self Care | Source: Ambulatory Visit | Attending: Student | Admitting: Student

## 2023-08-20 ENCOUNTER — Encounter (HOSPITAL_COMMUNITY): Payer: Self-pay

## 2023-08-20 ENCOUNTER — Other Ambulatory Visit: Payer: Self-pay

## 2023-08-20 ENCOUNTER — Ambulatory Visit (HOSPITAL_COMMUNITY)
Admission: RE | Admit: 2023-08-20 | Discharge: 2023-08-20 | Disposition: A | Discharge status: Home / Self Care | Source: Ambulatory Visit | Attending: Student

## 2023-08-20 ENCOUNTER — Other Ambulatory Visit (HOSPITAL_COMMUNITY): Payer: Self-pay | Admitting: Student

## 2023-08-20 DIAGNOSIS — E785 Hyperlipidemia, unspecified: Secondary | ICD-10-CM | POA: Insufficient documentation

## 2023-08-20 DIAGNOSIS — K219 Gastro-esophageal reflux disease without esophagitis: Secondary | ICD-10-CM | POA: Insufficient documentation

## 2023-08-20 DIAGNOSIS — I110 Hypertensive heart disease with heart failure: Secondary | ICD-10-CM | POA: Diagnosis not present

## 2023-08-20 DIAGNOSIS — F419 Anxiety disorder, unspecified: Secondary | ICD-10-CM | POA: Diagnosis not present

## 2023-08-20 DIAGNOSIS — Z87898 Personal history of other specified conditions: Secondary | ICD-10-CM

## 2023-08-20 DIAGNOSIS — Z435 Encounter for attention to cystostomy: Secondary | ICD-10-CM | POA: Diagnosis not present

## 2023-08-20 DIAGNOSIS — I4891 Unspecified atrial fibrillation: Secondary | ICD-10-CM | POA: Diagnosis not present

## 2023-08-20 DIAGNOSIS — N320 Bladder-neck obstruction: Secondary | ICD-10-CM | POA: Insufficient documentation

## 2023-08-20 DIAGNOSIS — W19XXXA Unspecified fall, initial encounter: Secondary | ICD-10-CM

## 2023-08-20 DIAGNOSIS — Z8546 Personal history of malignant neoplasm of prostate: Secondary | ICD-10-CM | POA: Diagnosis not present

## 2023-08-20 DIAGNOSIS — Y92009 Unspecified place in unspecified non-institutional (private) residence as the place of occurrence of the external cause: Secondary | ICD-10-CM

## 2023-08-20 DIAGNOSIS — A419 Sepsis, unspecified organism: Secondary | ICD-10-CM

## 2023-08-20 DIAGNOSIS — I509 Heart failure, unspecified: Secondary | ICD-10-CM | POA: Insufficient documentation

## 2023-08-20 DIAGNOSIS — R339 Retention of urine, unspecified: Secondary | ICD-10-CM

## 2023-08-20 DIAGNOSIS — Z79899 Other long term (current) drug therapy: Secondary | ICD-10-CM | POA: Insufficient documentation

## 2023-08-20 HISTORY — PX: IR CATHETER TUBE CHANGE: IMG717

## 2023-08-20 LAB — CBC WITH DIFFERENTIAL/PLATELET
Abs Immature Granulocytes: 0.03 10*3/uL (ref 0.00–0.07)
Basophils Absolute: 0 10*3/uL (ref 0.0–0.1)
Basophils Relative: 0 %
Eosinophils Absolute: 0.2 10*3/uL (ref 0.0–0.5)
Eosinophils Relative: 2 %
HCT: 39.8 % (ref 39.0–52.0)
Hemoglobin: 12.7 g/dL — ABNORMAL LOW (ref 13.0–17.0)
Immature Granulocytes: 0 %
Lymphocytes Relative: 33 %
Lymphs Abs: 3 10*3/uL (ref 0.7–4.0)
MCH: 29.1 pg (ref 26.0–34.0)
MCHC: 31.9 g/dL (ref 30.0–36.0)
MCV: 91.1 fL (ref 80.0–100.0)
Monocytes Absolute: 0.7 10*3/uL (ref 0.1–1.0)
Monocytes Relative: 7 %
Neutro Abs: 5.3 10*3/uL (ref 1.7–7.7)
Neutrophils Relative %: 58 %
Platelets: 260 10*3/uL (ref 150–400)
RBC: 4.37 MIL/uL (ref 4.22–5.81)
RDW: 13.2 % (ref 11.5–15.5)
WBC: 9.2 10*3/uL (ref 4.0–10.5)
nRBC: 0 % (ref 0.0–0.2)

## 2023-08-20 LAB — BASIC METABOLIC PANEL WITH GFR
Anion gap: 8 (ref 5–15)
BUN: 23 mg/dL (ref 8–23)
CO2: 27 mmol/L (ref 22–32)
Calcium: 9.3 mg/dL (ref 8.9–10.3)
Chloride: 104 mmol/L (ref 98–111)
Creatinine, Ser: 0.83 mg/dL (ref 0.61–1.24)
GFR, Estimated: >60 mL/min (ref >60)
Glucose, Bld: 110 mg/dL — ABNORMAL HIGH (ref 70–99)
Potassium: 4.5 mmol/L (ref 3.5–5.1)
Sodium: 139 mmol/L (ref 135–145)

## 2023-08-20 LAB — PROTIME-INR
INR: 1.1 (ref 0.8–1.2)
Prothrombin Time: 14.1 s (ref 11.4–15.2)

## 2023-08-20 MED ORDER — SODIUM CHLORIDE 0.9 % IV SOLN: INTRAVENOUS | Status: DC

## 2023-08-20 MED ORDER — LIDOCAINE VISCOUS HCL 2 % MT SOLN
OROMUCOSAL | Status: AC
  Filled 2023-08-20: qty 15

## 2023-08-20 MED ORDER — LIDOCAINE HCL 1 % IJ SOLN: 20.0000 mL | Freq: Once | INTRAMUSCULAR | Status: DC

## 2023-08-20 MED ORDER — MIDAZOLAM HCL 2 MG/2ML IJ SOLN
INTRAMUSCULAR | Status: AC
  Filled 2023-08-20: qty 4

## 2023-08-20 MED ORDER — FENTANYL CITRATE (PF) 100 MCG/2ML IJ SOLN
INTRAMUSCULAR | Status: AC
Start: 2023-08-20 — End: ?
  Filled 2023-08-20: qty 2

## 2023-08-20 MED ORDER — FENTANYL CITRATE (PF) 100 MCG/2ML IJ SOLN
INTRAMUSCULAR | Status: AC | PRN
  Administered 2023-08-20 (×2): 50 ug via INTRAVENOUS

## 2023-08-20 MED ORDER — IOHEXOL 300 MG/ML  SOLN
50.0000 mL | Freq: Once | INTRAMUSCULAR | Status: AC | PRN
  Administered 2023-08-20: 10 mL

## 2023-08-20 MED ORDER — LIDOCAINE HCL 1 % IJ SOLN
INTRAMUSCULAR | Status: AC
  Filled 2023-08-20: qty 20

## 2023-08-20 MED ORDER — MIDAZOLAM HCL 2 MG/2ML IJ SOLN
INTRAMUSCULAR | Status: AC | PRN
  Administered 2023-08-20 (×2): 1 mg via INTRAVENOUS

## 2023-08-20 NOTE — Procedures (Signed)
 Interventional Radiology Procedure Note  Procedure: Successful upsize to 66F council-tip foley  Complications: None  Estimated Blood Loss: None  Recommendations: - DC home   Signed,  Roxie Cord, MD

## 2023-08-20 NOTE — Discharge Instructions (Signed)
 For questions /concerns may call Interventional Radiology at [phone] or  Interventional Radiology clinic [phone]   You may remove your dressing and shower tomorrow afternoon                                                Moderate Conscious Sedation, Adult, Care After After the procedure, it is common to have: Sleepiness for a few hours. Impaired judgment for a few hours. Trouble with balance. Nausea or vomiting if you eat too soon. Follow these instructions at home: For the time period you were told by your health care provider:  Rest. Do not participate in activities where you could fall or become injured. Do not drive or use machinery. Do not drink alcohol. Do not take sleeping pills or medicines that cause drowsiness. Do not make important decisions or sign legal documents. Do not take care of children on your own. Eating and drinking Follow instructions from your health care provider about what you may eat and drink. Drink enough fluid to keep your urine pale yellow. If you vomit: Drink clear fluids slowly and in small amounts as you are able. Clear fluids include water, ice chips, low-calorie sports drinks, and fruit juice that has water added to it (diluted fruit juice). Eat light and bland foods in small amounts as you are able. These foods include bananas, applesauce, rice, lean meats, toast, and crackers. General instructions Take over-the-counter and prescription medicines only as told by your health care provider. Have a responsible adult stay with you for the time you are told. Do not use any products that contain nicotine or tobacco. These products include cigarettes, chewing tobacco, and vaping devices, such as e-cigarettes. If you need help quitting, ask your health care provider. Return to your normal activities as told by your health care provider. Ask your health care provider what activities are safe for you. Your health care provider may give you more  instructions. Make sure you know what you can and cannot do. Contact a health care provider if: You are still sleepy or having trouble with balance after 24 hours. You feel light-headed. You vomit every time you eat or drink. You get a rash. You have a fever. You have redness or swelling around the IV site. Get help right away if: You have trouble breathing. You start to feel confused at home. These symptoms may be an emergency. Get help right away. Call 911. Do not wait to see if the symptoms will go away. Do not drive yourself to the hospital. This information is not intended to replace advice given to you by your health care provider. Make sure you discuss any questions you have with your health care provider. Document Revised: 10/20/2021 Document Reviewed: 10/20/2021 Elsevier Patient Education  2024 [employer].

## 2023-08-26 DIAGNOSIS — Z79899 Other long term (current) drug therapy: Secondary | ICD-10-CM | POA: Diagnosis not present

## 2023-08-26 DIAGNOSIS — N12 Tubulo-interstitial nephritis, not specified as acute or chronic: Secondary | ICD-10-CM | POA: Diagnosis not present

## 2023-08-26 DIAGNOSIS — Z604 Social exclusion and rejection: Secondary | ICD-10-CM | POA: Diagnosis not present

## 2023-08-26 DIAGNOSIS — K5900 Constipation, unspecified: Secondary | ICD-10-CM | POA: Diagnosis not present

## 2023-08-26 DIAGNOSIS — Z435 Encounter for attention to cystostomy: Secondary | ICD-10-CM | POA: Diagnosis not present

## 2023-08-26 DIAGNOSIS — R338 Other retention of urine: Secondary | ICD-10-CM | POA: Diagnosis not present

## 2023-08-26 DIAGNOSIS — I10 Essential (primary) hypertension: Secondary | ICD-10-CM | POA: Diagnosis not present

## 2023-08-26 DIAGNOSIS — A419 Sepsis, unspecified organism: Secondary | ICD-10-CM | POA: Diagnosis not present

## 2023-08-26 DIAGNOSIS — Z9181 History of falling: Secondary | ICD-10-CM | POA: Diagnosis not present

## 2023-08-26 DIAGNOSIS — M5030 Other cervical disc degeneration, unspecified cervical region: Secondary | ICD-10-CM | POA: Diagnosis not present

## 2023-08-26 DIAGNOSIS — K219 Gastro-esophageal reflux disease without esophagitis: Secondary | ICD-10-CM | POA: Diagnosis not present

## 2023-08-26 DIAGNOSIS — N3040 Irradiation cystitis without hematuria: Secondary | ICD-10-CM | POA: Diagnosis not present

## 2023-08-26 DIAGNOSIS — I4891 Unspecified atrial fibrillation: Secondary | ICD-10-CM | POA: Diagnosis not present

## 2023-08-26 DIAGNOSIS — Z556 Problems related to health literacy: Secondary | ICD-10-CM | POA: Diagnosis not present

## 2023-09-16 ENCOUNTER — Other Ambulatory Visit: Payer: Self-pay | Admitting: Medical

## 2023-10-04 DIAGNOSIS — N320 Bladder-neck obstruction: Secondary | ICD-10-CM | POA: Diagnosis not present

## 2023-10-08 ENCOUNTER — Other Ambulatory Visit (HOSPITAL_COMMUNITY): Payer: Self-pay | Admitting: Internal Medicine

## 2023-10-25 IMAGING — CT CT ABD-PELV W/ CM
1 of 3 series · 13 of 32 positions shown, 18 images · IV contrast (APPLIED)
Comparison: None.

CLINICAL DATA: Abdominal pain and recent weight loss

EXAM:
CT ABDOMEN AND PELVIS WITH CONTRAST
TECHNIQUE: Multidetector CT imaging of the abdomen and pelvis was performed
using the standard protocol following bolus administration of
intravenous contrast.

[Series 2: abd/pelvis w/cm · axial · 0.84mm/px · z∈[-500,-65]mm · 13 of 101 slices shown, 18 images]
[im 7/101  soft-tissue]
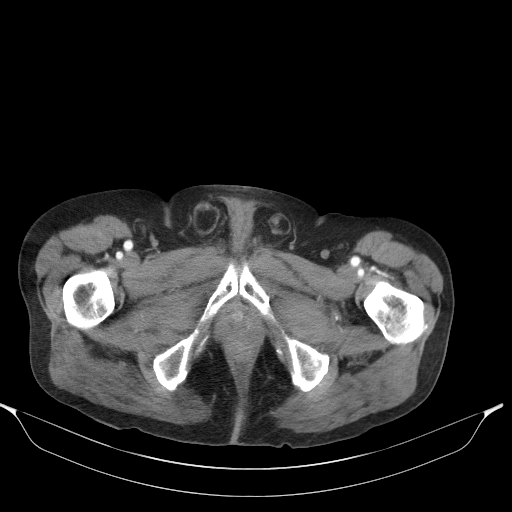
[im 7/101  bone]
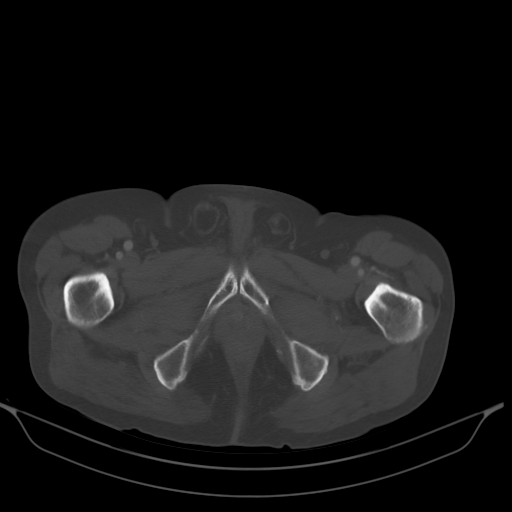
[im 14/101  soft-tissue]
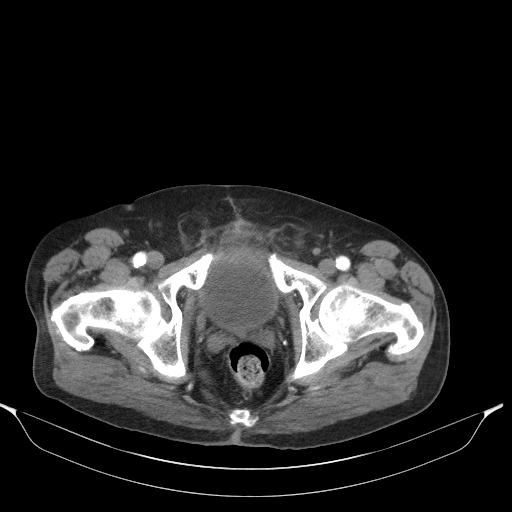
[im 21/101  soft-tissue]
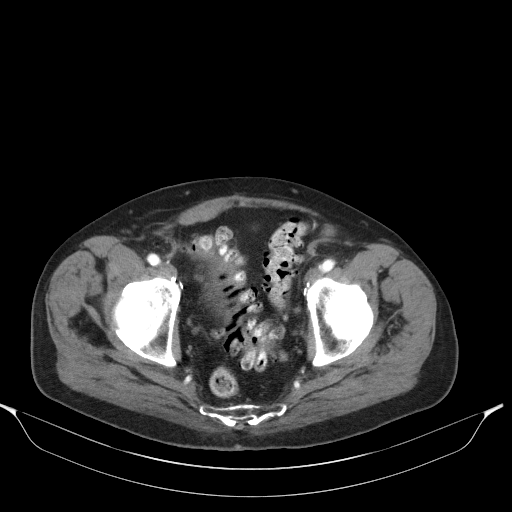
[im 34/101  soft-tissue]
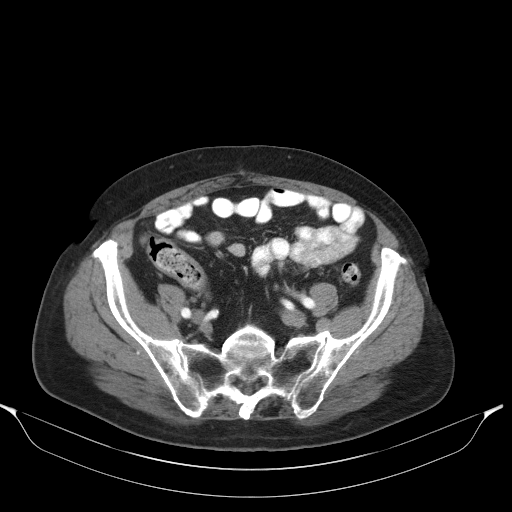
[im 41/101  soft-tissue]
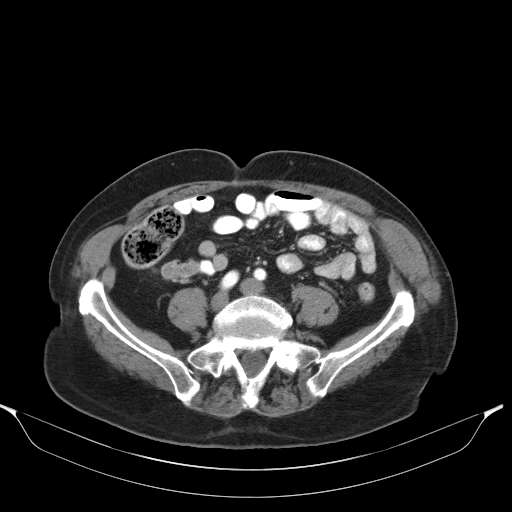
[im 47/101  soft-tissue]
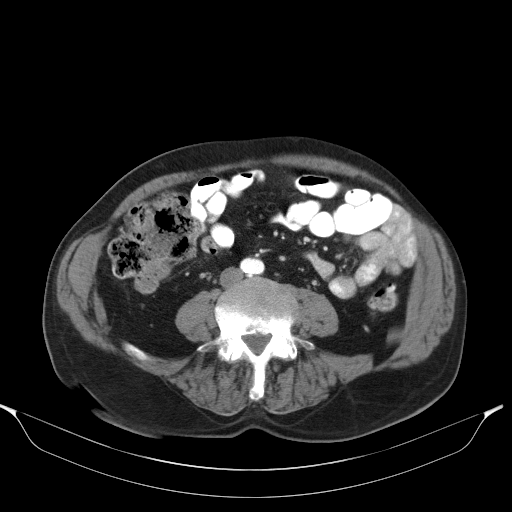
[im 54/101  soft-tissue]
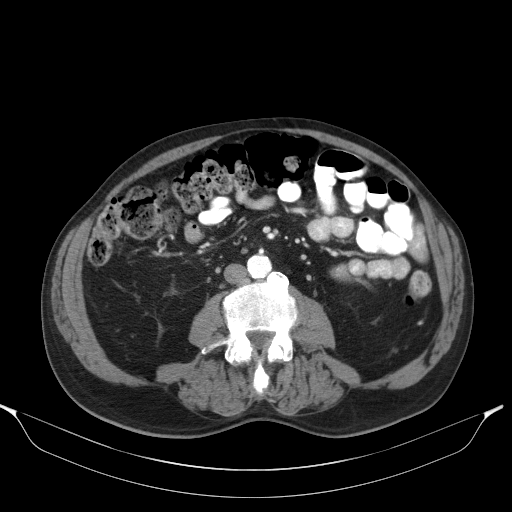
[im 61/101  soft-tissue]
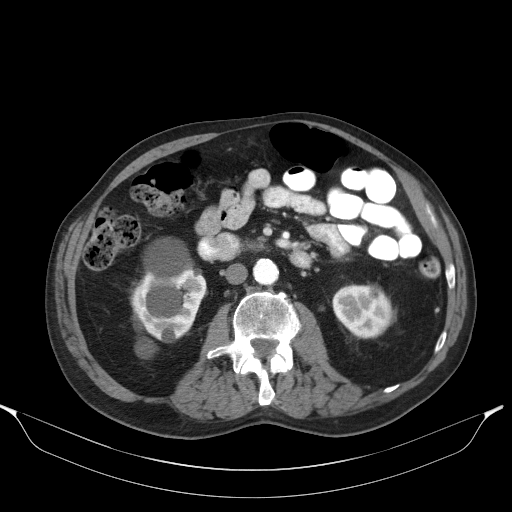
[im 67/101  soft-tissue]
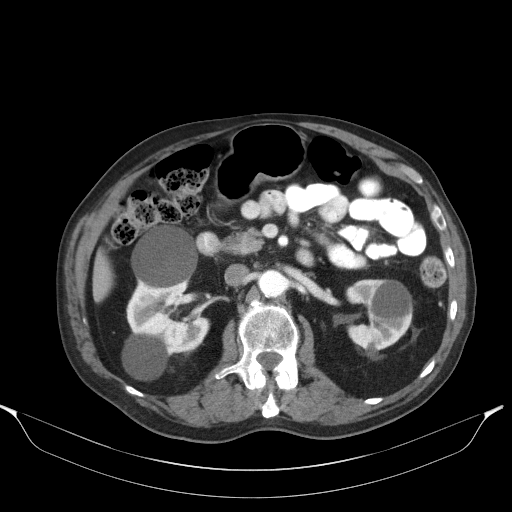
[im 67/101  bone]
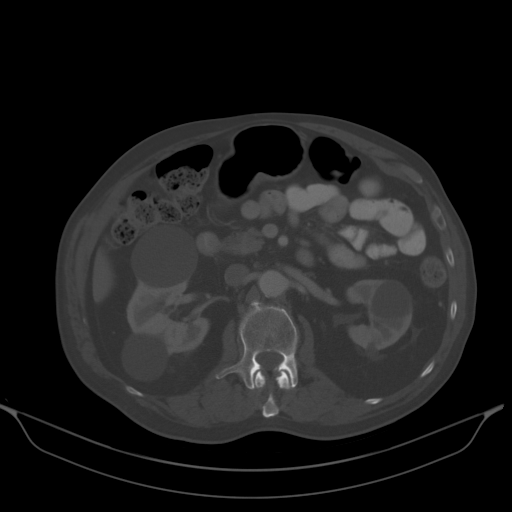
[im 74/101  lung]
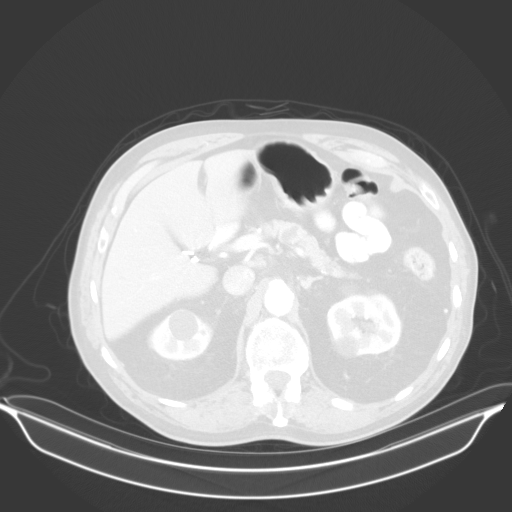
[im 81/101  soft-tissue]
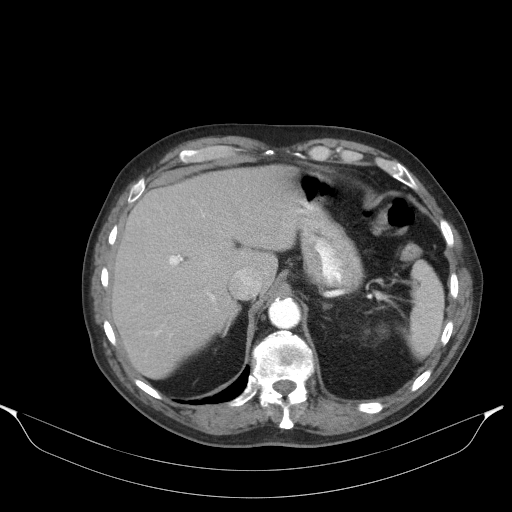
[im 81/101  lung]
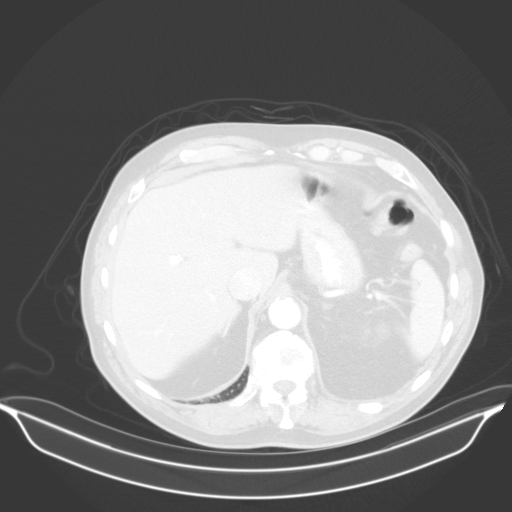
[im 87/101  soft-tissue]
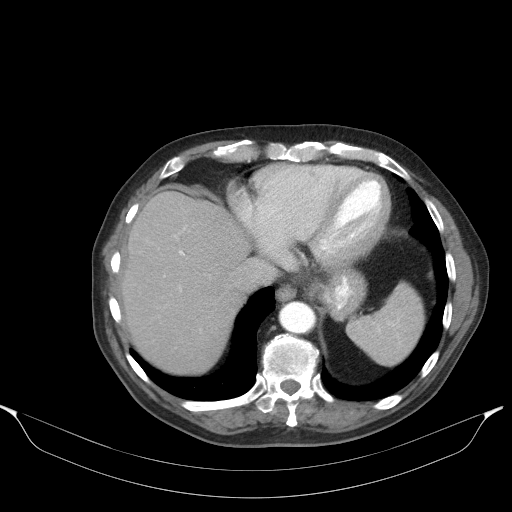
[im 87/101  lung]
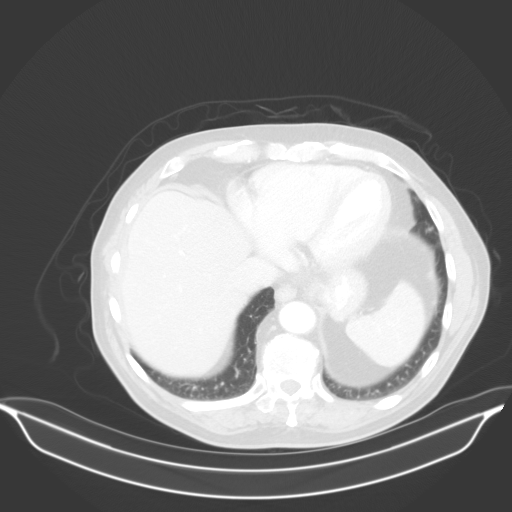
[im 94/101  soft-tissue]
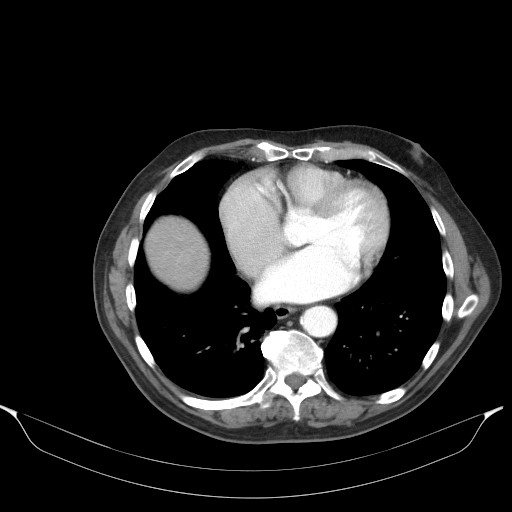
[im 94/101  lung]
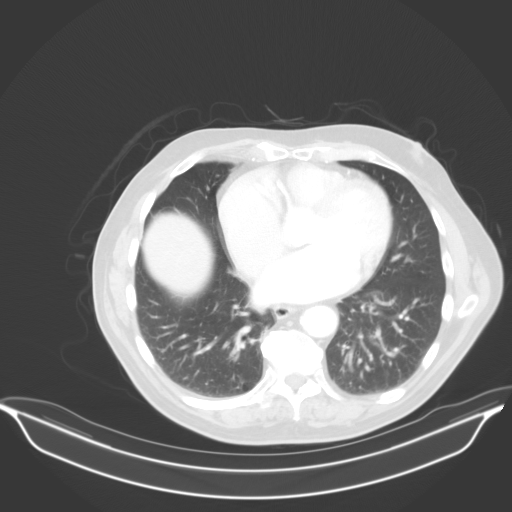

[13 of 32 positions shown; findings below may reference images not displayed]

RADIATION DOSE REDUCTION: This exam was performed according to the
departmental dose-optimization program which includes automated
exposure control, adjustment of the mA and/or kV according to
patient size and/or use of iterative reconstruction technique.

CONTRAST:  100mL [id number]

Creatinine was obtained on site at [HOSPITAL] at [HOSPITAL].

Results: Creatinine 0.9 mg/dL.
FINDINGS: Lower chest: No acute abnormality.

Hepatobiliary: Gallbladder has been surgically removed. Mild fatty
infiltration of the liver is noted.

Pancreas: Pancreas is well visualized. There is a cystic lesion
within the midportion of the pancreatic body best seen on image
number 28 of series 2 with some adjacent calcifications. It measures
1.5 x 1.1 cm in greatest dimension. No other definitive cystic
lesion is noted.

Spleen: Normal in size without focal abnormality.

Adrenals/Urinary Tract: Adrenal glands are within normal limits.
Multiple renal cysts are seen. The largest of these is noted on the
right measuring 5.5 cm. No renal calculi or obstructive changes are
seen. Normal excretion of contrast is noted on delayed images. The
bladder is within normal limits.

Stomach/Bowel: Scattered diverticular change of the colon is noted
without evidence of diverticulitis. The appendix is within normal
limits. Small bowel and stomach are unremarkable.

Vascular/Lymphatic: Aortic atherosclerosis. No enlarged abdominal or
pelvic lymph nodes.

Reproductive: There is a given history of prior prostatectomy
although some residual tissue is identified.

Other: No abdominal wall hernia or abnormality. No abdominopelvic
ascites.

Musculoskeletal: Chronic appearing T12 and L1 compression
deformities are noted. No acute bony abnormality is noted.
IMPRESSION: 1.5 x 1.1 cm cystic lesion within the midportion of the pancreatic
body. Given the lesion size and patient age, a follow-up CT
examination in 2 years is recommended.

Multiple renal cysts which appears simple in nature.

Diverticulosis without diverticulitis.

Fatty liver.

## 2023-11-22 ENCOUNTER — Other Ambulatory Visit: Payer: Self-pay | Admitting: Medical

## 2023-11-22 ENCOUNTER — Telehealth: Payer: Self-pay

## 2023-11-22 DIAGNOSIS — H1013 Acute atopic conjunctivitis, bilateral: Secondary | ICD-10-CM

## 2023-11-22 DIAGNOSIS — J3089 Other allergic rhinitis: Secondary | ICD-10-CM

## 2023-11-22 NOTE — Telephone Encounter (Signed)
 Patient is due for his CPE with [facility]. Please schedule

## 2023-11-22 NOTE — Telephone Encounter (Signed)
 Called Pt to schedule CPE, LVM to call back to schedule.

## 2023-11-25 DIAGNOSIS — N320 Bladder-neck obstruction: Secondary | ICD-10-CM | POA: Diagnosis not present

## 2023-12-11 ENCOUNTER — Other Ambulatory Visit: Payer: Self-pay | Admitting: Medical

## 2023-12-16 ENCOUNTER — Other Ambulatory Visit: Payer: Self-pay | Admitting: Medical

## 2023-12-23 DIAGNOSIS — N320 Bladder-neck obstruction: Secondary | ICD-10-CM | POA: Diagnosis not present

## 2023-12-28 ENCOUNTER — Ambulatory Visit (INDEPENDENT_AMBULATORY_CARE_PROVIDER_SITE_OTHER): Payer: Medicare Other

## 2023-12-28 DIAGNOSIS — Z0000 Encounter for general adult medical examination without abnormal findings: Secondary | ICD-10-CM

## 2023-12-28 NOTE — Patient Instructions (Signed)
 Matthew Mckay,  Thank you for taking the time for your Medicare Wellness Visit. I appreciate your continued commitment to your health goals. Please review the care plan we discussed, and feel free to reach out if I can assist you further.  Medicare recommends these wellness visits once per year to help you and your care team stay ahead of potential health issues. These visits are designed to focus on prevention, allowing your provider to concentrate on managing your acute and chronic conditions during your regular appointments.  Please note that Annual Wellness Visits do not include a physical exam. Some assessments may be limited, especially if the visit was conducted virtually. If needed, we may recommend a separate in-person follow-up with your provider.  Ongoing Care Seeing your primary care provider every 3 to 6 months helps us  monitor your health and provide consistent, personalized care.   Referrals If a referral was made during today's visit and you haven't received any updates within two weeks, please contact the referred provider directly to check on the status.  Recommended Screenings:  Health Maintenance  Topic Date Due   Zoster (Shingles) Vaccine (1 of 2) Never done   Flu Shot  11/19/2023   COVID-19 Vaccine (4 - 2025-26 season) 12/20/2023   Medicare Annual Wellness Visit  12/27/2024   DTaP/Tdap/Td vaccine (2 - Tdap) 04/09/2030   Pneumococcal Vaccine for age over 91  Completed   HPV Vaccine  Aged Out   Meningitis B Vaccine  Aged Out       12/28/2023   11:42 AM  Advanced Directives  Does Patient Have a Medical Advance Directive? Yes  Type of Advance Directive Healthcare Power of [facility]   Advance Care Planning is important because it: Ensures you receive medical care that aligns with your values, goals, and preferences. Provides guidance to your family and loved ones, reducing the emotional burden of decision-making during critical moments.  Vision: Annual vision  screenings are recommended for early detection of glaucoma, cataracts, and diabetic retinopathy. These exams can also reveal signs of chronic conditions such as diabetes and high blood pressure.  Dental: Annual dental screenings help detect early signs of oral cancer, gum disease, and other conditions linked to overall health, including heart disease and diabetes.  Please see the attached documents for additional preventive care recommendations.

## 2023-12-28 NOTE — Progress Notes (Signed)
 Subjective:   Matthew Mckay is a 83 y.o. who presents for a Medicare Wellness preventive visit.  As a reminder, Annual Wellness Visits don't include a physical exam, and some assessments may be limited, especially if this visit is performed virtually. We may recommend an in-person follow-up visit with your provider if needed.  Visit Complete: Virtual I connected with  Matthew Mckay on 12/28/23 by a audio enabled telemedicine application and verified that I am speaking with the correct person using two identifiers.  Patient Location: Home  Provider Location: Office/Clinic  I discussed the limitations of evaluation and management by telemedicine. The patient expressed understanding and agreed to proceed.  Vital Signs: Because this visit was a virtual/telehealth visit, some criteria may be missing or patient reported. Any vitals not documented were not able to be obtained and vitals that have been documented are patient reported.  VideoError- Librarian, academic were attempted between this provider and patient, however failed, due to patient having technical difficulties OR patient did not have access to video capability.  We continued and completed visit with audio only.   Persons Participating in Visit: Patient.  AWV Questionnaire: No: Patient Medicare AWV questionnaire was not completed prior to this visit.  Cardiac Risk Factors include: advanced age (>65men, >75 women);dyslipidemia;hypertension;male gender     Objective:    Today's Vitals   There is no height or weight on file to calculate BMI.     12/28/2023   11:42 AM 06/27/2023    8:00 PM 02/25/2023    8:52 PM 12/22/2022   12:08 PM 07/22/2022    4:00 PM 10/10/2021    9:18 AM 10/14/2019    5:31 AM  Advanced Directives  Does Patient Have a Medical Advance Directive? Yes No No No No No No  Type of Advance Directive Healthcare Power of Attorney        Would patient like information on creating a  medical advance directive?  No - Patient declined   No - Patient declined      Current Medications (verified) Outpatient Encounter Medications as of 12/28/2023  Medication Sig   atorvastatin  (LIPITOR) 10 MG tablet TAKE 1 TABLET BY MOUTH EVERY DAY   Cholecalciferol (VITAMIN D3) 75 MCG (3000 UT) TABS Take 75 mcg by mouth daily.   docusate sodium  (COLACE) 100 MG capsule Take 1 capsule (100 mg total) by mouth 2 (two) times daily.   ELIQUIS  5 MG TABS tablet TAKE 1 TABLET BY MOUTH TWICE A DAY   feeding supplement (ENSURE ENLIVE / ENSURE PLUS) LIQD Take 237 mLs by mouth 2 (two) times daily between meals.   fluticasone  (FLONASE ) 50 MCG/ACT nasal spray Place 2 sprays into both nostrils daily. SPRAY 2 SPRAYS INTO EACH NOSTRIL EVERY DAY (Patient taking differently: Place 2 sprays into both nostrils daily as needed for allergies.)   levocetirizine (XYZAL ) 5 MG tablet TAKE 1 TABLET BY MOUTH EVERY DAY IN THE EVENING   lisinopril  (ZESTRIL ) 20 MG tablet TAKE 1 TABLET BY MOUTH EVERY DAY   metoprolol  succinate (TOPROL -XL) 25 MG 24 hr tablet TAKE 1 TABLET (25 MG TOTAL) BY MOUTH DAILY. APPT REQ FOR REFILL [id number]   polyethylene glycol (MIRALAX  / GLYCOLAX ) 17 g packet Take 17 g by mouth daily.   Peppermint Oil (IBGARD) 90 MG CPCR Take 1 tablet by mouth daily. (Patient not taking: Reported on 12/28/2023)   No facility-administered encounter medications on file as of 12/28/2023.    Allergies (verified) Pollen extract-tree extract [pollen extract]  History: Past Medical History:  Diagnosis Date   Allergy    Anxiety    Chronic sinusitis    Diverticulosis    sigmoid colon and increased vascularization due to prior radiation, per 2013 colonoscopy   GERD (gastroesophageal reflux disease)    Hemorrhoid    Hyperlipidemia    Hypertension    Prostate cancer (HCC)    history of radiation therapy   Seasonal allergies    Urinary retention 2019   Past Surgical History:  Procedure Laterality Date   CARDIOVERSION  N/A 09/02/2022   Procedure: CARDIOVERSION;  Surgeon: Alvan Ronal BRAVO, MD;  Location: [facility];  Service: Cardiovascular;  Laterality: N/A;   COLONOSCOPY  02/2012   sigmoid diverticulosis, polyps, Dr. Oliva Boots   CYSTOSCOPY WITH URETHRAL DILATATION N/A 02/10/2019   Procedure: CYSTOSCOPY WITH URETHRAL DILATATION,;  Surgeon: Alvaro Hummer, MD;  Location: [facility];  Service: Urology;  Laterality: N/A;   IR CATHETER TUBE CHANGE  08/20/2023   PROSTATECTOMY     1990s   Family History  Problem Relation Age of Onset   Cancer Brother        stomach   Arthritis Brother    Arthritis Brother    Hypertension Brother    Hypertension Brother    Hypertension Mother    Cancer Father        lung, smoked 2 ppd   Cancer Paternal Uncle        cancer   Cancer Paternal Uncle        stomach   Social History   Socioeconomic History   Marital status: Widowed    Spouse name: Not on file   Number of children: Not on file   Years of education: Not on file   Highest education level: Not on file  Occupational History   Not on file  Tobacco Use   Smoking status: Never   Smokeless tobacco: Never  Vaping Use   Vaping status: Never Used  Substance and Sexual Activity   Alcohol  use: No   Drug use: No   Sexual activity: Not on file  Other Topics Concern   Not on file  Social History Narrative   Lives alone. Widowed in 2009.   Handles own ADLs.   Watches [employer], always busy, yard work, garden work, works at [employer].    Goes to [place]. [place].   6 brothers and 3 sisters.  Originally from [place], brought up in [place].  Father is [place], mother from [place]. Has family all over the place, [place], [place], [place] , [place]   Social Drivers of [employer]: Low Risk  (12/28/2023)   Overall Financial Resource Strain (CARDIA)    Difficulty of Paying Living Expenses: Not hard at all  Food Insecurity: No Food Insecurity (12/28/2023)   Hunger Vital Sign    Worried About Running  Out of Food in the Last Year: Never true    Ran Out of Food in the Last Year: Never true  Transportation Needs: No Transportation Needs (12/28/2023)   PRAPARE - Administrator, Civil Service (Medical): No    Lack of Transportation (Non-Medical): No  Physical Activity: Sufficiently Active (12/28/2023)   Exercise Vital Sign    Days of Exercise per Week: 7 days    Minutes of Exercise per Session: 30 min  Stress: No Stress Concern Present (12/28/2023)   [employer] of Occupational Health - Occupational Stress Questionnaire    Feeling of Stress: Not at  all  Social Connections: Moderately Isolated (12/28/2023)   Social Connection and Isolation Panel    Frequency of Communication with Friends and Family: More than three times a week    Frequency of Social Gatherings with Friends and Family: More than three times a week    Attends Religious Services: More than 4 times per year    Active Member of [employer] or Organizations: No    Attends Banker Meetings: Never    Marital Status: Widowed    Tobacco Counseling Counseling given: Not Answered    Clinical Intake:  Pre-visit preparation completed: Yes  Pain : No/denies pain     Nutritional Risks: None Diabetes: No  Lab Results  Component Value Date   HGBA1C 6.4 (H) 11/30/2022   HGBA1C 6.1 (H) 12/12/2021   HGBA1C 5.9 (H) 05/22/2021     How often do you need to have someone help you when you read instructions, pamphlets, or other written materials from your doctor or pharmacy?: 1 - Never  Interpreter Needed?: No  Information entered by :: NAllen LPN   Activities of Daily Living     12/28/2023   11:35 AM 06/27/2023    8:00 PM  In your present state of health, do you have any difficulty performing the following activities:  Hearing? 0 0  Vision? 0 0  Difficulty concentrating or making decisions? 0 0  Walking or climbing stairs? 0   Dressing or bathing? 0   Doing errands, shopping? 0 0  Preparing Food and  eating ? N   Using the Toilet? N   In the past six months, have you accidently leaked urine? N   Do you have problems with loss of bowel control? N   Managing your Medications? Y   Managing your Finances? N   Housekeeping or managing your Housekeeping? N     Patient Care Team: Tysinger, Alm RAMAN, PA-C as PCP - General (Family Medicine) Lovie Arlyss CROME, MD as Consulting Physician (Urology) I have updated your Care Teams any recent Medical Services you may have received from other providers in the past year.     Assessment:   This is a routine wellness examination for Achillies.  Hearing/Vision screen Hearing Screening - Comments:: Denies hearing issues Vision Screening - Comments:: Regular eye exams, [facility]   Goals Addressed             This Visit's Progress    Patient Stated       12/28/2023, stay healthy       Depression Screen     12/28/2023   11:45 AM 12/22/2022   12:11 PM 12/12/2021   10:29 AM 10/10/2021    9:20 AM 05/22/2021    9:41 AM 02/13/2021   10:48 AM 01/09/2021    8:46 AM  PHQ 2/9 Scores  PHQ - 2 Score 0 0 0 0 0 0 0  PHQ- 9 Score 2 0  0       Fall Risk     12/28/2023   11:43 AM 12/22/2022   12:09 PM 12/12/2021   10:28 AM 10/10/2021    9:20 AM 05/22/2021    9:41 AM  Fall Risk   Falls in the past year? 0 0 0 0 0  Number falls in past yr: 0 0 0 0 0  Injury with Fall? 0 0 0 0 0  Risk for fall due to : Impaired balance/gait;Impaired mobility;Medication side effect Medication side effect No Fall Risks Medication side effect No Fall  Risks  Follow up Falls evaluation completed;Falls prevention discussed Falls prevention discussed;Falls evaluation completed Falls evaluation completed  Falls evaluation completed;Education provided;Falls prevention discussed  Falls evaluation completed      Data saved with a previous flowsheet row definition    MEDICARE RISK AT HOME:  Medicare Risk at Home Any stairs in or around the home?: Yes If so, are there any  without handrails?: No Home free of loose throw rugs in walkways, pet beds, electrical cords, etc?: Yes Adequate lighting in your home to reduce risk of falls?: Yes Life alert?: No Use of a cane, walker or w/c?: No Grab bars in the bathroom?: No Shower chair or bench in shower?: No Elevated toilet seat or a handicapped toilet?: No  TIMED UP AND GO:  Was the test performed?  No  Cognitive Function: 6CIT completed        12/28/2023   11:46 AM 12/22/2022   12:12 PM 10/10/2021    9:23 AM  6CIT Screen  What Year? 0 points 0 points 0 points  What month? 0 points 0 points 0 points  What time? 0 points 0 points 0 points  Count back from 20 0 points 0 points 0 points  Months in reverse 4 points 4 points 0 points  Repeat phrase 0 points 2 points 4 points  Total Score 4 points 6 points 4 points    Immunizations Immunization History  Administered Date(s) Administered   Fluad Quad(high Dose 65+) 01/20/2019, 02/08/2020, 02/13/2021, 02/16/2022   Fluad Trivalent(High Dose 65+) 02/08/2023   INFLUENZA, HIGH DOSE SEASONAL PF 01/31/2013, 01/26/2014, 01/21/2015, 01/21/2016, 02/04/2017, 01/27/2018   Influenza Split 03/27/2011, 01/29/2012   PFIZER(Purple Top)SARS-COV-2 Vaccination 06/11/2019, 07/05/2019, 04/11/2020   Pneumococcal Conjugate-13 02/12/2015   Pneumococcal Polysaccharide-23 03/27/2011   Td 04/09/2020    Screening Tests Health Maintenance  Topic Date Due   Zoster Vaccines- Shingrix (1 of 2) Never done   Influenza Vaccine  11/19/2023   COVID-19 Vaccine (4 - 2025-26 season) 12/20/2023   Medicare Annual Wellness (AWV)  12/27/2024   DTaP/Tdap/Td (2 - Tdap) 04/09/2030   Pneumococcal Vaccine: 50+ Years  Completed   HPV VACCINES  Aged Out   Meningococcal B Vaccine  Aged Out    Health Maintenance Items Addressed: Due for shingles, flu and covid vaccine.  Additional Screening:  Vision Screening: Recommended annual ophthalmology exams for early detection of glaucoma and other  disorders of the eye. Is the patient up to date with their annual eye exam?  Yes  Who is the provider or what is the name of the office in which the patient attends annual eye exams? [facility]  Dental Screening: Recommended annual dental exams for proper oral hygiene  Community Resource Referral / Chronic Care Management: CRR required this visit?  No   CCM required this visit?  No   Plan:    I have personally reviewed and noted the following in the patient's chart:   Medical and social history Use of alcohol , tobacco or illicit drugs  Current medications and supplements including opioid prescriptions. Patient is not currently taking opioid prescriptions. Functional ability and status Nutritional status Physical activity Advanced directives List of other physicians Hospitalizations, surgeries, and ER visits in previous 12 months Vitals Screenings to include cognitive, depression, and falls Referrals and appointments  In addition, I have reviewed and discussed with patient certain preventive protocols, quality metrics, and best practice recommendations. A written personalized care plan for preventive services as well as general preventive health recommendations were provided  to patient.   Ardella FORBES Dawn, LPN   [id number]   After Visit Summary: (Pick Up) Due to this being a telephonic visit, with patients personalized plan was offered to patient and patient has requested to Pick up at office.  Notes: Nothing significant to report at this time.

## 2024-01-15 ENCOUNTER — Other Ambulatory Visit: Payer: Self-pay | Admitting: Medical

## 2024-01-17 NOTE — Telephone Encounter (Signed)
 Patient is overdue for a visit with Ludie. Please Schedule

## 2024-01-17 NOTE — Telephone Encounter (Signed)
Left message needs appt.

## 2024-01-21 ENCOUNTER — Encounter: Payer: Self-pay | Admitting: Family Medicine

## 2024-01-21 ENCOUNTER — Ambulatory Visit: Admitting: Family Medicine

## 2024-01-21 VITALS — BP 140/84 | HR 65 | Wt 168.4 lb

## 2024-01-21 DIAGNOSIS — H60502 Unspecified acute noninfective otitis externa, left ear: Secondary | ICD-10-CM | POA: Diagnosis not present

## 2024-01-21 DIAGNOSIS — H1013 Acute atopic conjunctivitis, bilateral: Secondary | ICD-10-CM

## 2024-01-21 DIAGNOSIS — H6123 Impacted cerumen, bilateral: Secondary | ICD-10-CM

## 2024-01-21 MED ORDER — OLOPATADINE HCL 0.1 % OP SOLN: 1.0000 [drp] | Freq: Two times a day (BID) | OPHTHALMIC | 2 refills | Status: AC

## 2024-01-21 MED ORDER — CIPROFLOXACIN-DEXAMETHASONE 0.3-0.1 % OT SUSP: 4.0000 [drp] | Freq: Two times a day (BID) | OTIC | 0 refills | Status: AC

## 2024-01-21 NOTE — Progress Notes (Signed)
 Name: Matthew Mckay   Date of Visit: 01/21/24   Date of last visit with me: Visit date not found   CHIEF COMPLAINT:  Chief Complaint  Patient presents with   Acute Visit    Possible head cold, experiencing a runny nose and his left ear is ringing.        HPI:  Discussed the use of AI scribe software for clinical note transcription with the patient, who gave verbal consent to proceed.  History of Present Illness Matthew Mckay is an 83 year old male who presents with lightheadedness and ringing in both ears.  He has been experiencing lightheadedness and persistent ringing in both ears for the past few weeks, which affects his hearing and makes it difficult to hear at times. These symptoms began after a period of frequent sneezing and a runny nose, which have since resolved.  He experiences red, watery eyes in the mornings, with occasional itchiness. He uses over-the-counter eye drops, which provide some relief. His eyes often turn red in the mornings, and he experiences watery discharge.  He has a history of a bladder infection for which he was hospitalized in March and is currently using a catheter. He recalls receiving numerous injections during his hospital stay, which was distressing due to his fear of needles. He is taking allergy medication, one pill every evening.  He worked as a Engineer, maintenance for an Clinical biochemist for 24 years, which involved exposure to various weather conditions. He drinks Schaefer beer but has never smoked.     OBJECTIVE:       12/28/2023   11:45 AM  Depression screen PHQ 2/9  Decreased Interest 0  Down, Depressed, Hopeless 0  PHQ - 2 Score 0  Altered sleeping 2  Tired, decreased energy 0  Change in appetite 0  Feeling bad or failure about yourself  0  Trouble concentrating 0  Moving slowly or fidgety/restless 0  Suicidal thoughts 0  PHQ-9 Score 2  Difficult doing work/chores Not difficult at all     BP Readings from Last 3  Encounters:  01/21/24 (!) 140/84  08/20/23 (!) 165/79  07/02/23 (!) 168/67    BP (!) 140/84   Pulse 65   Wt 168 lb 6.4 oz (76.4 kg)   SpO2 97%   BMI 24.87 kg/m    Physical Exam HEENT: Cerumen impaction with cerumen covering the ear canal.  Physical Exam HENT:     Right Ear: External ear normal. There is impacted cerumen.     Left Ear: External ear normal. There is impacted cerumen.     ASSESSMENT/PLAN:   Assessment & Plan Allergic conjunctivitis of both eyes  Bilateral impacted cerumen  Acute otitis externa of left ear, unspecified type    Assessment and Plan Assessment & Plan Impacted cerumen, bilateral Bilateral impacted cerumen causing hearing difficulty and tinnitus. - Bilateral cerumen removal with currete and instrumentation.  - Flush ears to remove wax. - If flushing is ineffective, use ear drops to soften wax and schedule follow-up for further removal.  Chronic Allergic conjunctivitis and allergic rhinitis-uncontrolled Allergic conjunctivitis with red, watery, itchy eyes, especially in the morning. Allergic rhinitis with sneezing and runny nose, likely exacerbated by allergen exposure. - Prescribe olopatadine  drops for morning use.Currently uncontrolled with acute flare up of allergic conjuctivitis. - Continue current allergy xyzal  regimen, one pill every evening.  Otitis externa - Notable otitis externa, will go ahead and do ciprodex drops and f/u in 3 weeks.  Matthew Mckay A. Vita MD [facility]

## 2024-01-21 NOTE — Patient Instructions (Signed)
 Please use the oloptadine  eyedrops in your eyes.  Please use the Ciprodex drops in your left ear.  Please follow instructions on the package.

## 2024-01-25 ENCOUNTER — Ambulatory Visit: Admitting: Medical

## 2024-02-07 ENCOUNTER — Encounter: Payer: Self-pay | Admitting: Family Medicine

## 2024-02-07 ENCOUNTER — Ambulatory Visit (INDEPENDENT_AMBULATORY_CARE_PROVIDER_SITE_OTHER): Admitting: Family Medicine

## 2024-02-07 VITALS — BP 128/84 | HR 64 | Wt 170.4 lb

## 2024-02-07 DIAGNOSIS — Z23 Encounter for immunization: Secondary | ICD-10-CM

## 2024-02-07 DIAGNOSIS — J301 Allergic rhinitis due to pollen: Secondary | ICD-10-CM | POA: Diagnosis not present

## 2024-02-07 DIAGNOSIS — H6123 Impacted cerumen, bilateral: Secondary | ICD-10-CM | POA: Diagnosis not present

## 2024-02-07 DIAGNOSIS — H9203 Otalgia, bilateral: Secondary | ICD-10-CM

## 2024-02-07 MED ORDER — AZELASTINE HCL 0.1 % NA SOLN: 1.0000 | Freq: Two times a day (BID) | NASAL | 12 refills | Status: AC

## 2024-02-07 NOTE — Progress Notes (Signed)
 Name: Matthew Mckay   Date of Visit: 02/07/24   Date of last visit with me: 01/21/2024   CHIEF COMPLAINT:  Chief Complaint  Patient presents with   Follow-up    Follow up form 10/3. Has a cold. Ear drops has been helping.        HPI:  Discussed the use of AI scribe software for clinical note transcription with the patient, who gave verbal consent to proceed.  History of Present Illness   Matthew Mckay is an 83 year old male who presents with earwax buildup and hearing issues.  He has been experiencing tinnitus, which has decreased over time. His hearing has improved with the use of ear drops, although he sometimes wakes up at night with otalgia that resolves upon getting up. He is concerned about the ear drops leaking out and is apprehensive about further ear cleaning due to sensitivity and past discomfort.  He has ongoing allergic symptoms, including sneezing, rhinorrhea, and redness, which he attributes to environmental factors such as the presence of trees around his property. He has been using a nasal spray for years and takes an allergy pill in the evening. His daughter advises him to drink plenty of water and wear a mask when raking leaves.  He uses eye drops to manage itching and redness, which he finds effective. He experiences xerostomia, which he attributes to aging and weather changes.         OBJECTIVE:       12/28/2023   11:45 AM  Depression screen PHQ 2/9  Decreased Interest 0  Down, Depressed, Hopeless 0  PHQ - 2 Score 0  Altered sleeping 2  Tired, decreased energy 0  Change in appetite 0  Feeling bad or failure about yourself  0  Trouble concentrating 0  Moving slowly or fidgety/restless 0  Suicidal thoughts 0  PHQ-9 Score 2  Difficult doing work/chores Not difficult at all     BP Readings from Last 3 Encounters:  02/07/24 128/84  01/21/24 (!) 140/84  08/20/23 (!) 165/79    BP 128/84   Pulse 64   Wt 170 lb 6.4 oz (77.3 kg)   SpO2 98%    BMI 25.16 kg/m    Physical Exam   HEENT: Cerumen impaction in right ear. Cerumen present in left ear.      Physical Exam Constitutional:      Appearance: Normal appearance. He is normal weight.  HENT:     Right Ear: There is impacted cerumen.     Left Ear: There is impacted cerumen.  Neurological:     Mental Status: He is alert.     ASSESSMENT/PLAN:   Assessment & Plan Seasonal allergic rhinitis due to pollen  Ear pain, bilateral  Bilateral impacted cerumen    Assessment and Plan    Impacted cerumen, bilateral ears Bilateral impacted cerumen with improved hearing post-treatment. Reports reduced tinnitus but experiences nocturnal ear pain resolving upon waking. Concerns about sensitivity and discomfort during ear cleaning. - Perform gentle ear cleaning on the right ear. - If unable to remove wax, refer to an otolaryngologist for further removal. - Perform ear cleaning on the left ear after right ear is addressed. - ENT referral a tthis time, patient unable to tolerate irrigation and cerumen disimpaction due to pain and discomfort.   Tinnitus Tinnitus with improved hearing but persistent ringing. Symptoms are less bothersome but still present.  Allergic rhinitis Chronic allergic rhinitis with sneezing, nasal congestion, and rhinorrhea, exacerbated by  environmental allergens. Partial relief with nasal spray and oral antihistamine. - Prescribe additional nasal spray to be used in conjunction with current nasal spray. - Instruct to use one spray of each nasal spray twice daily. - Continue current oral antihistamine regimen.         Reshma Hoey A. Vita MD [facility]

## 2024-02-07 NOTE — Addendum Note (Signed)
 Addended by: EFRAIN, Markelle Asaro D on: 02/07/2024 12:16 PM   Modules accepted: Orders

## 2024-02-07 NOTE — Patient Instructions (Addendum)
 Please use your flonase  daily and your azelastine  one spray each nostril twice a day

## 2024-02-08 ENCOUNTER — Encounter (INDEPENDENT_AMBULATORY_CARE_PROVIDER_SITE_OTHER): Payer: Self-pay

## 2024-02-08 ENCOUNTER — Other Ambulatory Visit: Payer: Self-pay | Admitting: Medical

## 2024-02-12 ENCOUNTER — Other Ambulatory Visit: Payer: Self-pay | Admitting: Medical

## 2024-02-26 ENCOUNTER — Other Ambulatory Visit: Payer: Self-pay | Admitting: Medical

## 2024-02-26 DIAGNOSIS — H1013 Acute atopic conjunctivitis, bilateral: Secondary | ICD-10-CM

## 2024-02-26 DIAGNOSIS — J3089 Other allergic rhinitis: Secondary | ICD-10-CM

## 2024-03-25 ENCOUNTER — Other Ambulatory Visit: Payer: Self-pay | Admitting: Medical

## 2024-04-10 ENCOUNTER — Ambulatory Visit: Admitting: Podiatry

## 2024-04-21 ENCOUNTER — Ambulatory Visit: Admitting: Podiatry

## 2024-04-21 VITALS — Ht 69.0 in | Wt 170.0 lb

## 2024-04-21 DIAGNOSIS — Z7901 Long term (current) use of anticoagulants: Secondary | ICD-10-CM

## 2024-04-21 DIAGNOSIS — M79675 Pain in left toe(s): Secondary | ICD-10-CM | POA: Diagnosis not present

## 2024-04-21 DIAGNOSIS — L84 Corns and callosities: Secondary | ICD-10-CM

## 2024-04-21 DIAGNOSIS — M79674 Pain in right toe(s): Secondary | ICD-10-CM | POA: Diagnosis not present

## 2024-04-21 DIAGNOSIS — B351 Tinea unguium: Secondary | ICD-10-CM | POA: Diagnosis not present

## 2024-04-21 MED ORDER — AMMONIUM LACTATE 12 % EX CREA: 1.0000 | TOPICAL_CREAM | CUTANEOUS | 2 refills | Status: AC | PRN

## 2024-04-21 NOTE — Progress Notes (Signed)
 Subjective:   Patient ID: Matthew Mckay, male   DOB: 84 y.o.   MRN: [id number]   HPI Chief Complaint  Patient presents with   Nail Problem    RM 11 RFC and bilateral callus trim.   84 year old male presents the office today with his daughter for concerns of thick, elongated nails that he is not able to trim himself as well as for painful skin lesions, calluses the bottom of his right foot.  He also has dry skin.  He does not report any ulcerations or any areas of drainage.  No recent treatment otherwise.   Review of Systems  All other systems reviewed and are negative.  Past Medical History:  Diagnosis Date   Allergy    Anxiety    Chronic sinusitis    Diverticulosis    sigmoid colon and increased vascularization due to prior radiation, per 2013 colonoscopy   GERD (gastroesophageal reflux disease)    Hemorrhoid    Hyperlipidemia    Hypertension    Prostate cancer (HCC)    history of radiation therapy   Seasonal allergies    Urinary retention 2019    Past Surgical History:  Procedure Laterality Date   CARDIOVERSION N/A 09/02/2022   Procedure: CARDIOVERSION;  Surgeon: Alvan Ronal BRAVO, MD;  Location: [facility];  Service: Cardiovascular;  Laterality: N/A;   COLONOSCOPY  02/2012   sigmoid diverticulosis, polyps, Dr. Oliva Boots   CYSTOSCOPY WITH URETHRAL DILATATION N/A 02/10/2019   Procedure: CYSTOSCOPY WITH URETHRAL DILATATION,;  Surgeon: Alvaro Hummer, MD;  Location: [facility];  Service: Urology;  Laterality: N/A;   IR CATHETER TUBE CHANGE  08/20/2023   PROSTATECTOMY     1990s    Current Medications[1]  Allergies[2]         Objective:  Physical Exam  General: AAO x3, NAD  Dermatological: Nails are hypertrophic, dystrophic, brittle, discolored, elongated 10. No surrounding redness or drainage. Tenderness nails 1-5 bilaterally.  Dry skin present bilaterally but on the right foot submetatarsal as well as the arch of the foot there are 2 areas of thick  hyperkeratotic lesions consistent with calluses.  There is no underlying ulceration, drainage or any signs of infection.  No open lesions identified otherwise bilaterally.   Vascular: Dorsalis Pedis artery and Posterior Tibial artery pedal pulses are 2/4 bilateral with immedate capillary fill time. There is no pain with calf compression, swelling, warmth, erythema.   Neruologic: Grossly intact via light touch bilateral.   Musculoskeletal: No other areas of discomfort      Assessment:   Symptomatic onychomycosis, hyperkeratotic lesions, dry skin; on Eliquis       Plan:  -Treatment options discussed including all alternatives, risks, and complications -Etiology of symptoms were discussed -Nails debrided 10 without complications or bleeding. -Should be debrided hyperkeratotic lesions x 2 without any complications or bleeding -AmLactin for dry skin prescribed. -Daily foot inspection -Follow-up in 3 months or sooner if any problems arise. In the meantime, encouraged to call the office with any questions, concerns, change in symptoms.   Donnice Fees, DPM         [1]  Current Outpatient Medications:    ammonium lactate  (AMLACTIN) 12 % cream, Apply 1 Application topically as needed for dry skin., Disp: 385 g, Rfl: 2   atorvastatin  (LIPITOR) 10 MG tablet, TAKE 1 TABLET BY MOUTH EVERY DAY, Disp: 90 tablet, Rfl: 1   azelastine  (ASTELIN ) 0.1 % nasal spray, Place 1 spray into both nostrils 2 (two) times daily. Use in  each nostril as directed, Disp: 30 mL, Rfl: 12   Cholecalciferol (VITAMIN D3) 75 MCG (3000 UT) TABS, Take 75 mcg by mouth daily., Disp: , Rfl:    docusate sodium  (COLACE) 100 MG capsule, Take 1 capsule (100 mg total) by mouth 2 (two) times daily., Disp: 10 capsule, Rfl: 0   ELIQUIS  5 MG TABS tablet, TAKE 1 TABLET BY MOUTH TWICE A DAY, Disp: 180 tablet, Rfl: 3   feeding supplement (ENSURE ENLIVE / ENSURE PLUS) LIQD, Take 237 mLs by mouth 2 (two) times daily between meals.,  Disp: 237 mL, Rfl: 12   fluticasone  (FLONASE ) 50 MCG/ACT nasal spray, SPRAY 2 SPRAYS INTO EACH NOSTRIL EVERY DAY, Disp: 48 mL, Rfl: 1   levocetirizine (XYZAL ) 5 MG tablet, TAKE 1 TABLET BY MOUTH EVERY DAY IN THE EVENING, Disp: 90 tablet, Rfl: 0   lisinopril  (ZESTRIL ) 20 MG tablet, TAKE 1 TABLET BY MOUTH EVERY DAY, Disp: 90 tablet, Rfl: 1   metoprolol  succinate (TOPROL -XL) 25 MG 24 hr tablet, TAKE 1 TABLET (25 MG TOTAL) BY MOUTH DAILY. APPT REQ FOR REFILL [id number], Disp: 90 tablet, Rfl: 1   olopatadine  (PATANOL) 0.1 % ophthalmic solution, Place 1 drop into both eyes 2 (two) times daily., Disp: 5 mL, Rfl: 2   Peppermint Oil (IBGARD) 90 MG CPCR, Take 1 tablet by mouth daily. (Patient not taking: Reported on 02/07/2024), Disp: 30 capsule, Rfl: 2   polyethylene glycol (MIRALAX  / GLYCOLAX ) 17 g packet, Take 17 g by mouth daily., Disp: 14 each, Rfl: 0 [2]  Allergies Allergen Reactions   Pollen Extract-Tree Extract [Pollen Extract]

## 2024-04-27 ENCOUNTER — Ambulatory Visit (INDEPENDENT_AMBULATORY_CARE_PROVIDER_SITE_OTHER): Admitting: Family Medicine

## 2024-04-27 VITALS — BP 126/74 | HR 63 | Wt 169.0 lb

## 2024-04-27 DIAGNOSIS — M25561 Pain in right knee: Secondary | ICD-10-CM

## 2024-04-27 MED ORDER — MELOXICAM 15 MG PO TABS: 15.0000 mg | ORAL_TABLET | Freq: Every day | ORAL | 0 refills | Status: AC

## 2024-04-27 NOTE — Progress Notes (Signed)
 "  Name: Matthew Mckay   Date of Visit: 04/27/2024   Date of last visit with me: 02/07/2024   CHIEF COMPLAINT:  Chief Complaint  Patient presents with   Acute Visit    Right knee concerns. Injury happened 2 days ago, was in the floor changing air vents heard a big pop in knee. Now it is double in size, very painful.        HPI:  Discussed the use of AI scribe software for clinical note transcription with the patient, who gave verbal consent to proceed.  History of Present Illness   Matthew Mckay is an 84 year old male who presents with acute right knee pain and swelling. He is accompanied by his daughter, Arlyne.  He has been experiencing acute right knee pain and swelling for two days following an attempt to move a heavy dresser. During the activity, he heard a 'crack' and has since been in significant pain, particularly when touching the floor in the morning. The pain is described as unbearable and is localized to a specific area on the knee. No sensation of the bone being out of place, but there is a cracking noise with movement.  He has been using Voltaren  spray for relief. He has been using Tylenol  for pain management.  He has a history of wearing a catheter, which he sometimes switches from the left to the right side depending on his sleeping position. He generally sleeps on his right side.         OBJECTIVE:       12/28/2023   11:45 AM  Depression screen PHQ 2/9  Decreased Interest 0  Down, Depressed, Hopeless 0  PHQ - 2 Score 0  Altered sleeping 2  Tired, decreased energy 0  Change in appetite 0  Feeling bad or failure about yourself  0  Trouble concentrating 0  Moving slowly or fidgety/restless 0  Suicidal thoughts 0  PHQ-9 Score 2   Difficult doing work/chores Not difficult at all     Data saved with a previous flowsheet row definition     BP Readings from Last 3 Encounters:  04/27/24 126/74  02/07/24 128/84  01/21/24 (!) 140/84    BP 126/74    Pulse 63   Wt 169 lb (76.7 kg)   SpO2 97%   BMI 24.96 kg/m    Physical Exam        Physical Exam   Inspection reveals swelling of the right knee joint in the suprapatellar area.  Tenderness to palpation on the medial lateral joint lines.  Decreased range of motion with flexion extension. ASSESSMENT/PLAN:   Assessment & Plan Acute pain of right knee    Assessment and Plan    Meniscal tear of knee Acute meniscal tear likely degenerative with some degree of trauma. Severe pain, swelling, and popping noise indicate meniscal involvement. Joint narrowing noted. No surgery indicated. - Drained knee fluid and administered steroid injection. - Prescribed meloxicam  daily for two weeks. - Advised against ibuprofen  use with meloxicam . - Instructed to apply ice daily, avoid heat. - Allowed Tylenol  for additional pain relief.     PROCEDURE:  Risks & benefits of right knee aspiration & cortisone injection reviewed. Consent obtained. Time-out completed. Patient prepped and draped in the normal fashion. Area cleansed with chlorhexidene. Ethyl chloride spray used to anesthetize the skin. Solution of 3 mL 1% lidocaine  injected into superior lateral aspect of right knee for local anesthesia.  After ensuring adequate anesthesia an 18-g  1.5-inch needle on 60-mL syringe inserted into right knee via superior-lateral approach.  Aspirated 30 cc of yellow synovial fluid - was not sent for analysis.  Needle left in place, syringe exchanged for syringe with solution of 4 mL 1% lidocaine  with 1 mL methylprednisolone  (Depo-medrol ) 40mg /mL.  This was injected into the right knee without complication. Patient tolerated procedure well without any complications. Area covered with adhesive bandage. Post-procedure care reviewed.  All questions answered.  I personally spent a total of 36 minutes in the care of the patient today including preparing to see the patient, getting/reviewing separately obtained history,  performing a medically appropriate exam/evaluation, counseling and educating, placing orders, referring and communicating with other health care professionals, documenting clinical information in the EHR, independently interpreting results, communicating results, and coordinating care.      Lauryl Seyer A. Vita MD [facility] "

## 2024-04-28 DIAGNOSIS — M25561 Pain in right knee: Secondary | ICD-10-CM | POA: Diagnosis not present

## 2024-04-28 MED ORDER — LIDOCAINE HCL 1 % IJ SOLN
5.0000 mL | Freq: Once | INTRAMUSCULAR | Status: AC
  Administered 2024-04-28: 5 mL via INTRADERMAL

## 2024-04-28 MED ORDER — BUPIVACAINE HCL 0.25 % IJ SOLN
2.0000 mL | Freq: Once | INTRAMUSCULAR | Status: AC
  Administered 2024-04-28: 2 mL

## 2024-04-28 MED ORDER — METHYLPREDNISOLONE ACETATE 40 MG/ML IJ SUSP
40.0000 mg | Freq: Once | INTRAMUSCULAR | Status: AC
  Administered 2024-04-28: 40 mg via INTRAMUSCULAR

## 2024-04-28 NOTE — Addendum Note (Signed)
 Addended by: LATTIE CARLO BROCKS on: 04/28/2024 07:59 AM   Modules accepted: Orders

## 2024-05-19 ENCOUNTER — Emergency Department (HOSPITAL_COMMUNITY)
Admission: EM | Admit: 2024-05-19 | Discharge: 2024-05-19 | Disposition: A | Discharge status: Home / Self Care | Attending: Emergency Medicine | Admitting: Emergency Medicine

## 2024-05-19 ENCOUNTER — Emergency Department (HOSPITAL_COMMUNITY)

## 2024-05-19 DIAGNOSIS — Z7901 Long term (current) use of anticoagulants: Secondary | ICD-10-CM | POA: Insufficient documentation

## 2024-05-19 DIAGNOSIS — R011 Cardiac murmur, unspecified: Secondary | ICD-10-CM | POA: Diagnosis not present

## 2024-05-19 DIAGNOSIS — Z79899 Other long term (current) drug therapy: Secondary | ICD-10-CM | POA: Diagnosis not present

## 2024-05-19 DIAGNOSIS — W01198A Fall on same level from slipping, tripping and stumbling with subsequent striking against other object, initial encounter: Secondary | ICD-10-CM | POA: Insufficient documentation

## 2024-05-19 DIAGNOSIS — R040 Epistaxis: Secondary | ICD-10-CM | POA: Insufficient documentation

## 2024-05-19 DIAGNOSIS — I4891 Unspecified atrial fibrillation: Secondary | ICD-10-CM | POA: Diagnosis not present

## 2024-05-19 DIAGNOSIS — S022XXA Fracture of nasal bones, initial encounter for closed fracture: Secondary | ICD-10-CM | POA: Diagnosis not present

## 2024-05-19 DIAGNOSIS — S0992XA Unspecified injury of nose, initial encounter: Secondary | ICD-10-CM | POA: Diagnosis present

## 2024-05-19 DIAGNOSIS — W19XXXA Unspecified fall, initial encounter: Secondary | ICD-10-CM

## 2024-05-19 DIAGNOSIS — S0990XA Unspecified injury of head, initial encounter: Secondary | ICD-10-CM | POA: Diagnosis not present

## 2024-05-19 LAB — BASIC METABOLIC PANEL WITH GFR
Anion gap: 12 (ref 5–15)
BUN: 23 mg/dL (ref 8–23)
CO2: 23 mmol/L (ref 22–32)
Calcium: 9 mg/dL (ref 8.9–10.3)
Chloride: 103 mmol/L (ref 98–111)
Creatinine, Ser: 1 mg/dL (ref 0.61–1.24)
GFR, Estimated: >60 mL/min
Glucose, Bld: 166 mg/dL — ABNORMAL HIGH (ref 70–99)
Potassium: 3.6 mmol/L (ref 3.5–5.1)
Sodium: 138 mmol/L (ref 135–145)

## 2024-05-19 LAB — CBC WITH DIFFERENTIAL/PLATELET
Abs Immature Granulocytes: 0.03 10*3/uL (ref 0.00–0.07)
Basophils Absolute: 0 10*3/uL (ref 0.0–0.1)
Basophils Relative: 0 %
Eosinophils Absolute: 0.1 10*3/uL (ref 0.0–0.5)
Eosinophils Relative: 1 %
HCT: 46.7 % (ref 39.0–52.0)
Hemoglobin: 15.3 g/dL (ref 13.0–17.0)
Immature Granulocytes: 0 %
Lymphocytes Relative: 19 %
Lymphs Abs: 1.5 10*3/uL (ref 0.7–4.0)
MCH: 29.8 pg (ref 26.0–34.0)
MCHC: 32.8 g/dL (ref 30.0–36.0)
MCV: 90.9 fL (ref 80.0–100.0)
Monocytes Absolute: 0.5 10*3/uL (ref 0.1–1.0)
Monocytes Relative: 6 %
Neutro Abs: 5.9 10*3/uL (ref 1.7–7.7)
Neutrophils Relative %: 74 %
Platelets: 154 10*3/uL (ref 150–400)
RBC: 5.14 MIL/uL (ref 4.22–5.81)
RDW: 14 % (ref 11.5–15.5)
WBC: 8 10*3/uL (ref 4.0–10.5)
nRBC: 0 % (ref 0.0–0.2)

## 2024-05-19 MED ORDER — OXYMETAZOLINE HCL 0.05 % NA SOLN
1.0000 | Freq: Once | NASAL | Status: AC
  Administered 2024-05-19: 1 via NASAL
  Filled 2024-05-19: qty 15

## 2024-05-19 NOTE — Progress Notes (Signed)
 Orthopedic Tech Progress Note Patient Details:  Matthew Mckay [birth year] [id number] Level 2 Trauma. Not needed Patient ID: Matthew Mckay, male   DOB: [birth year], 84 y.o.   MRN: [id number]  Matthew Mckay Cos 05/19/2024, 11:20 AM

## 2024-05-19 NOTE — ED Provider Notes (Signed)
 " [facility] Provider Note   CSN: [id number] Arrival date & time: 05/19/24  1102     Patient presents with: No chief complaint on file.   Matthew Mckay is a 84 y.o. male.  He is brought in by EMS after a slip and fall on the ice.  Hit his face.  Has a nosebleed.  Denies loss of consciousness.  Is on Eliquis  for A-fib.  Denies other injuries or complaints.  No headache neck pain loose teeth.  No chest pain or shortness of breath.   The history is provided by the patient and the EMS personnel.  Fall This is a new problem. The current episode started less than 1 hour ago. The problem has not changed since onset.Pertinent negatives include no chest pain, no abdominal pain, no headaches and no shortness of breath. Nothing aggravates the symptoms. Nothing relieves the symptoms. He has tried nothing for the symptoms. The treatment provided no relief.       Prior to Admission medications  Medication Sig Start Date End Date Taking? Authorizing Provider  ammonium lactate  (AMLACTIN) 12 % cream Apply 1 Application topically as needed for dry skin. 04/21/24   Gershon Donnice SAUNDERS, DPM  atorvastatin  (LIPITOR) 10 MG tablet TAKE 1 TABLET BY MOUTH EVERY DAY 03/27/24   Tysinger, Alm RAMAN, PA-C  azelastine  (ASTELIN ) 0.1 % nasal spray Place 1 spray into both nostrils 2 (two) times daily. Use in each nostril as directed 02/07/24   Jha, Panav, MD  Cholecalciferol (VITAMIN D3) 75 MCG (3000 UT) TABS Take 75 mcg by mouth daily.    [provider]  docusate sodium  (COLACE) 100 MG capsule Take 1 capsule (100 mg total) by mouth 2 (two) times daily. 07/02/23   Maree, Pratik D, DO  ELIQUIS  5 MG TABS tablet TAKE 1 TABLET BY MOUTH TWICE A DAY 11/30/22   Tysinger, Alm RAMAN, PA-C  feeding supplement (ENSURE ENLIVE / ENSURE PLUS) LIQD Take 237 mLs by mouth 2 (two) times daily between meals. 07/02/23   Maree, Pratik D, DO  fluticasone  (FLONASE ) 50 MCG/ACT nasal spray SPRAY 2  SPRAYS INTO EACH NOSTRIL EVERY DAY 02/08/24   Tysinger, Alm RAMAN, PA-C  levocetirizine (XYZAL ) 5 MG tablet TAKE 1 TABLET BY MOUTH EVERY DAY IN THE EVENING 02/28/24   Tysinger, Alm RAMAN, PA-C  lisinopril  (ZESTRIL ) 20 MG tablet TAKE 1 TABLET BY MOUTH EVERY DAY 03/27/24   Tysinger, Alm RAMAN, PA-C  meloxicam  (MOBIC ) 15 MG tablet Take 1 tablet (15 mg total) by mouth daily. 04/27/24   Jha, Panav, MD  metoprolol  succinate (TOPROL -XL) 25 MG 24 hr tablet TAKE 1 TABLET (25 MG TOTAL) BY MOUTH DAILY. APPT REQ FOR REFILL [id number] 02/14/24   Tysinger, Alm RAMAN, PA-C  olopatadine  (PATANOL) 0.1 % ophthalmic solution Place 1 drop into both eyes 2 (two) times daily. 01/21/24   Jha, Panav, MD  Peppermint Oil (IBGARD) 90 MG CPCR Take 1 tablet by mouth daily. Patient not taking: Reported on 04/27/2024 11/30/22   Tysinger, Alm RAMAN, PA-C  polyethylene glycol (MIRALAX  / GLYCOLAX ) 17 g packet Take 17 g by mouth daily. 07/03/23   Maree, Pratik D, DO    Allergies: Pollen extract-tree extract [pollen extract]    Review of Systems  Respiratory:  Negative for shortness of breath.   Cardiovascular:  Negative for chest pain.  Gastrointestinal:  Negative for abdominal pain.  Neurological:  Negative for headaches.    Updated Vital Signs BP (!) 163/83  Pulse 66   Temp 97.9 F (36.6 C) (Axillary)   Resp 17   Ht 5' 9 (1.753 m)   Wt 78.9 kg   SpO2 100%   BMI 25.70 kg/m   Physical Exam Vitals and nursing note reviewed.  Constitutional:      Appearance: He is well-developed.  HENT:     Head: Normocephalic and atraumatic.     Nose:     Comments: Nose is swollen and tender.  He has a nosebleed from the right nares. Eyes:     Conjunctiva/sclera: Conjunctivae normal.  Cardiovascular:     Rate and Rhythm: Normal rate. Rhythm irregular.     Heart sounds: Murmur heard.  Pulmonary:     Effort: Pulmonary effort is normal. No respiratory distress.     Breath sounds: Normal breath sounds.  Abdominal:     Palpations: Abdomen is  soft.     Tenderness: There is no abdominal tenderness.  Musculoskeletal:        General: No deformity. Normal range of motion.     Cervical back: Neck supple. No tenderness.  Skin:    General: Skin is warm and dry.  Neurological:     General: No focal deficit present.     Mental Status: He is alert and oriented to person, place, and time.     GCS: GCS eye subscore is 4. GCS verbal subscore is 5. GCS motor subscore is 6.     Cranial Nerves: No cranial nerve deficit.     Sensory: No sensory deficit.     Motor: No weakness.     (all labs ordered are listed, but only abnormal results are displayed) Labs Reviewed  BASIC METABOLIC PANEL WITH GFR - Abnormal; Notable for the following components:      Result Value   Glucose, Bld 166 (*)    All other components within normal limits  CBC WITH DIFFERENTIAL/PLATELET    EKG: EKG Interpretation Date/Time:  Friday May 19 2024 11:17:45 EST Ventricular Rate:  69 PR Interval:    QRS Duration:  108 QT Interval:  420 QTC Calculation: 450 R Axis:   -1  Text Interpretation: Atrial fibrillation RSR' in V1 or V2, right VCD or RVH Probable left ventricular hypertrophy Confirmed by Towana Sharper [phone]) on 05/19/2024 11:20:40 AM  Radiology: CT Maxillofacial WO CM Result Date: 05/19/2024 EXAM: CT OF THE FACE WITHOUT CONTRAST 05/19/2024 12:14:08 PM TECHNIQUE: CT of the face was performed without the administration of intravenous contrast. Multiplanar reformatted images are provided for review. Automated exposure control, iterative reconstruction, and/or weight based adjustment of the mA/kV was utilized to reduce the radiation dose to as low as reasonably achievable. COMPARISON: None available. CLINICAL HISTORY: Facial trauma, blunt. FINDINGS: FACIAL BONES: There are mildly displaced bilateral nasal bone fractures. There is slight lateral apex angulation of the right nasal bone fracture. No additional acute maxillofacial fracture identified. The  temporomandibular joints are approximated. No suspicious bone lesion. ORBITS: Globes are intact. No acute traumatic injury. No inflammatory change. SINUSES AND MASTOIDS: There is gauze noted within the nostrils and bilateral nasal vestibules. Fluid is present within the right nasal cavity, likely reflecting blood products. There is a somewhat masslike focus of soft tissue within the left nasal cavity measuring up to 0.8 cm, which could reflect a small polyp (series 3, image 16). Recommend nonemergent correlation with direct visualization. There is mucosal thickening and a small air fluid level in the left maxillary sinus. Moderate scattered mucosal thickening is present in the ethmoid  sinuses. There is S-shaped deviation of the nasal septum with rightward convex curvature inferiorly and leftward convex curvature superiorly. No acute abnormality of the mastoids. SOFT TISSUES: No acute abnormality. IMPRESSION: 1. Mildly displaced bilateral nasal bone fractures. 2. Fluid within the right nasal cavity, likely reflecting blood products. 3. Somewhat masslike focus of soft tissue within the left nasal cavity measuring up to 0.8 cm, possibly a small polyp. Recommend nonemergent direct visualization. Electronically signed by: Donnice Mania MD 05/19/2024 12:52 PM EST RP Workstation: [id number]   CT Cervical Spine Wo Contrast Result Date: 05/19/2024 EXAM: CT CERVICAL SPINE WITHOUT CONTRAST 05/19/2024 12:14:08 PM TECHNIQUE: CT of the cervical spine was performed without the administration of intravenous contrast. Multiplanar reformatted images are provided for review. Automated exposure control, iterative reconstruction, and/or weight based adjustment of the mA/kV was utilized to reduce the radiation dose to as low as reasonably achievable. COMPARISON: 06/27/2023 CLINICAL HISTORY: Neck trauma (Age >= 65y). Neck trauma. Age: >=65 years. FINDINGS: BONES AND ALIGNMENT: Straightening and slight reversal of the normal cervical  lordosis. Trace degenerative retrolisthesis of C3 on C4, which appears slightly less pronounced. No acute fracture or traumatic malalignment. DEGENERATIVE CHANGES: Disc osteophyte complexes at multiple levels. No high grade osseous spinal canal stenosis. Facet arthrosis and uncovertebral hypertrophy throughout the cervical spine. Foraminal stenosis is most pronounced on the left at C5-C6. SOFT TISSUES: No prevertebral soft tissue swelling. VASCULATURE: Atherosclerosis at the carotid bifurcations, right greater than left. IMPRESSION: 1. No evidence of acute traumatic injury or traumatic malalignment. Electronically signed by: Donnice Mania MD 05/19/2024 12:46 PM EST RP Workstation: [id number]   CT Head Wo Contrast Result Date: 05/19/2024 EXAM: CT HEAD WITHOUT CONTRAST 05/19/2024 12:14:08 PM TECHNIQUE: CT of the head was performed without the administration of intravenous contrast. Automated exposure control, iterative reconstruction, and/or weight based adjustment of the mA/kV was utilized to reduce the radiation dose to as low as reasonably achievable. COMPARISON: 06/27/2023 CLINICAL HISTORY: Head trauma, minor (age >= 65 years). FINDINGS: BRAIN AND VENTRICLES: No acute hemorrhage. No evidence of acute infarct. Chronic right cerebellar infarct. Scattered periventricular and subcortical white matter low-density changes consistent with chronic microvascular ischemic change. Moderate diffuse cerebral volume loss. Similar appearance of phlebolith hypoattenuating collection over the left cerebral convexity which may reflect a subdural hygroma. No significant mass effect from the possible subdural hygroma. No midline shift. No hydrocephalus. Atherosclerotic calcifications in large vessels of skull base. ORBITS: No acute abnormality. SINUSES: Mild bilateral ethmoid mucosal thickening. SOFT TISSUES AND SKULL: No acute soft tissue abnormality. No skull fracture. IMPRESSION: 1. No acute intracranial abnormality. 2.  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 No significant mass effect. Electronically signed by: Donnice Mania MD 05/19/2024 12:40 PM EST RP Workstation: [id number]     Epistaxis Management  Date/Time: 05/19/2024 11:42 AM  Performed by: Towana Ozell BROCKS, MD Authorized by: Towana Ozell BROCKS, MD   Consent:    Consent obtained:  Verbal   Consent given by:  Patient   Risks, benefits, and alternatives were discussed: yes   Universal protocol:    Patient identity confirmed:  Verbally with patient Anesthesia:    Anesthesia method:  None Comments:     11:40 AM.  Bilateral cotton pledgets soaked in Afrin placed.    Medications Ordered in the ED  oxymetazoline  (AFRIN) 0.05 % nasal spray 1 spray (1 spray Each Nare Given 05/19/24 1118)    Clinical Course as of 05/19/24 1711  Fri May 19, 2024  1142 Patient's  son confirming that the patient is not on Eliquis . [MB]  1358 Removed bilateral cotton pledgets.  He has a little bit of oozing on the right and nothing on the left.  Will continue to monitor it. [MB]    Clinical Course User Index [MB] Towana Ozell BROCKS, MD                                 Medical Decision Making Amount and/or Complexity of Data Reviewed Labs: ordered. Radiology: ordered.  Risk OTC drugs.   This patient complains of fall facial injury nosebleed; this involves an extensive number of treatment Options and is a complaint that carries with it a high risk of complications and morbidity. The differential includes nasal fracture, epistaxis, intracranial bleed  I ordered, reviewed and interpreted labs, which included CBC with stable hemoglobin, chemistries with mildly elevated glucose I ordered medication topical Afrin and reviewed PMP when indicated. I ordered imaging studies which included CT head cervical spine max face and I independently    visualized and interpreted imaging which showed nasal  fracture Additional history obtained from patient's family member Previous records obtained and reviewed in epic including outpatient PCP notes Cardiac monitoring reviewed, sinus rhythm Social determinants considered, no significant barriers Critical Interventions: None  After the interventions stated above, I reevaluated the patient and found his nosebleed is significantly slowed and mostly stopped Admission and further testing considered, no indications for admission.  He and family are comfortable plan for discharge and symptomatic treatment.  Given contact information for outpatient ENT.  Return instructions discussed.      Final diagnoses:  Fall, initial encounter  Injury of head, initial encounter  Closed fracture of nasal bone, initial encounter  Right-sided epistaxis    ED Discharge Orders     None          Towana Ozell BROCKS, MD 05/19/24 1713  "

## 2024-05-19 NOTE — ED Triage Notes (Signed)
 FOT bibems from home. Pt was walking in the yard for mail, had mechanical fall. Pt slipped on ice and fell face first on ice, hitting his nose. Taking eliquis . Hx of afib.   BP 168/90 HR 78 98% RA

## 2024-05-19 NOTE — Discharge Instructions (Signed)
 You were seen in the emergency department after a fall.  Your CAT scans showed a fracture of your nose.  There were no other fractures or significant bleeding noted in your head or neck.  Please apply ice to your face 20 minutes on 20 minutes off while awake.  Tylenol  for pain.  Avoid touching or blowing nose.  Follow-up with your primary care doctor and ear nose throat doctor.  Return if any worsening or concerning symptoms

## 2024-05-19 NOTE — ED Notes (Incomplete)
 Trauma Response Nurse Documentation   Matthew Mckay is a 84 y.o. male arriving to [facility] ED via [facility]  EMS  On No antithrombotic.- daughter states that pt's son who is a engineer, civil (consulting) in [place] says pt is not on a thinner. 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 Trauma was activated as a Level 2 by Charge RN based on the following trauma criteria Elderly patients > 65 with head trauma on anti-coagulation (excluding ASA).  Patient cleared for CT by Dr. Towana. Pt transported to CT with Primary nurse present to monitor. RN remained with the patient throughout their absence from the department for clinical observation.   GCS 15.  History   Past Medical History:  Diagnosis Date   Allergy    Anxiety    Chronic sinusitis    Diverticulosis    sigmoid colon and increased vascularization due to prior radiation, per 2013 colonoscopy   GERD (gastroesophageal reflux disease)    Hemorrhoid    Hyperlipidemia    Hypertension    Prostate cancer (HCC)    history of radiation therapy   Seasonal allergies    Urinary retention 2019     Past Surgical History:  Procedure Laterality Date   CARDIOVERSION N/A 09/02/2022   Procedure: CARDIOVERSION;  Surgeon: Alvan Ronal BRAVO, MD;  Location: [facility] INVASIVE CV [facility];  Service: Cardiovascular;  Laterality: N/A;   COLONOSCOPY  02/2012   sigmoid diverticulosis, polyps, Dr. Oliva Boots   CYSTOSCOPY WITH URETHRAL DILATATION N/A 02/10/2019   Procedure: CYSTOSCOPY WITH URETHRAL DILATATION,;  Surgeon: Alvaro Hummer, MD;  Location: [facility] ORS;  Service: Urology;  Laterality: N/A;   IR CATHETER TUBE CHANGE  08/20/2023   PROSTATECTOMY     1990s       Initial Focused Assessment (If applicable, or please see trauma documentation): Airway - clear Breathing - unlabored- states is unable to breathe through nose due to allergies and now bleeding from right nares Circulation - continuous bleeding from right nares, a continuous trickle - states that he can feel the blood going down his  throat. GCS - 15 --   CT's Completed:   CT Head, CT Maxillofacial, and CT C-Spine   Interventions:  Labs Xrays CT scans   Plan for disposition:  {Trauma Dispo:26867}   Consults completed:  {Trauma Consults:26862} at ***.  Event Summary:  Was walking to his mailbox and slipped, landing on nose-- no other injuries. Bleeding continuously, small amount from right nares.  Pt lives by himself, daughter lives next door. Son is a engineer, civil (consulting) in [place].    Darice CHRISTELLA Rouleau  Trauma Response RN  Please call TRN at [phone] for further assistance.

## 2024-07-20 ENCOUNTER — Ambulatory Visit: Admitting: Podiatry

## 2025-01-02 ENCOUNTER — Ambulatory Visit: Payer: Self-pay
